# Patient Record
Sex: Male | Born: 1960 | Race: White | Hispanic: No | Marital: Married | Smoking: Never smoker
Health system: Southern US, Community
[De-identification: ages and names within clinical notes are randomized; demographics above are authoritative.]

## PROBLEM LIST (undated history)

## (undated) DIAGNOSIS — M1990 Unspecified osteoarthritis, unspecified site: Secondary | ICD-10-CM

## (undated) DIAGNOSIS — M549 Dorsalgia, unspecified: Secondary | ICD-10-CM

## (undated) DIAGNOSIS — I10 Essential (primary) hypertension: Secondary | ICD-10-CM

## (undated) DIAGNOSIS — K219 Gastro-esophageal reflux disease without esophagitis: Secondary | ICD-10-CM

## (undated) DIAGNOSIS — K7030 Alcoholic cirrhosis of liver without ascites: Secondary | ICD-10-CM

## (undated) DIAGNOSIS — F419 Anxiety disorder, unspecified: Secondary | ICD-10-CM

## (undated) DIAGNOSIS — I81 Portal vein thrombosis: Secondary | ICD-10-CM

## (undated) DIAGNOSIS — Z9109 Other allergy status, other than to drugs and biological substances: Secondary | ICD-10-CM

## (undated) DIAGNOSIS — E039 Hypothyroidism, unspecified: Secondary | ICD-10-CM

## (undated) DIAGNOSIS — F1011 Alcohol abuse, in remission: Secondary | ICD-10-CM

## (undated) DIAGNOSIS — I2510 Atherosclerotic heart disease of native coronary artery without angina pectoris: Secondary | ICD-10-CM

## (undated) DIAGNOSIS — E079 Disorder of thyroid, unspecified: Secondary | ICD-10-CM

## (undated) HISTORY — PX: ANKLE FRACTURE SURGERY: SHX122

## (undated) HISTORY — PX: FRACTURE SURGERY: SHX138

## (undated) HISTORY — PX: EYE SURGERY: SHX253

---

## 2009-06-10 ENCOUNTER — Ambulatory Visit: Payer: Self-pay | Admitting: Family Medicine

## 2009-06-13 ENCOUNTER — Ambulatory Visit: Payer: Self-pay | Admitting: Family Medicine

## 2009-07-21 ENCOUNTER — Ambulatory Visit: Payer: Self-pay | Admitting: Family Medicine

## 2010-12-14 ENCOUNTER — Ambulatory Visit: Payer: Self-pay | Admitting: Nephrology

## 2012-02-02 DIAGNOSIS — I81 Portal vein thrombosis: Secondary | ICD-10-CM

## 2012-02-02 HISTORY — DX: Portal vein thrombosis: I81

## 2012-04-17 ENCOUNTER — Ambulatory Visit: Payer: Self-pay | Admitting: Family Medicine

## 2012-04-19 ENCOUNTER — Ambulatory Visit: Payer: Self-pay | Admitting: Family Medicine

## 2012-05-30 DIAGNOSIS — K219 Gastro-esophageal reflux disease without esophagitis: Secondary | ICD-10-CM | POA: Diagnosis present

## 2012-05-30 DIAGNOSIS — K7030 Alcoholic cirrhosis of liver without ascites: Secondary | ICD-10-CM | POA: Diagnosis present

## 2012-05-30 DIAGNOSIS — I10 Essential (primary) hypertension: Secondary | ICD-10-CM | POA: Diagnosis present

## 2012-05-30 DIAGNOSIS — E785 Hyperlipidemia, unspecified: Secondary | ICD-10-CM | POA: Insufficient documentation

## 2012-05-30 DIAGNOSIS — E039 Hypothyroidism, unspecified: Secondary | ICD-10-CM | POA: Diagnosis present

## 2012-10-27 ENCOUNTER — Ambulatory Visit: Payer: Self-pay | Admitting: Family Medicine

## 2012-12-18 DIAGNOSIS — I81 Portal vein thrombosis: Secondary | ICD-10-CM | POA: Diagnosis present

## 2013-04-25 ENCOUNTER — Ambulatory Visit: Payer: Self-pay | Admitting: Physician Assistant

## 2014-03-14 ENCOUNTER — Ambulatory Visit: Payer: Self-pay | Admitting: Family Medicine

## 2014-05-01 DIAGNOSIS — I8500 Esophageal varices without bleeding: Secondary | ICD-10-CM | POA: Diagnosis present

## 2015-01-14 ENCOUNTER — Other Ambulatory Visit: Payer: Self-pay | Admitting: Orthopedic Surgery

## 2015-01-14 DIAGNOSIS — M2391 Unspecified internal derangement of right knee: Secondary | ICD-10-CM

## 2015-01-14 DIAGNOSIS — M25562 Pain in left knee: Principal | ICD-10-CM

## 2015-01-14 DIAGNOSIS — G8929 Other chronic pain: Secondary | ICD-10-CM

## 2015-01-14 DIAGNOSIS — M2392 Unspecified internal derangement of left knee: Secondary | ICD-10-CM

## 2015-01-14 DIAGNOSIS — M25561 Pain in right knee: Secondary | ICD-10-CM

## 2015-02-06 ENCOUNTER — Ambulatory Visit: Payer: Self-pay

## 2015-02-21 ENCOUNTER — Encounter
Admission: RE | Admit: 2015-02-21 | Discharge: 2015-02-21 | Disposition: A | Discharge status: Home / Self Care | Payer: BLUE CROSS/BLUE SHIELD | Source: Ambulatory Visit | Attending: Unknown Physician Specialty | Admitting: Unknown Physician Specialty

## 2015-02-21 DIAGNOSIS — Z01812 Encounter for preprocedural laboratory examination: Secondary | ICD-10-CM | POA: Insufficient documentation

## 2015-02-21 DIAGNOSIS — Z01810 Encounter for preprocedural cardiovascular examination: Secondary | ICD-10-CM | POA: Insufficient documentation

## 2015-02-21 DIAGNOSIS — I10 Essential (primary) hypertension: Secondary | ICD-10-CM | POA: Diagnosis not present

## 2015-02-21 HISTORY — DX: Portal vein thrombosis: I81

## 2015-02-21 HISTORY — DX: Alcohol abuse, in remission: F10.11

## 2015-02-21 HISTORY — DX: Disorder of thyroid, unspecified: E07.9

## 2015-02-21 HISTORY — DX: Atherosclerotic heart disease of native coronary artery without angina pectoris: I25.10

## 2015-02-21 HISTORY — DX: Gastro-esophageal reflux disease without esophagitis: K21.9

## 2015-02-21 HISTORY — DX: Alcoholic cirrhosis of liver without ascites: K70.30

## 2015-02-21 HISTORY — DX: Essential (primary) hypertension: I10

## 2015-02-21 HISTORY — DX: Unspecified osteoarthritis, unspecified site: M19.90

## 2015-02-21 HISTORY — DX: Anxiety disorder, unspecified: F41.9

## 2015-02-21 LAB — CBC
HCT: 44.1 % (ref 40.0–52.0)
Hemoglobin: 14.6 g/dL (ref 13.0–18.0)
MCH: 31.1 pg (ref 26.0–34.0)
MCHC: 33.1 g/dL (ref 32.0–36.0)
MCV: 93.9 fL (ref 80.0–100.0)
PLATELETS: 96 10*3/uL — AB (ref 150–440)
RBC: 4.7 MIL/uL (ref 4.40–5.90)
RDW: 14.4 % (ref 11.5–14.5)
WBC: 5 10*3/uL (ref 3.8–10.6)

## 2015-02-21 LAB — BASIC METABOLIC PANEL
Anion gap: 5 (ref 5–15)
BUN: 12 mg/dL (ref 6–20)
CO2: 23 mmol/L (ref 22–32)
CREATININE: 0.89 mg/dL (ref 0.61–1.24)
Calcium: 8.9 mg/dL (ref 8.9–10.3)
Chloride: 112 mmol/L — ABNORMAL HIGH (ref 101–111)
GFR calc Af Amer: >60 mL/min (ref >60)
Glucose, Bld: 84 mg/dL (ref 65–99)
Potassium: 4.1 mmol/L (ref 3.5–5.1)
SODIUM: 140 mmol/L (ref 135–145)

## 2015-02-21 LAB — HEPATIC FUNCTION PANEL
ALT: 26 U/L (ref 17–63)
AST: 33 U/L (ref 15–41)
Albumin: 3.4 g/dL — ABNORMAL LOW (ref 3.5–5.0)
Alkaline Phosphatase: 77 U/L (ref 38–126)
BILIRUBIN INDIRECT: 1.5 mg/dL — AB (ref 0.3–0.9)
Bilirubin, Direct: 0.3 mg/dL (ref 0.1–0.5)
TOTAL PROTEIN: 6.4 g/dL — AB (ref 6.5–8.1)
Total Bilirubin: 1.8 mg/dL — ABNORMAL HIGH (ref 0.3–1.2)

## 2015-02-21 NOTE — Pre-Procedure Instructions (Signed)
Dr. Andree Elk notified of patient medical history and medical clearance requested.

## 2015-02-21 NOTE — Pre-Procedure Instructions (Signed)
Dr. Franchot Mimes, Jr. office notified of medical clearance request (spoke to [facility]).

## 2015-02-21 NOTE — Patient Instructions (Signed)
  Your procedure is scheduled [id number] 25, 2917 (Wednesday) Report to Day Surgery.[facility]) Second Floor To find out your arrival time please call [phone] between 1PM - 3PM on February 25, 2015 (Tuesday).  Remember: Instructions that are not followed completely may result in serious medical risk, up to and including death, or upon the discretion of your surgeon and anesthesiologist your surgery may need to be rescheduled.    __x__ 1. Do not eat food or drink liquids after midnight. No gum chewing or hard candies.     __x__ 2. No Alcohol for 24 hours before or after surgery.   ____ 3. Bring all medications with you on the day of surgery if instructed.    __x__ 4. Notify your doctor if there is any change in your medical condition     (cold, fever, infections).     Do not wear jewelry, make-up, hairpins, clips or nail polish.  Do not wear lotions, powders, or perfumes. You may wear deodorant.  Do not shave 48 hours prior to surgery. Men may shave face and neck.  Do not bring valuables to the hospital.    [facility] is not responsible for any belongings or valuables.               Contacts, dentures or bridgework may not be worn into surgery.  Leave your suitcase in the car. After surgery it may be brought to your room.  For patients admitted to the hospital, discharge time is determined by your                treatment team.   Patients discharged the day of surgery will not be allowed to drive home.   Please read over the following fact sheets that you were given:   Surgical Site Infection Prevention   ____ Take these medicines the morning of surgery with A SIP OF WATER:    1. Gabapentin  2. Magnesium  3.   4.  5.  6.  ____ Fleet Enema (as directed)   __x__ Use CHG Soap as directed  ____ Use inhalers on the day of surgery  ____ Stop metformin 2 days prior to surgery    ____ Take 1/2 of usual insulin dose the night before surgery and none on the morning of  surgery.   ____ Stop Coumadin/Plavix/aspirin on   ____ Stop Anti-inflammatories on    ____ Stop supplements until after surgery.    ____ Bring C-Pap to the hospital.

## 2015-02-24 NOTE — Pre-Procedure Instructions (Signed)
NOTE RECEIVED FROM DR [facility] THAT PATIENT NEEDS CLEARANCE FROM HEPATOLOGIST. SPOKE [place] WILL TRY TO GET NOTE.

## 2015-02-25 NOTE — Pre-Procedure Instructions (Signed)
Medical clearance received from Dr. Claybon Jabs, [place].

## 2015-02-26 ENCOUNTER — Ambulatory Visit
Admission: RE | Admit: 2015-02-26 | Discharge: 2015-02-26 | Disposition: A | Discharge status: Home / Self Care | Payer: BLUE CROSS/BLUE SHIELD | Source: Ambulatory Visit | Attending: Unknown Physician Specialty | Admitting: Unknown Physician Specialty

## 2015-02-26 ENCOUNTER — Encounter: Payer: Self-pay | Admitting: *Deleted

## 2015-02-26 ENCOUNTER — Ambulatory Visit: Payer: BLUE CROSS/BLUE SHIELD | Admitting: Anesthesiology

## 2015-02-26 ENCOUNTER — Encounter
Admission: RE | Disposition: A | Discharge status: Home / Self Care | Payer: Self-pay | Source: Ambulatory Visit | Attending: Unknown Physician Specialty

## 2015-02-26 DIAGNOSIS — E785 Hyperlipidemia, unspecified: Secondary | ICD-10-CM | POA: Diagnosis not present

## 2015-02-26 DIAGNOSIS — M23232 Derangement of other medial meniscus due to old tear or injury, left knee: Secondary | ICD-10-CM | POA: Insufficient documentation

## 2015-02-26 DIAGNOSIS — F1010 Alcohol abuse, uncomplicated: Secondary | ICD-10-CM | POA: Insufficient documentation

## 2015-02-26 DIAGNOSIS — K7030 Alcoholic cirrhosis of liver without ascites: Secondary | ICD-10-CM | POA: Insufficient documentation

## 2015-02-26 DIAGNOSIS — Z79899 Other long term (current) drug therapy: Secondary | ICD-10-CM | POA: Diagnosis not present

## 2015-02-26 DIAGNOSIS — I10 Essential (primary) hypertension: Secondary | ICD-10-CM | POA: Insufficient documentation

## 2015-02-26 DIAGNOSIS — Z9841 Cataract extraction status, right eye: Secondary | ICD-10-CM | POA: Insufficient documentation

## 2015-02-26 DIAGNOSIS — Z8249 Family history of ischemic heart disease and other diseases of the circulatory system: Secondary | ICD-10-CM | POA: Diagnosis not present

## 2015-02-26 DIAGNOSIS — M1990 Unspecified osteoarthritis, unspecified site: Secondary | ICD-10-CM | POA: Insufficient documentation

## 2015-02-26 DIAGNOSIS — F419 Anxiety disorder, unspecified: Secondary | ICD-10-CM | POA: Diagnosis not present

## 2015-02-26 DIAGNOSIS — I2510 Atherosclerotic heart disease of native coronary artery without angina pectoris: Secondary | ICD-10-CM | POA: Diagnosis not present

## 2015-02-26 DIAGNOSIS — S83242A Other tear of medial meniscus, current injury, left knee, initial encounter: Secondary | ICD-10-CM | POA: Diagnosis present

## 2015-02-26 DIAGNOSIS — K219 Gastro-esophageal reflux disease without esophagitis: Secondary | ICD-10-CM | POA: Diagnosis not present

## 2015-02-26 DIAGNOSIS — M23222 Derangement of posterior horn of medial meniscus due to old tear or injury, left knee: Secondary | ICD-10-CM | POA: Diagnosis not present

## 2015-02-26 DIAGNOSIS — Z86718 Personal history of other venous thrombosis and embolism: Secondary | ICD-10-CM | POA: Insufficient documentation

## 2015-02-26 DIAGNOSIS — E079 Disorder of thyroid, unspecified: Secondary | ICD-10-CM | POA: Diagnosis not present

## 2015-02-26 DIAGNOSIS — Z8261 Family history of arthritis: Secondary | ICD-10-CM | POA: Diagnosis not present

## 2015-02-26 DIAGNOSIS — E039 Hypothyroidism, unspecified: Secondary | ICD-10-CM | POA: Insufficient documentation

## 2015-02-26 HISTORY — PX: KNEE ARTHROSCOPY: SHX127

## 2015-02-26 SURGERY — ARTHROSCOPY, KNEE
Anesthesia: General | Site: Knee | Laterality: Left | Wound class: Clean

## 2015-02-26 MED ORDER — BUPIVACAINE HCL (PF) 0.5 % IJ SOLN
INTRAMUSCULAR | Status: AC
  Filled 2015-02-26: qty 30

## 2015-02-26 MED ORDER — FENTANYL CITRATE (PF) 100 MCG/2ML IJ SOLN: 25.0000 ug | INTRAMUSCULAR | Status: DC | PRN

## 2015-02-26 MED ORDER — BUPIVACAINE HCL (PF) 0.5 % IJ SOLN
INTRAMUSCULAR | Status: DC | PRN
Start: 2015-02-26 — End: 2015-02-26
  Administered 2015-02-26: 20 mL

## 2015-02-26 MED ORDER — HYDROCODONE-ACETAMINOPHEN 5-325 MG PO TABS
ORAL_TABLET | ORAL | Status: AC
  Filled 2015-02-26: qty 1

## 2015-02-26 MED ORDER — FENTANYL CITRATE (PF) 100 MCG/2ML IJ SOLN
INTRAMUSCULAR | Status: DC | PRN
  Administered 2015-02-26: 50 ug via INTRAVENOUS

## 2015-02-26 MED ORDER — HYDROCODONE-ACETAMINOPHEN 5-325 MG PO TABS
1.0000 | ORAL_TABLET | Freq: Four times a day (QID) | ORAL | Status: DC | PRN
  Administered 2015-02-26: 1 via ORAL

## 2015-02-26 MED ORDER — ONDANSETRON HCL 4 MG/2ML IJ SOLN
INTRAMUSCULAR | Status: DC | PRN
  Administered 2015-02-26: 4 mg via INTRAVENOUS

## 2015-02-26 MED ORDER — ACETAMINOPHEN 10 MG/ML IV SOLN
INTRAVENOUS | Status: DC | PRN
  Administered 2015-02-26: 1000 mg via INTRAVENOUS

## 2015-02-26 MED ORDER — EPHEDRINE SULFATE 50 MG/ML IJ SOLN
INTRAMUSCULAR | Status: DC | PRN
  Administered 2015-02-26: 10 mg via INTRAVENOUS

## 2015-02-26 MED ORDER — FAMOTIDINE 20 MG PO TABS
ORAL_TABLET | ORAL | Status: AC
  Administered 2015-02-26: 20 mg via ORAL
  Filled 2015-02-26: qty 1

## 2015-02-26 MED ORDER — LIDOCAINE HCL (CARDIAC) 20 MG/ML IV SOLN
INTRAVENOUS | Status: DC | PRN
  Administered 2015-02-26: 60 mg via INTRAVENOUS

## 2015-02-26 MED ORDER — MIDAZOLAM HCL 5 MG/5ML IJ SOLN
INTRAMUSCULAR | Status: DC | PRN
  Administered 2015-02-26: 2 mg via INTRAVENOUS

## 2015-02-26 MED ORDER — NORCO 5-325 MG PO TABS: 1.0000 | ORAL_TABLET | Freq: Four times a day (QID) | ORAL | Status: DC | PRN

## 2015-02-26 MED ORDER — LACTATED RINGERS IV SOLN
INTRAVENOUS | Status: DC
  Administered 2015-02-26: 07:00:00 via INTRAVENOUS

## 2015-02-26 MED ORDER — FAMOTIDINE 20 MG PO TABS
20.0000 mg | ORAL_TABLET | Freq: Once | ORAL | Status: AC
  Administered 2015-02-26: 20 mg via ORAL

## 2015-02-26 MED ORDER — PROMETHAZINE HCL 25 MG/ML IJ SOLN: 6.2500 mg | INTRAMUSCULAR | Status: DC | PRN

## 2015-02-26 MED ORDER — ACETAMINOPHEN 10 MG/ML IV SOLN
INTRAVENOUS | Status: AC
  Filled 2015-02-26: qty 100

## 2015-02-26 SURGICAL SUPPLY — 38 items
ARTHROWAND PARAGON T2 (SURGICAL WAND)
BLADE ABRADER 4.5 (BLADE) IMPLANT
BLADE FULL RADIUS 3.5 (BLADE) ×2 IMPLANT
BLADE SHAVER 4.5X7 STR FR (MISCELLANEOUS) ×2 IMPLANT
BNDG ESMARK 6X12 TAN STRL LF (GAUZE/BANDAGES/DRESSINGS) ×2 IMPLANT
BUR ABRADER 4.0 W/FLUTE AQUA (MISCELLANEOUS) IMPLANT
BURR ABRADER 4.0 W/FLUTE AQUA (MISCELLANEOUS)
CHLORAPREP W/TINT 26ML (MISCELLANEOUS) ×2 IMPLANT
DRAPE LEGGINS SURG 28X43 STRL (DRAPES) ×2 IMPLANT
GAUZE SPONGE 4X4 12PLY STRL (GAUZE/BANDAGES/DRESSINGS) ×2 IMPLANT
GLOVE BIO SURGEON STRL SZ7.5 (GLOVE) ×2 IMPLANT
GLOVE BIO SURGEON STRL SZ8 (GLOVE) ×2 IMPLANT
GLOVE INDICATOR 8.0 STRL GRN (GLOVE) ×2 IMPLANT
GOWN STRL REUS W/ TWL LRG LVL3 (GOWN DISPOSABLE) ×1 IMPLANT
GOWN STRL REUS W/TWL LRG LVL3 (GOWN DISPOSABLE) ×1
GOWN STRL REUS W/TWL LRG LVL4 (GOWN DISPOSABLE) ×2 IMPLANT
IV LACTATED RINGER IRRG 3000ML (IV SOLUTION) ×2
IV LR IRRIG 3000ML ARTHROMATIC (IV SOLUTION) ×2 IMPLANT
KIT RM TURNOVER STRD PROC AR (KITS) ×2 IMPLANT
MANIFOLD 4PT FOR NEPTUNE1 (MISCELLANEOUS) ×2 IMPLANT
PACK ARTHROSCOPY KNEE (MISCELLANEOUS) ×2 IMPLANT
PADDING CAST 4IN STRL (MISCELLANEOUS)
PADDING CAST BLEND 4X4 STRL (MISCELLANEOUS) IMPLANT
SET TUBE SUCT SHAVER OUTFL 24K (TUBING) ×2 IMPLANT
SET TUBE TIP INTRA-ARTICULAR (MISCELLANEOUS) ×2 IMPLANT
SOL PREP PVP 2OZ (MISCELLANEOUS) ×2
SOLUTION PREP PVP 2OZ (MISCELLANEOUS) ×1 IMPLANT
SUT ETH BLK MONO 3 0 FS 1 12/B (SUTURE) ×2 IMPLANT
SUT ETHILON 3-0 FS-10 30 BLK (SUTURE) ×2
SUTURE EHLN 3-0 FS-10 30 BLK (SUTURE) ×1 IMPLANT
TAPE MICROFOAM 4IN (TAPE) ×2 IMPLANT
TUBING ARTHRO INFLOW-ONLY STRL (TUBING) ×2 IMPLANT
WAND 30 DEG SABER W/CORD (SURGICAL WAND) IMPLANT
WAND ARTHRO PARAGON T2 (SURGICAL WAND) IMPLANT
WAND COVAC 50 IFS (MISCELLANEOUS) IMPLANT
WAND HAND CNTRL MULTIVAC 50 (MISCELLANEOUS) IMPLANT
WAND HAND CNTRL MULTIVAC 90 (MISCELLANEOUS) IMPLANT
WRAP KNEE W/COLD PACKS 25.5X14 (SOFTGOODS) ×2 IMPLANT

## 2015-02-26 NOTE — Op Note (Signed)
Patient: Shaun Weaver, Shaun Weaver  Preoperative diagnosis: Torn medial meniscus left knee  Postop diagnosis: Same  Operation: Arthroscopic partial medial meniscectomy left knee  Surgeon: Vilinda Flake, MD  Anesthesia: Gen.   History: Patient's had a long history of left knee pain.  The plain films revealed minimal joint space narrowing medially on the left .  The patient had an MRI which revealed a torn medial meniscus.The patient was scheduled for surgery due to persistent discomfort despite conservative treatment.  The patient was taken the operating room where satisfactory general anesthesia was achieved. A tourniquet and leg holder were was applied to the left thigh. A well leg support was applied to the nonoperative extremity. The left knee was prepped and draped in usual fashion for an arthroscopic procedure. An inflow cannula was introduced superomedially. The joint was distended with lactated Ringer's. Scope was introduced through an inferolateral puncture wound and a probe through an inferomedial puncture wound. Inspection of the medial compartment revealed  a complex tear of the posterior horn and middle third of the medial meniscus. I went ahead and resected the tear using combination of basket biters and a motorized resector. The patient did not have any medial compartment chondral lesions. Inspection of the intercondylar notch revealed some attenuation of the anterior cruciate ligament. Inspection of the the lateral compartment revealed no meniscal are chondral pathology.   Trochlear groove was inspected and appeared to be fairly smooth.  Retropatellar surface was smooth. The patella seemed to track fairly well.  The instruments were removed from the joint at this time. The puncture wounds were closed with 3-0 nylon in vertical mattress fashion. I injected each puncture wound with several cc of half percent Marcaine without epinephrine. Betadine was applied the wounds followed by sterile  dressing. An ice pack was applied to the right knee. The patient was awakened and transferred to the stretcher bed. The patient was taken to the recovery room in satisfactory condition.  The tourniquet was not inflated during the course of the procedure. Blood loss was negligible.

## 2015-02-26 NOTE — Anesthesia Procedure Notes (Signed)
Procedure Name: LMA Insertion Date/Time: 02/26/2015 7:45 AM Performed by: Delaney Meigs Pre-anesthesia Checklist: Patient identified, Emergency Drugs available, Suction available, Patient being monitored and Timeout performed Patient Re-evaluated:Patient Re-evaluated prior to inductionOxygen Delivery Method: Circle system utilized and Simple face mask Preoxygenation: Pre-oxygenation with 100% oxygen Intubation Type: IV induction Ventilation: Mask ventilation without difficulty LMA Size: 4.5 Number of attempts: 1 Placement Confirmation: breath sounds checked- equal and bilateral and positive ETCO2 Tube secured with: Tape

## 2015-02-26 NOTE — H&P (Signed)
  H and P reviewed. No changes. Uploaded at later date. 

## 2015-02-26 NOTE — Anesthesia Preprocedure Evaluation (Signed)
Anesthesia Evaluation  Patient identified by MRN, date of birth, ID band Patient awake    Reviewed: Allergy & Precautions, H&P , NPO status , Patient's Chart, lab work & pertinent test results, reviewed documented beta blocker date and time   History of Anesthesia Complications Negative for: history of anesthetic complications  Airway Mallampati: I  TM Distance: >3 FB Neck ROM: full    Dental no notable dental hx. (+) Missing, Teeth Intact   Pulmonary neg pulmonary ROS,    Pulmonary exam normal breath sounds clear to auscultation       Cardiovascular Exercise Tolerance: Good hypertension, (-) angina+ CAD  (-) Past MI, (-) Cardiac Stents and (-) CABG Normal cardiovascular exam(-) dysrhythmias (-) Valvular Problems/Murmurs Rhythm:regular Rate:Normal     Neuro/Psych negative neurological ROS  negative psych ROS   GI/Hepatic GERD  ,(+) Cirrhosis       ,   Endo/Other  neg diabetesHypothyroidism   Renal/GU negative Renal ROS  negative genitourinary   Musculoskeletal   Abdominal   Peds  Hematology negative hematology ROS (+)   Anesthesia Other Findings Past Medical History:   Anxiety                                                      H/O alcohol abuse                                            Arthritis                                                    Cirrhosis, alcoholic (HCC)                                   Coronary artery disease                                      GERD (gastroesophageal reflux disease)                       Hypertension                                                 Thyroid disease                                              Deep vein thrombosis of portal vein             2014           Comment:[facility]   Reproductive/Obstetrics negative OB ROS  Anesthesia Physical Anesthesia Plan  ASA: III  Anesthesia Plan: General    Post-op Pain Management:    Induction:   Airway Management Planned:   Additional Equipment:   Intra-op Plan:   Post-operative Plan:   Informed Consent: I have reviewed the patients History and Physical, chart, labs and discussed the procedure including the risks, benefits and alternatives for the proposed anesthesia with the patient or authorized representative who has indicated his/her understanding and acceptance.   Dental Advisory Given  Plan Discussed with: Anesthesiologist, CRNA and Surgeon  Anesthesia Plan Comments:         Anesthesia Quick Evaluation

## 2015-02-26 NOTE — Discharge Instructions (Signed)
° °  [facility]  °Kylea Berrong B. Braden Cimo, Jr., M.D. [phone]  ° °KNEE ARTHROSCOPY POST OPERATION INSTRUCTIONS: ° °PLEASE READ THESE INSTRUCTIONS ABOUT POST OPERATION CARE. THEY WILL ANSWER MOST OF YOUR QUESTIONS.  °You have been given a prescription for pain. Please take as directed for pain.  °You can walk, keeping the knee slightly stiff-avoid doing too much bending the first day. (if ACL reconstruction is performed, keep brace locked in extension when walking.)  °You will use crutches or cane if needed. Can weight bear as tolerated  °Plan to take three to four days off from work. You can resume work when you are comfortable. (This can be a week or more, depending on the type of work you do.)  °To reduce pain and swelling, place one to two pillows under the knee the first two or three days when sitting or lying. An ice pack may be placed on top of the area over the dressing. Instructions for making homemade icepack are as follow:  °Flexible homemade alcohol water ice pack  °2 cups water  °1 cup rubbing alcohol  °food coloring for the blue tint (optional)  °2 zip-top bags - gallon-size  °Mix the water and alcohol together in one of your zip-top bags and add food coloring. Release as much air as possible and seal the bag. Place in freezer for at least 12 hours.  °The small incisions in your knee are closed with nylon stitches. They will be removed in the office.  °The bulky dressing may be removed in the third day after surgery. (If ACL surgery-DO NOT REMOVE BANDAGES). Put a waterproof band-aid over each stitch. Do not put any creams or ointments on wounds. You may shower at this time, but change waterproof band-aids after showering. KEEP INCISIONS CLEAN AND DRY UNTIL YOU RETURN TO THE OFFICE.  °Sometimes the operative area remains somewhat painful and swollen for several weeks. This is usually nothing to worry about, but call if you have any excessive symptoms, especially  fever. It is not unusual to have a low grade fever of 99 degrees for the first few days. If persist after 3-4 days call the office. It is not uncommon for the pain to be a little worse on the third day after surgery.  °Begin doing gentle exercises right away. They will be limited by the amount of pain and swelling you have.  Exercising will reduce the swelling, increase motion, and prevent muscle weakness. Exercises: Straight leg raising and gentle knee bending.  °Take 81 milligram aspirin twice a day for 2 weeks after meals or milk. This along with elevation will help reduce the possibility of phlebitis in your operated leg.  °Avoid strenuous athletics for a minimum of 4 to 6 weeks after arthroscopic surgery (approximately five months if ACL surgery).  °If the surgery included ACL reconstruction the brace that is supplied to the extremity post surgery is to be locked in extension when you are asleep and is to be locked in extension when you are ambulating. It can be unlocked for exercises or sitting.  °Keep your post surgery appointment that has been made for you. If you do not remember the date call [phone]. Your follow up appointment should be between 7-10 days.  ° °

## 2015-02-26 NOTE — Transfer of Care (Signed)
Immediate Anesthesia Transfer of Care Note  Patient: Pryor Curia Caniglia  Procedure(s) Performed: Procedure(s): ARTHROSCOPY Left  KNEE partial medial menisectomy (Left)  Patient Location: PACU  Anesthesia Type:General  Level of Consciousness: sedated  Airway & Oxygen Therapy: Patient Spontanous Breathing and Patient connected to face mask oxygen  Post-op Assessment: Report given to RN and Post -op Vital signs reviewed and stable  Post vital signs: Reviewed and stable  Last Vitals:  Filed Vitals:   02/26/15 0606 02/26/15 0850  BP: 127/76 113/78  Pulse: 58 54  Temp: 36.5 C 36.3 C  Resp: 18 10    Complications: No apparent anesthesia complications

## 2015-02-27 NOTE — Anesthesia Postprocedure Evaluation (Signed)
Anesthesia Post Note  Patient: Shaun Weaver  Procedure(s) Performed: Procedure(s) (LRB): ARTHROSCOPY Left  KNEE partial medial menisectomy (Left)  Patient location during evaluation: PACU Anesthesia Type: General Level of consciousness: awake and alert Pain management: pain level controlled Vital Signs Assessment: post-procedure vital signs reviewed and stable Respiratory status: spontaneous breathing, nonlabored ventilation, respiratory function stable and patient connected to nasal cannula oxygen Cardiovascular status: blood pressure returned to baseline and stable Postop Assessment: no signs of nausea or vomiting Anesthetic complications: no    Last Vitals:  Filed Vitals:   02/26/15 0948 02/26/15 1000  BP: 124/87 120/75  Pulse: 55 61  Temp: 35.9 C   Resp: 16     Last Pain:  Filed Vitals:   02/26/15 1019  PainSc: 6                  Martha Clan

## 2015-03-28 ENCOUNTER — Encounter
Admission: RE | Admit: 2015-03-28 | Discharge: 2015-03-28 | Disposition: A | Discharge status: Home / Self Care | Payer: BLUE CROSS/BLUE SHIELD | Source: Ambulatory Visit | Attending: Unknown Physician Specialty | Admitting: Unknown Physician Specialty

## 2015-03-28 DIAGNOSIS — E079 Disorder of thyroid, unspecified: Secondary | ICD-10-CM | POA: Diagnosis not present

## 2015-03-28 DIAGNOSIS — X58XXXA Exposure to other specified factors, initial encounter: Secondary | ICD-10-CM | POA: Diagnosis not present

## 2015-03-28 DIAGNOSIS — Z8261 Family history of arthritis: Secondary | ICD-10-CM | POA: Diagnosis not present

## 2015-03-28 DIAGNOSIS — I2510 Atherosclerotic heart disease of native coronary artery without angina pectoris: Secondary | ICD-10-CM | POA: Diagnosis not present

## 2015-03-28 DIAGNOSIS — S83241A Other tear of medial meniscus, current injury, right knee, initial encounter: Secondary | ICD-10-CM | POA: Diagnosis not present

## 2015-03-28 DIAGNOSIS — M2391 Unspecified internal derangement of right knee: Secondary | ICD-10-CM | POA: Diagnosis not present

## 2015-03-28 DIAGNOSIS — E785 Hyperlipidemia, unspecified: Secondary | ICD-10-CM | POA: Diagnosis not present

## 2015-03-28 DIAGNOSIS — Z86718 Personal history of other venous thrombosis and embolism: Secondary | ICD-10-CM | POA: Diagnosis not present

## 2015-03-28 DIAGNOSIS — F419 Anxiety disorder, unspecified: Secondary | ICD-10-CM | POA: Diagnosis not present

## 2015-03-28 DIAGNOSIS — Z8249 Family history of ischemic heart disease and other diseases of the circulatory system: Secondary | ICD-10-CM | POA: Diagnosis not present

## 2015-03-28 DIAGNOSIS — K219 Gastro-esophageal reflux disease without esophagitis: Secondary | ICD-10-CM | POA: Diagnosis not present

## 2015-03-28 DIAGNOSIS — Y939 Activity, unspecified: Secondary | ICD-10-CM | POA: Diagnosis not present

## 2015-03-28 DIAGNOSIS — Z9841 Cataract extraction status, right eye: Secondary | ICD-10-CM | POA: Diagnosis not present

## 2015-03-28 DIAGNOSIS — K7460 Unspecified cirrhosis of liver: Secondary | ICD-10-CM | POA: Diagnosis not present

## 2015-03-28 DIAGNOSIS — M1990 Unspecified osteoarthritis, unspecified site: Secondary | ICD-10-CM | POA: Diagnosis not present

## 2015-03-28 DIAGNOSIS — I10 Essential (primary) hypertension: Secondary | ICD-10-CM | POA: Diagnosis not present

## 2015-03-28 DIAGNOSIS — Z79899 Other long term (current) drug therapy: Secondary | ICD-10-CM | POA: Diagnosis not present

## 2015-03-28 HISTORY — DX: Other allergy status, other than to drugs and biological substances: Z91.09

## 2015-03-28 HISTORY — DX: Dorsalgia, unspecified: M54.9

## 2015-03-28 HISTORY — DX: Hypothyroidism, unspecified: E03.9

## 2015-03-28 LAB — POTASSIUM: POTASSIUM: 4.1 mmol/L (ref 3.5–5.1)

## 2015-03-28 NOTE — Patient Instructions (Addendum)
  Your procedure is scheduled on: 03/31/15 Mon Report to Day Surgery.2nd floor medical mall at 9:15 am on Mon. .  Remember: Instructions that are not followed completely may result in serious medical risk, up to and including death, or upon the discretion of your surgeon and anesthesiologist your surgery may need to be rescheduled.    __x__ 1. Do not eat food or drink liquids after midnight. No gum chewing or hard candies.     ____ 2. No Alcohol for 24 hours before or after surgery.   ____ 3. Bring all medications with you on the day of surgery if instructed.    __x__ 4. Notify your doctor if there is any change in your medical condition     (cold, fever, infections).     Do not wear jewelry, make-up, hairpins, clips or nail polish.  Do not wear lotions, powders, or perfumes. You may wear deodorant.  Do not shave 48 hours prior to surgery. Men may shave face and neck.  Do not bring valuables to the hospital.    [facility] is not responsible for any belongings or valuables.               Contacts, dentures or bridgework may not be worn into surgery.  Leave your suitcase in the car. After surgery it may be brought to your room.  For patients admitted to the hospital, discharge time is determined by your                treatment team.   Patients discharged the day of surgery will not be allowed to drive home.   Please read over the following fact sheets that you were given:      _x___ Take these medicines the morning of surgery with A SIP OF WATER:    1. gabapentin (NEURONTIN) 300 MG capsule   2. levothyroxine (SYNTHROID, LEVOTHROID) 100 MCG tablet  3. nadolol (CORGARD) 40 MG tablet  4.omeprazole (PRILOSEC) 20 MG capsule  5.  6.  ____ Fleet Enema (as directed)   _x___ Use CHG Soap as directed  ____ Use inhalers on the day of surgery  ____ Stop metformin 2 days prior to surgery    ____ Take 1/2 of usual insulin dose the night before surgery and none on the morning of  surgery.   ____ Stop Coumadin/Plavix/aspirin on   __x__ Stop Anti-inflammatories on No aspirin or ibuprofen until after surgery may take Tylenol as needed   ____ Stop supplements until after surgery.    ____ Bring C-Pap to the hospital.

## 2015-03-31 ENCOUNTER — Ambulatory Visit
Admission: RE | Admit: 2015-03-31 | Discharge: 2015-03-31 | Disposition: A | Discharge status: Home / Self Care | Payer: BLUE CROSS/BLUE SHIELD | Source: Ambulatory Visit | Attending: Unknown Physician Specialty | Admitting: Unknown Physician Specialty

## 2015-03-31 ENCOUNTER — Encounter: Payer: Self-pay | Admitting: *Deleted

## 2015-03-31 ENCOUNTER — Ambulatory Visit: Payer: BLUE CROSS/BLUE SHIELD | Admitting: Anesthesiology

## 2015-03-31 ENCOUNTER — Encounter
Admission: RE | Disposition: A | Discharge status: Home / Self Care | Payer: Self-pay | Source: Ambulatory Visit | Attending: Unknown Physician Specialty

## 2015-03-31 DIAGNOSIS — M2391 Unspecified internal derangement of right knee: Secondary | ICD-10-CM | POA: Insufficient documentation

## 2015-03-31 DIAGNOSIS — Z9841 Cataract extraction status, right eye: Secondary | ICD-10-CM | POA: Insufficient documentation

## 2015-03-31 DIAGNOSIS — E079 Disorder of thyroid, unspecified: Secondary | ICD-10-CM | POA: Insufficient documentation

## 2015-03-31 DIAGNOSIS — S83241A Other tear of medial meniscus, current injury, right knee, initial encounter: Secondary | ICD-10-CM | POA: Diagnosis not present

## 2015-03-31 DIAGNOSIS — Z8249 Family history of ischemic heart disease and other diseases of the circulatory system: Secondary | ICD-10-CM | POA: Insufficient documentation

## 2015-03-31 DIAGNOSIS — I10 Essential (primary) hypertension: Secondary | ICD-10-CM | POA: Insufficient documentation

## 2015-03-31 DIAGNOSIS — I2510 Atherosclerotic heart disease of native coronary artery without angina pectoris: Secondary | ICD-10-CM | POA: Insufficient documentation

## 2015-03-31 DIAGNOSIS — Z8261 Family history of arthritis: Secondary | ICD-10-CM | POA: Insufficient documentation

## 2015-03-31 DIAGNOSIS — K219 Gastro-esophageal reflux disease without esophagitis: Secondary | ICD-10-CM | POA: Insufficient documentation

## 2015-03-31 DIAGNOSIS — X58XXXA Exposure to other specified factors, initial encounter: Secondary | ICD-10-CM | POA: Insufficient documentation

## 2015-03-31 DIAGNOSIS — F419 Anxiety disorder, unspecified: Secondary | ICD-10-CM | POA: Insufficient documentation

## 2015-03-31 DIAGNOSIS — Z79899 Other long term (current) drug therapy: Secondary | ICD-10-CM | POA: Insufficient documentation

## 2015-03-31 DIAGNOSIS — Z86718 Personal history of other venous thrombosis and embolism: Secondary | ICD-10-CM | POA: Insufficient documentation

## 2015-03-31 DIAGNOSIS — M1990 Unspecified osteoarthritis, unspecified site: Secondary | ICD-10-CM | POA: Insufficient documentation

## 2015-03-31 DIAGNOSIS — Y939 Activity, unspecified: Secondary | ICD-10-CM | POA: Insufficient documentation

## 2015-03-31 DIAGNOSIS — K7460 Unspecified cirrhosis of liver: Secondary | ICD-10-CM | POA: Insufficient documentation

## 2015-03-31 DIAGNOSIS — E785 Hyperlipidemia, unspecified: Secondary | ICD-10-CM | POA: Insufficient documentation

## 2015-03-31 HISTORY — PX: KNEE ARTHROSCOPY: SHX127

## 2015-03-31 SURGERY — ARTHROSCOPY, KNEE
Anesthesia: General | Laterality: Right

## 2015-03-31 MED ORDER — FENTANYL CITRATE (PF) 100 MCG/2ML IJ SOLN
INTRAMUSCULAR | Status: DC | PRN
  Administered 2015-03-31: 100 ug via INTRAVENOUS

## 2015-03-31 MED ORDER — DEXAMETHASONE SODIUM PHOSPHATE 10 MG/ML IJ SOLN
INTRAMUSCULAR | Status: DC | PRN
  Administered 2015-03-31: 5 mg via INTRAVENOUS

## 2015-03-31 MED ORDER — NORCO 5-325 MG PO TABS: 1.0000 | ORAL_TABLET | Freq: Four times a day (QID) | ORAL | Status: DC | PRN

## 2015-03-31 MED ORDER — EPHEDRINE SULFATE 50 MG/ML IJ SOLN
INTRAMUSCULAR | Status: DC | PRN
  Administered 2015-03-31: 10 mg via INTRAVENOUS

## 2015-03-31 MED ORDER — MIDAZOLAM HCL 5 MG/5ML IJ SOLN
INTRAMUSCULAR | Status: DC | PRN
  Administered 2015-03-31: 2 mg via INTRAVENOUS

## 2015-03-31 MED ORDER — LACTATED RINGERS IV SOLN
INTRAVENOUS | Status: DC
  Administered 2015-03-31: 09:00:00 via INTRAVENOUS

## 2015-03-31 MED ORDER — BUPIVACAINE HCL (PF) 0.5 % IJ SOLN
INTRAMUSCULAR | Status: AC
  Filled 2015-03-31: qty 30

## 2015-03-31 MED ORDER — ONDANSETRON HCL 4 MG/2ML IJ SOLN
INTRAMUSCULAR | Status: DC | PRN
  Administered 2015-03-31: 4 mg via INTRAVENOUS

## 2015-03-31 MED ORDER — FENTANYL CITRATE (PF) 100 MCG/2ML IJ SOLN
INTRAMUSCULAR | Status: DC
Start: 2015-03-31 — End: 2015-03-31
  Filled 2015-03-31: qty 2

## 2015-03-31 MED ORDER — PROPOFOL 10 MG/ML IV BOLUS
INTRAVENOUS | Status: DC | PRN
  Administered 2015-03-31: 180 mg via INTRAVENOUS

## 2015-03-31 MED ORDER — BUPIVACAINE HCL (PF) 0.5 % IJ SOLN
INTRAMUSCULAR | Status: DC | PRN
  Administered 2015-03-31: 17 mL

## 2015-03-31 MED ORDER — FENTANYL CITRATE (PF) 100 MCG/2ML IJ SOLN
25.0000 ug | INTRAMUSCULAR | Status: DC | PRN
  Administered 2015-03-31 (×4): 25 ug via INTRAVENOUS

## 2015-03-31 MED ORDER — HYDROCODONE-ACETAMINOPHEN 5-325 MG PO TABS
ORAL_TABLET | ORAL | Status: AC
  Administered 2015-03-31: 1 via ORAL
  Filled 2015-03-31: qty 1

## 2015-03-31 MED ORDER — ONDANSETRON HCL 4 MG/2ML IJ SOLN: 4.0000 mg | Freq: Once | INTRAMUSCULAR | Status: DC | PRN

## 2015-03-31 MED ORDER — HYDROCODONE-ACETAMINOPHEN 5-325 MG PO TABS
1.0000 | ORAL_TABLET | Freq: Four times a day (QID) | ORAL | Status: DC | PRN
  Administered 2015-03-31: 1 via ORAL

## 2015-03-31 SURGICAL SUPPLY — 38 items
ARTHROWAND PARAGON T2 (SURGICAL WAND) ×3
BLADE ABRADER 4.5 (BLADE) IMPLANT
BLADE FULL RADIUS 3.5 (BLADE) ×3 IMPLANT
BLADE SHAVER 4.5X7 STR FR (MISCELLANEOUS) ×3 IMPLANT
BNDG ESMARK 6X12 TAN STRL LF (GAUZE/BANDAGES/DRESSINGS) ×3 IMPLANT
BUR ABRADER 4.0 W/FLUTE AQUA (MISCELLANEOUS) ×1 IMPLANT
BURR ABRADER 4.0 W/FLUTE AQUA (MISCELLANEOUS) ×3
CHLORAPREP W/TINT 26ML (MISCELLANEOUS) ×3 IMPLANT
DRAPE LEGGINS SURG 28X43 STRL (DRAPES) ×3 IMPLANT
GAUZE SPONGE 4X4 12PLY STRL (GAUZE/BANDAGES/DRESSINGS) ×3 IMPLANT
GLOVE BIO SURGEON STRL SZ7.5 (GLOVE) ×3 IMPLANT
GLOVE BIO SURGEON STRL SZ8 (GLOVE) ×3 IMPLANT
GLOVE INDICATOR 8.0 STRL GRN (GLOVE) ×3 IMPLANT
GOWN STRL REUS W/ TWL LRG LVL3 (GOWN DISPOSABLE) ×1 IMPLANT
GOWN STRL REUS W/TWL LRG LVL3 (GOWN DISPOSABLE) ×2
GOWN STRL REUS W/TWL LRG LVL4 (GOWN DISPOSABLE) ×3 IMPLANT
IV LACTATED RINGER IRRG 3000ML (IV SOLUTION) ×8
IV LR IRRIG 3000ML ARTHROMATIC (IV SOLUTION) ×4 IMPLANT
KIT RM TURNOVER STRD PROC AR (KITS) ×3 IMPLANT
MANIFOLD 4PT FOR NEPTUNE1 (MISCELLANEOUS) ×3 IMPLANT
PACK ARTHROSCOPY KNEE (MISCELLANEOUS) ×3 IMPLANT
PADDING CAST 4IN STRL (MISCELLANEOUS)
PADDING CAST BLEND 4X4 STRL (MISCELLANEOUS) IMPLANT
SET TUBE SUCT SHAVER OUTFL 24K (TUBING) ×3 IMPLANT
SET TUBE TIP INTRA-ARTICULAR (MISCELLANEOUS) ×3 IMPLANT
SOL PREP PVP 2OZ (MISCELLANEOUS) ×3
SOLUTION PREP PVP 2OZ (MISCELLANEOUS) ×1 IMPLANT
SUT ETH BLK MONO 3 0 FS 1 12/B (SUTURE) ×3 IMPLANT
SUT ETHILON 3-0 FS-10 30 BLK (SUTURE) ×3
SUTURE EHLN 3-0 FS-10 30 BLK (SUTURE) ×1 IMPLANT
TAPE MICROFOAM 4IN (TAPE) ×3 IMPLANT
TUBING ARTHRO INFLOW-ONLY STRL (TUBING) ×3 IMPLANT
WAND 30 DEG SABER W/CORD (SURGICAL WAND) ×3 IMPLANT
WAND ARTHRO PARAGON T2 (SURGICAL WAND) ×1 IMPLANT
WAND COVAC 50 IFS (MISCELLANEOUS) IMPLANT
WAND HAND CNTRL MULTIVAC 50 (MISCELLANEOUS) IMPLANT
WAND HAND CNTRL MULTIVAC 90 (MISCELLANEOUS) ×3 IMPLANT
WRAP KNEE W/COLD PACKS 25.5X14 (SOFTGOODS) ×3 IMPLANT

## 2015-03-31 NOTE — Transfer of Care (Signed)
Immediate Anesthesia Transfer of Care Note  Patient: Shaun Weaver  Procedure(s) Performed: Procedure(s): ARTHROSCOPY KNEE (Right)  Patient Location: PACU  Anesthesia Type:General  Level of Consciousness: awake, alert  and oriented  Airway & Oxygen Therapy: Patient Spontanous Breathing and Patient connected to face mask oxygen  Post-op Assessment: Report given to RN and Post -op Vital signs reviewed and stable  Post vital signs: Reviewed and stable  Last Vitals:  Filed Vitals:   03/31/15 0848 03/31/15 1153  BP: 124/81 131/87  Pulse: 58   Temp: 36.7 C 36.6 C  Resp: 18 14    Complications: No apparent anesthesia complications

## 2015-03-31 NOTE — H&P (Signed)
  H and P reviewed. No changes. Uploaded at later date. 

## 2015-03-31 NOTE — Discharge Instructions (Signed)
° °[facility]  °Harold B. Shaun, Jr., M.D. [phone]  ° °KNEE ARTHROSCOPY POST OPERATION INSTRUCTIONS: ° °PLEASE READ THESE INSTRUCTIONS ABOUT POST OPERATION CARE. THEY WILL ANSWER MOST OF YOUR QUESTIONS.  °You have been given a prescription for pain. Please take as directed for pain.  °You can walk, keeping the knee slightly stiff-avoid doing too much bending the first day. (if ACL reconstruction is performed, keep brace locked in extension when walking.)  °You will use crutches or cane if needed. Can weight bear as tolerated  °Plan to take three to four days off from work. You can resume work when you are comfortable. (This can be a week or more, depending on the type of work you do.)  °To reduce pain and swelling, place one to two pillows under the knee the first two or three days when sitting or lying. An ice pack may be placed on top of the area over the dressing. Instructions for making homemade icepack are as follow:  °Flexible homemade alcohol water ice pack  °2 cups water  °1 cup rubbing alcohol  °food coloring for the blue tint (optional)  °2 zip-top bags - gallon-size  °Mix the water and alcohol together in one of your zip-top bags and add food coloring. Release as much air as possible and seal the bag. Place in freezer for at least 12 hours.  °The small incisions in your knee are closed with nylon stitches. They will be removed in the office.  °The bulky dressing may be removed in the third day after surgery. (If ACL surgery-DO NOT REMOVE BANDAGES). Put a waterproof band-aid over each stitch. Do not put any creams or ointments on wounds. You may shower at this time, but change waterproof band-aids after showering. KEEP INCISIONS CLEAN AND DRY UNTIL YOU RETURN TO THE OFFICE.  °Sometimes the operative area remains somewhat painful and swollen for several weeks. This is usually nothing to worry about, but call if you have any excessive symptoms, especially  fever. It is not unusual to have a low grade fever of 99 degrees for the first few days. If persist after 3-4 days call the office. It is not uncommon for the pain to be a little worse on the third day after surgery.  °Begin doing gentle exercises right away. They will be limited by the amount of pain and swelling you have.  Exercising will reduce the swelling, increase motion, and prevent muscle weakness. Exercises: Straight leg raising and gentle knee bending.  °Take 81 milligram aspirin twice a day for 2 weeks after meals or milk. This along with elevation will help reduce the possibility of phlebitis in your operated leg.  °Avoid strenuous athletics for a minimum of 4 to 6 weeks after arthroscopic surgery (approximately five months if ACL surgery).  °If the surgery included ACL reconstruction the brace that is supplied to the extremity post surgery is to be locked in extension when you are asleep and is to be locked in extension when you are ambulating. It can be unlocked for exercises or sitting.  °Keep your post surgery appointment that has been made for you. If you do not remember the date call [phone]. Your follow up appointment should be between 7-10 days.  ° ° ° °AMBULATORY SURGERY  °DISCHARGE INSTRUCTIONS ° ° °1) The drugs that you were given will stay in your system until tomorrow so for the next 24 hours you should not: ° °A) Drive an automobile °B) Make any   legal decisions °C) Drink any alcoholic beverage ° ° °2) You may resume regular meals tomorrow.  Today it is better to start with liquids and gradually work up to solid foods. ° °You may eat anything you prefer, but it is better to start with liquids, then soup and crackers, and gradually work up to solid foods. ° ° °3) Please notify your doctor immediately if you have any unusual bleeding, trouble breathing, redness and pain at the surgery site, drainage, fever, or pain not relieved by medication. ° ° ° °4) Additional  Instructions: ° ° ° ° ° ° ° °Please contact your physician with any problems or Same Day Surgery at [phone], Monday through Friday 6 am to 4 pm, or [place] [place] Main number at [phone]. °

## 2015-03-31 NOTE — Op Note (Signed)
Patient: Shaun Weaver, Shaun Weaver.  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   History: Patient's had a long history of right knee pain.  The plain films revealed no significant joint space narrowing .  The patient had an MRI which revealed a torn medial meniscus with early medial compartment chondral thinning.The patient was scheduled for surgery due to persistent discomfort despite conservative treatment.  The patient was taken the operating room where satisfactory general anesthesia was achieved. A tourniquet and leg holder were was applied to the right thigh. A well leg support was applied to the nonoperative extremity. The right knee was prepped and draped in usual fashion for an arthroscopic procedure. An inflow cannula was introduced superomedially. The joint was distended with lactated Ringer's. Scope was introduced through an inferolateral puncture wound and a probe through an inferomedial puncture wound. Inspection of the medial compartment revealed  what appeared to be remote horizontal tear of the posterior horn of the medial meniscus with an inferior leaf that had ruptured and attached itself to the anterior horn of the medial meniscus. I went ahead and resected the torn fragment using a combination of a motorized resector and an angled ArthroCare wand. I did notice that the patient had some grade 2 chondral changes in the medial tibial plateau. These were more medially located. Inspection of the intercondylar notch revealed intact cruciates. Inspection of the the lateral compartment revealed no meniscal or chondral pathology.   Trochlear groove was  inspected and revealed grade 2 chondral changes. I went ahead and coblated the trochlear groove lesion with an ArthroCare [place] wand.  Retropatellar surface was smooth. The patella seemed to track fairly well.  The instruments were removed from the joint at this time. The puncture wounds were closed with 3-0 nylon in vertical mattress fashion. I injected each puncture wound with several cc of half percent Marcaine without epinephrine. Betadine was applied the wounds followed by sterile dressing. An ice pack was applied to the right knee. The patient was awakened and transferred to the stretcher bed. The patient was taken to the recovery room in satisfactory condition.  The tourniquet was not inflated during the course of the procedure. Blood loss was negligible.

## 2015-03-31 NOTE — Anesthesia Preprocedure Evaluation (Addendum)
Anesthesia Evaluation  Patient identified by MRN, date of birth, ID band Patient awake    Reviewed: Allergy & Precautions, H&P , NPO status , Patient's Chart, lab work & pertinent test results, reviewed documented beta blocker date and time   History of Anesthesia Complications Negative for: history of anesthetic complications  Airway Mallampati: I  TM Distance: >3 FB Neck ROM: full    Dental no notable dental hx. (+) Missing, Teeth Intact   Pulmonary neg pulmonary ROS,    Pulmonary exam normal breath sounds clear to auscultation       Cardiovascular Exercise Tolerance: Good hypertension, Pt. on medications and Pt. on home beta blockers (-) angina+ CAD  (-) Past MI, (-) Cardiac Stents and (-) CABG Normal cardiovascular exam(-) dysrhythmias (-) Valvular Problems/Murmurs Rhythm:regular Rate:Normal     Neuro/Psych Anxiety negative neurological ROS  negative psych ROS   GI/Hepatic GERD  Medicated and Controlled,(+) Cirrhosis       , Hx of DVT of portal vein   Endo/Other  neg diabetesHypothyroidism   Renal/GU negative Renal ROS  negative genitourinary   Musculoskeletal  (+) Arthritis , Osteoarthritis,    Abdominal Normal abdominal exam  (+)   Peds  Hematology negative hematology ROS (+)   Anesthesia Other Findings Past Medical History:   Anxiety                                                      H/O alcohol abuse                                            Arthritis                                                    Cirrhosis, alcoholic (HCC)                                   Coronary artery disease                                      GERD (gastroesophageal reflux disease)                       Hypertension                                                 Thyroid disease                                              Deep vein thrombosis of portal vein             2014  Comment:[facility]   Reproductive/Obstetrics                            Anesthesia Physical  Anesthesia Plan  ASA: III  Anesthesia Plan: General   Post-op Pain Management:    Induction: Intravenous  Airway Management Planned: LMA  Additional Equipment:   Intra-op Plan:   Post-operative Plan: Extubation in OR  Informed Consent: I have reviewed the patients History and Physical, chart, labs and discussed the procedure including the risks, benefits and alternatives for the proposed anesthesia with the patient or authorized representative who has indicated his/her understanding and acceptance.   Dental Advisory Given and Dental advisory given  Plan Discussed with: Anesthesiologist, CRNA and Surgeon  Anesthesia Plan Comments:        Anesthesia Quick Evaluation

## 2015-04-02 NOTE — Anesthesia Postprocedure Evaluation (Signed)
Anesthesia Post Note  Patient: Shaun Weaver  Procedure(s) Performed: Procedure(s) (LRB): ARTHROSCOPY KNEE (Right)  Patient location during evaluation: PACU Anesthesia Type: General Level of consciousness: awake and alert and oriented Pain management: pain level controlled Vital Signs Assessment: post-procedure vital signs reviewed and stable Respiratory status: spontaneous breathing Cardiovascular status: blood pressure returned to baseline Anesthetic complications: no    Last Vitals:  Filed Vitals:   03/31/15 1239 03/31/15 1330  BP: 146/92 136/68  Pulse: 58 67  Temp: 36.5 C   Resp: 18 16    Last Pain:  Filed Vitals:   04/01/15 0835  PainSc: 0-No pain                 Jaxie Racanelli

## 2016-07-24 DIAGNOSIS — H33021 Retinal detachment with multiple breaks, right eye: Secondary | ICD-10-CM | POA: Insufficient documentation

## 2016-09-28 ENCOUNTER — Ambulatory Visit (INDEPENDENT_AMBULATORY_CARE_PROVIDER_SITE_OTHER): Payer: Self-pay

## 2016-09-28 ENCOUNTER — Ambulatory Visit
Admission: EM | Admit: 2016-09-28 | Discharge: 2016-09-28 | Disposition: A | Discharge status: Home / Self Care | Payer: Self-pay | Attending: Family Medicine | Admitting: Family Medicine

## 2016-09-28 DIAGNOSIS — S60031A Contusion of right middle finger without damage to nail, initial encounter: Secondary | ICD-10-CM

## 2016-09-28 DIAGNOSIS — W268XXA Contact with other sharp object(s), not elsewhere classified, initial encounter: Secondary | ICD-10-CM

## 2016-09-28 DIAGNOSIS — S61212A Laceration without foreign body of right middle finger without damage to nail, initial encounter: Secondary | ICD-10-CM

## 2016-09-28 DIAGNOSIS — Z23 Encounter for immunization: Secondary | ICD-10-CM

## 2016-09-28 MED ORDER — CEPHALEXIN 500 MG PO CAPS: 500.0000 mg | ORAL_CAPSULE | Freq: Four times a day (QID) | ORAL | 0 refills | Status: AC

## 2016-09-28 MED ORDER — MUPIROCIN 2 % EX OINT: TOPICAL_OINTMENT | CUTANEOUS | 0 refills | Status: DC

## 2016-09-28 MED ORDER — TETANUS-DIPHTH-ACELL PERTUSSIS 5-2.5-18.5 LF-MCG/0.5 IM SUSP
0.5000 mL | Freq: Once | INTRAMUSCULAR | Status: AC
  Administered 2016-09-28: 0.5 mL via INTRAMUSCULAR

## 2016-09-28 MED ORDER — CEPHALEXIN 500 MG PO CAPS: 500.0000 mg | ORAL_CAPSULE | Freq: Four times a day (QID) | ORAL | 0 refills | Status: DC

## 2016-09-28 NOTE — ED Provider Notes (Signed)
[facility] ____________________________________________  Time seen: Approximately 9:54 PM  I have reviewed the triage vital signs and the nursing notes.   HISTORY  Chief Complaint Laceration  HPI Shaun Weaver is a 56 y.o. male  presenting for evaluation of right middle finger pain at laceration that occurred at approximately 3 PM today. Patient reports that he was working outside on a hay baler, and states when he went to drop the new cover his finger got caught and was cut by the metal flap. Patient states that he had to complete his job so he applied a Band-Aid and continued to work. States mild pain directly at laceration site. Denies other pain or injury. Denies decreased range of motion, paresthesias or other complaints. Reports right hand dominant. No over-the-counter medications taken for the same complaint. No other alleviating measures attempted. Reports otherwise feels well.    Denies recent sickness. Denies recent antibiotic use. Reports last tetanus immunization 6 years ago.   Sofie Hartigan, MD: PCP    Past Medical History:  Diagnosis Date  . Anxiety   . Arthritis   . Back pain   . Cirrhosis, alcoholic ([place])   . Coronary artery disease   . Deep vein thrombosis of portal vein 2014   [facility]  . Environmental allergies   . GERD (gastroesophageal reflux disease)   . H/O alcohol abuse   . Hypertension   . Hypothyroidism   . Thyroid disease     There are no active problems to display for this patient.   Past Surgical History:  Procedure Laterality Date  . ANKLE FRACTURE SURGERY Right   . EYE SURGERY Right    Cataract Extraction with lens implant  . FRACTURE SURGERY Bilateral    pinning right ankle  . KNEE ARTHROSCOPY Left 02/26/2015   Procedure: ARTHROSCOPY Left  KNEE partial medial menisectomy;  Surgeon: Leanor Kail, MD;  Location: [facility] ORS;  Service: Orthopedics;  Laterality: Left;  . KNEE ARTHROSCOPY Right 03/31/2015   Procedure: ARTHROSCOPY KNEE;  Surgeon: Leanor Kail, MD;  Location: [facility] ORS;  Service: Orthopedics;  Laterality: Right;     No current facility-administered medications for this encounter.   Current Outpatient Prescriptions:  .  ergocalciferol (VITAMIN D2) 50000 units capsule, Take 50,000 Units by mouth once a week. Friday, Disp: , Rfl:  .  fluticasone (FLONASE) 50 MCG/ACT nasal spray, Place 2 sprays into both nostrils daily., Disp: , Rfl:  .  gabapentin (NEURONTIN) 300 MG capsule, Take 600 mg by mouth 3 (three) times daily., Disp: , Rfl:  .  levothyroxine (SYNTHROID, LEVOTHROID) 100 MCG tablet, Take 100 mcg by mouth daily before breakfast., Disp: , Rfl:  .  magnesium 30 MG tablet, Take 30 mg by mouth 2 (two) times daily., Disp: , Rfl:  .  nadolol (CORGARD) 40 MG tablet, Take 40 mg by mouth at bedtime., Disp: , Rfl:  .  omeprazole (PRILOSEC) 20 MG capsule, Take 20 mg by mouth at bedtime., Disp: , Rfl:  .  cephALEXin (KEFLEX) 500 MG capsule, Take 1 capsule (500 mg total) by mouth 4 (four) times daily., Disp: 28 capsule, Rfl: 0 .  furosemide (LASIX) 40 MG tablet, Take 40 mg by mouth daily., Disp: , Rfl:  .  mupirocin ointment (BACTROBAN) 2 %, Apply two times a day for 7 days., Disp: 22 g, Rfl: 0 .  NORCO 5-325 MG tablet, Take 1-2 tablets by mouth every 6 (six) hours as needed for moderate pain. MAXIMUM TOTAL ACETAMINOPHEN DOSE IS  4000 MG PER DAY, Disp: 25 tablet, Rfl: 0 .  spironolactone (ALDACTONE) 25 MG tablet, Take 25 mg by mouth daily., Disp: , Rfl:  .  traZODone (DESYREL) 50 MG tablet, Take 50 mg by mouth as needed for sleep., Disp: , Rfl:   Allergies Patient has no known allergies.  History reviewed. No pertinent family history.  Social History Social History  Substance Use Topics  . Smoking status: Never Smoker  . Smokeless tobacco: Never Used  . Alcohol use No    Review of Systems Constitutional: No fever/chills Cardiovascular: Denies chest pain. Respiratory: Denies  shortness of breath. Gastrointestinal: No abdominal pain.   Musculoskeletal: Negative for back pain. Skin: As above.    ____________________________________________   PHYSICAL EXAM:  VITAL SIGNS: ED Triage Vitals  Enc Vitals Group     BP 09/28/16 2105 136/79     Pulse Rate 09/28/16 2105 73     Resp 09/28/16 2105 18     Temp 09/28/16 2105 97.9 F (36.6 C)     Temp Source 09/28/16 2105 Oral     SpO2 09/28/16 2105 99 %     Weight 09/28/16 2102 230 lb (104.3 kg)     Height 09/28/16 2102 6' (1.829 m)     Head Circumference --      Peak Flow --      Pain Score 09/28/16 2102 5     Pain Loc --      Pain Edu? --      Excl. in [place]? --     Constitutional: Alert and oriented. Well appearing and in no acute distress. Cardiovascular: Normal rate, regular rhythm. Grossly normal heart sounds.  Good peripheral circulation. Respiratory: Normal respiratory effort without tachypnea nor retractions. Breath sounds are clear and equal bilaterally. No wheezes, rales, rhonchi. Musculoskeletal: Steady gait. See skin below. Bilateral distal radial pulses equal and easily palpated.  Neurologic:  Normal speech and language. Speech is normal. No gait instability.  Skin:  Skin is warm, dry. Except: right third digit palmer aspect proximal phalanx Approximate 4 cm laceration flap, mild active bleeding, mild dirt debris along wound edges, no dirt of foreign body noted in wound, tendon visualized, tendon appears intact, good resisted flexion and extension with full range of motion present to third digit of right hand, normal distal sensation capillary refill, nail intact, mild tenderness to palpation directly at laceration site, no point bony tenderness, right hand otherwise nontender. Psychiatric: Mood and affect are normal. Speech and behavior are normal. Patient exhibits appropriate insight and judgment   ___________________________________________   LABS (all labs ordered are listed, but only abnormal  results are displayed)  Labs Reviewed - No data to display  RADIOLOGY  Dg Finger Middle Right  Result Date: 09/28/2016 CLINICAL DATA:  Middle finger laceration today. EXAM: RIGHT MIDDLE FINGER 2+V COMPARISON:  None. FINDINGS: There is no evidence of fracture or dislocation. There is no evidence of arthropathy or other focal bone abnormality. Soft tissues are unremarkable. IMPRESSION: Negative. Electronically Signed   By: Abelardo Diesel M.D.   On: 09/28/2016 21:51   ____________________________________________   PROCEDURES Procedures   Procedure(s) performed:  Procedure explained and verbal consent obtained. Consent: Verbal consent obtained. Written consent not obtained. Risks and benefits: risks, benefits and alternatives were discussed Patient identity confirmed: verbally with patient and hospital-assigned identification number  Consent given by: patient   Laceration Repair Location: right middle finger  Length:4cm  Foreign bodies: mild dirt debris along wound edges, no dirt of foreign body  noted in wound Tendon involvement: none Nerve involvement: none Preparation: Patient was prepped and draped in the usual sterile fashion. Anesthesia with 1% Lidocaine 6 mls Irrigation solution: sterile water and betadine Irrigation method: jet lavage Amount of cleaning: copious Repaired with 4-0 nylon  Number of sutures: 8 Technique: simple interrupted  Approximation: loose Patient tolerate well. Wound well approximated post repair.  Antibiotic ointment and dressing applied.  Wound care instructions provided.  Observe for any signs of infection or other problems.   Finger splint applied to third digit and third and fourth fingers buddy taped.   INITIAL IMPRESSION / ASSESSMENT AND PLAN / ED COURSE  Pertinent labs & imaging results that were available during my care of the patient were reviewed by me and considered in my medical decision making (see chart for details).  Well-appearing  patient. No acute distress. Right third digit injury and laceration second to mechanical injury earlier today. This did occur at work. Patient states that his employer requested to receive bill later from patient. States no document of restrictions needed. Wound was copiously cleaned and irrigated. Reviewed x-ray, no acute bony abnormalities noted, no radiopaque foreign bodies. Wound repaired as above. Patient tolerated well. Will prophylactically place patient on oral Keflex and topical Bactroban. Discussed wound care, strict follow-up and return parameters. Return in 7-10 days for suture removal. Discussed indication, risks and benefits of medications with patient.  Discussed follow up with Primary care physician this week. Discussed follow up and return parameters including no resolution or any worsening concerns. Patient verbalized understanding and agreed to plan.   ____________________________________________   FINAL CLINICAL IMPRESSION(S) / ED DIAGNOSES  Final diagnoses:  Laceration of right middle finger without foreign body without damage to nail, initial encounter  Contusion of right middle finger without damage to nail, initial encounter     Discharge Medication List as of 09/28/2016 10:39 PM    START taking these medications   Details  cephALEXin (KEFLEX) 500 MG capsule Take 1 capsule (500 mg total) by mouth 4 (four) times daily., Starting Tue 09/28/2016, Until Tue 10/05/2016, Normal    mupirocin ointment (BACTROBAN) 2 % Apply two times a day for 7 days., Normal        Note: This dictation was prepared with Dragon dictation along with smaller phrase technology. Any transcriptional errors that result from this process are unintentional.         Marylene Land, NP 09/28/16 2258

## 2016-09-28 NOTE — ED Triage Notes (Signed)
Patient complains of right hand middle finger laceration that occurred approx around 300pm today on a hay baylor. Patient states that area was bleeding but has since stopped. Patient states that pain is pulsating in finger.

## 2016-09-28 NOTE — Discharge Instructions (Signed)
Take medication as prescribed. Keep clean and dry. Use finger splint as discussed.   Return in 7-10 days for suture removal.   Follow up with your primary care physician this week as needed. Return sooner to Urgent care for redness, swelling, drainage, pain, new or worsening concerns.

## 2016-10-06 ENCOUNTER — Ambulatory Visit
Admission: EM | Admit: 2016-10-06 | Discharge: 2016-10-06 | Disposition: A | Discharge status: Home / Self Care | Payer: Self-pay | Attending: Family Medicine | Admitting: Family Medicine

## 2016-10-06 ENCOUNTER — Encounter: Payer: Self-pay | Admitting: *Deleted

## 2016-10-06 DIAGNOSIS — Z4802 Encounter for removal of sutures: Secondary | ICD-10-CM

## 2016-10-06 DIAGNOSIS — Z5189 Encounter for other specified aftercare: Secondary | ICD-10-CM

## 2016-10-06 NOTE — ED Triage Notes (Signed)
Patient arrived for suture removal

## 2016-10-06 NOTE — ED Provider Notes (Signed)
[facility] ____________________________________________  Time seen: Approximately 5:15 PM  I have reviewed the triage vital signs and the nursing notes.   HISTORY  Chief Complaint  Suture / Staple Removal  HPI  Shaun Weaver is a 56 y.o. male presenting for suture removal. Patient seen in urgent care 7 days ago for right middle finger injury with laceration that was repaired. Reports has been taking an antibiotic orally as well as topical as prescribed and tolerating well. 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 States did wear finger splint for a day or 2, but otherwise has not been wearing finger splint and frequent hand movements. Denies any drainage, redness, decreased sensation, decreased movement or fevers. Reports otherwise feels well. Denies pain at this time. Tetanus immunization updated at last visit.   Sofie Hartigan, MD: PCP    Past Medical History:  Diagnosis Date  . Anxiety   . Arthritis   . Back pain   . Cirrhosis, alcoholic ([place])   . Coronary artery disease   . Deep vein thrombosis of portal vein 2014   [facility]  . Environmental allergies   . GERD (gastroesophageal reflux disease)   . H/O alcohol abuse   . Hypertension   . Hypothyroidism   . Thyroid disease     There are no active problems to display for this patient.   Past Surgical History:  Procedure Laterality Date  . ANKLE FRACTURE SURGERY Right   . EYE SURGERY Right    Cataract Extraction with lens implant  . FRACTURE SURGERY Bilateral    pinning right ankle  . KNEE ARTHROSCOPY Left 02/26/2015   Procedure: ARTHROSCOPY Left  KNEE partial medial menisectomy;  Surgeon: Leanor Kail, MD;  Location: [facility] ORS;  Service: Orthopedics;  Laterality: Left;  . KNEE ARTHROSCOPY Right 03/31/2015   Procedure: ARTHROSCOPY KNEE;  Surgeon: Leanor Kail, MD;  Location: [facility] ORS;  Service: Orthopedics;  Laterality: Right;     No current  facility-administered medications for this encounter.   Current Outpatient Prescriptions:  .  ergocalciferol (VITAMIN D2) 50000 units capsule, Take 50,000 Units by mouth once a week. Friday, Disp: , Rfl:  .  fluticasone (FLONASE) 50 MCG/ACT nasal spray, Place 2 sprays into both nostrils daily., Disp: , Rfl:  .  furosemide (LASIX) 40 MG tablet, Take 40 mg by mouth daily., Disp: , Rfl:  .  gabapentin (NEURONTIN) 300 MG capsule, Take 600 mg by mouth 3 (three) times daily., Disp: , Rfl:  .  levothyroxine (SYNTHROID, LEVOTHROID) 100 MCG tablet, Take 100 mcg by mouth daily before breakfast., Disp: , Rfl:  .  magnesium 30 MG tablet, Take 30 mg by mouth 2 (two) times daily., Disp: , Rfl:  .  mupirocin ointment (BACTROBAN) 2 %, Apply two times a day for 7 days., Disp: 22 g, Rfl: 0 .  nadolol (CORGARD) 40 MG tablet, Take 40 mg by mouth at bedtime., Disp: , Rfl:  .  NORCO 5-325 MG tablet, Take 1-2 tablets by mouth every 6 (six) hours as needed for moderate pain. MAXIMUM TOTAL ACETAMINOPHEN DOSE IS 4000 MG PER DAY, Disp: 25 tablet, Rfl: 0 .  omeprazole (PRILOSEC) 20 MG capsule, Take 20 mg by mouth at bedtime., Disp: , Rfl:  .  spironolactone (ALDACTONE) 25 MG tablet, Take 25 mg by mouth daily., Disp: , Rfl:  .  traZODone (DESYREL) 50 MG tablet, Take 50 mg by mouth as needed for sleep., Disp: , Rfl:  Allergies Patient has no known allergies.  History reviewed. No pertinent family history.  Social History Social History  Substance Use Topics  . Smoking status: Never Smoker  . Smokeless tobacco: Never Used  . Alcohol use No    Review of Systems Constitutional: No fever/chills Skin: Negative for rash. As above.  ____________________________________________   PHYSICAL EXAM:  VITAL SIGNS: ED Triage Vitals [10/06/16 1713]  Enc Vitals Group     BP 129/77     Pulse Rate 66     Resp 16     Temp 97.7 F (36.5 C)     Temp Source Oral     SpO2 100 %     Weight      Height      Head  Circumference      Peak Flow      Pain Score 0     Pain Loc      Pain Edu?      Excl. in [place]?     Constitutional: Alert and oriented. Well appearing and in no acute distress. Neurologic:  Normal speech and language. Speech is normal. No gait instability.  Skin:  Skin is warm, dry. Except: Right third digit palmar aspect proximal phalanx well approximated wound with 8 sutures present, no erythema, nontender, no drainage, digit with full range of motion present with good resisted flexion and extension, and normal distal sensation. Psychiatric: Mood and affect are normal. Speech and behavior are normal. Patient exhibits appropriate insight and judgment   ___________________________________________   LABS (all labs ordered are listed, but only abnormal results are displayed)  Labs Reviewed - No data to display  RADIOLOGY  No results found. ____________________________________________   PROCEDURES Procedures    INITIAL IMPRESSION / ASSESSMENT AND PLAN / ED COURSE  Pertinent labs & imaging results that were available during my care of the patient were reviewed by me and considered in my medical decision making (see chart for details).  Right third digit wound still healing and well approximated, sutures examined, concern for dehiscence with current suture removal recommend sutures to remain present for a few more days. Patient states he will return on Sunday for suture removal as that is a day off for him. Continue antibiotics as prescribed, continue keeping clean. Another finger splint given.   Discussed follow up and return parameters including no resolution or any worsening concerns. Patient verbalized understanding and agreed to plan.   ____________________________________________   FINAL CLINICAL IMPRESSION(S) / ED DIAGNOSES  Final diagnoses:  Visit for suture removal  Visit for wound check     Discharge Medication List as of 10/06/2016  5:27 PM      Note: This  dictation was prepared with Dragon dictation along with smaller phrase technology. Any transcriptional errors that result from this process are unintentional.         Marylene Land, NP 10/06/16 1954

## 2016-10-10 ENCOUNTER — Ambulatory Visit
Admission: EM | Admit: 2016-10-10 | Discharge: 2016-10-10 | Disposition: A | Discharge status: Home / Self Care | Payer: Self-pay

## 2016-10-10 NOTE — ED Triage Notes (Signed)
Suture removal

## 2018-02-23 ENCOUNTER — Ambulatory Visit
Admission: EM | Admit: 2018-02-23 | Discharge: 2018-02-23 | Disposition: A | Discharge status: Home / Self Care | Payer: BLUE CROSS/BLUE SHIELD | Attending: Family Medicine | Admitting: Family Medicine

## 2018-02-23 ENCOUNTER — Other Ambulatory Visit: Payer: Self-pay

## 2018-02-23 ENCOUNTER — Encounter: Payer: Self-pay | Admitting: Emergency Medicine

## 2018-02-23 DIAGNOSIS — L03113 Cellulitis of right upper limb: Secondary | ICD-10-CM | POA: Diagnosis not present

## 2018-02-23 MED ORDER — TRIAMCINOLONE ACETONIDE 0.1 % EX OINT: 1.0000 "application " | TOPICAL_OINTMENT | Freq: Two times a day (BID) | CUTANEOUS | 0 refills | Status: DC

## 2018-02-23 MED ORDER — DOXYCYCLINE HYCLATE 100 MG PO CAPS: 100.0000 mg | ORAL_CAPSULE | Freq: Two times a day (BID) | ORAL | 0 refills | Status: DC

## 2018-02-23 NOTE — ED Triage Notes (Signed)
Patient c/o rash to his right hand, left hand and arm x 2 weeks. He states they were water blisters and he popped them and he now has red streaking on his right arm. Patient thinks its related to insulation that he is working on.

## 2018-02-23 NOTE — ED Provider Notes (Signed)
[facility]    CSN: [id number] Arrival date & time: 02/23/18  1412  History   Chief Complaint Chief Complaint  Patient presents with  . Rash   HPI  58 year old male presents with rash.  Patient reports that approximately 2 weeks ago he came into contact with some ablation.  He states that it was old.  He states that he subsequently developed blisters on his forearm and wrist (right).  He states that he subsequently popped these blisters and they later developed purulent drainage.  Today he noticed red streaking going up his right arm.  Denies fever.  Denies current drainage from his wounds recent wounds.  Patient concerned about infection.  No other associated symptoms.  No other complaints.  PMH, Surgical Hx, Family Hx, Social History reviewed and updated as below.  Past Medical History:  Diagnosis Date  . Anxiety   . Arthritis   . Back pain   . Cirrhosis, alcoholic ([place])   . Coronary artery disease   . Deep vein thrombosis of portal vein 2014   [facility]  . Environmental allergies   . GERD (gastroesophageal reflux disease)   . H/O alcohol abuse   . Hypertension   . Hypothyroidism   . Thyroid disease    Past Surgical History:  Procedure Laterality Date  . ANKLE FRACTURE SURGERY Right   . EYE SURGERY Right    Cataract Extraction with lens implant  . FRACTURE SURGERY Bilateral    pinning right ankle  . KNEE ARTHROSCOPY Left 02/26/2015   Procedure: ARTHROSCOPY Left  KNEE partial medial menisectomy;  Surgeon: Leanor Kail, MD;  Location: [facility] ORS;  Service: Orthopedics;  Laterality: Left;  . KNEE ARTHROSCOPY Right 03/31/2015   Procedure: ARTHROSCOPY KNEE;  Surgeon: Leanor Kail, MD;  Location: [facility] ORS;  Service: Orthopedics;  Laterality: Right;   Home Medications    Prior to Admission medications   Medication Sig Start Date End Date Taking? Authorizing Provider  ergocalciferol (VITAMIN D2) 50000 units capsule Take 50,000 Units by mouth once a  week. Friday   Yes [provider]  fluticasone (FLONASE) 50 MCG/ACT nasal spray Place 2 sprays into both nostrils daily.   Yes [provider]  furosemide (LASIX) 40 MG tablet Take 40 mg by mouth daily.   Yes [provider]  gabapentin (NEURONTIN) 300 MG capsule Take 600 mg by mouth 3 (three) times daily.   Yes [provider]  levothyroxine (SYNTHROID, LEVOTHROID) 100 MCG tablet Take 100 mcg by mouth daily before breakfast.   Yes [provider]  magnesium 30 MG tablet Take 30 mg by mouth 2 (two) times daily.   Yes [provider]  mupirocin ointment (BACTROBAN) 2 % Apply two times a day for 7 days. 09/28/16  Yes Marylene Land, NP  nadolol (CORGARD) 40 MG tablet Take 40 mg by mouth at bedtime.   Yes [provider]  NORCO 5-325 MG tablet Take 1-2 tablets by mouth every 6 (six) hours as needed for moderate pain. MAXIMUM TOTAL ACETAMINOPHEN DOSE IS 4000 MG PER DAY 03/31/15  Yes Leanor Kail, MD  omeprazole (PRILOSEC) 20 MG capsule Take 20 mg by mouth at bedtime.   Yes [provider]  spironolactone (ALDACTONE) 25 MG tablet Take 25 mg by mouth daily.   Yes [provider]  traZODone (DESYREL) 50 MG tablet Take 50 mg by mouth as needed for sleep.   Yes [provider]  doxycycline (VIBRAMYCIN) 100 MG capsule Take 1 capsule (100  mg total) by mouth 2 (two) times daily. 02/23/18   Coral Spikes, DO  triamcinolone ointment (KENALOG) 0.1 % Apply 1 application topically 2 (two) times daily. 02/23/18   Coral Spikes, DO   Social History Social History   Tobacco Use  . Smoking status: Never Smoker  . Smokeless tobacco: Never Used  Substance Use Topics  . Alcohol use: No  . Drug use: No    Allergies   Patient has no known allergies.  Review of Systems Review of Systems  Constitutional: Negative for fever.  Skin: Positive for rash.   Physical Exam Triage Vital Signs ED Triage Vitals  Enc Vitals  Group     BP 02/23/18 1509 137/87     Pulse Rate 02/23/18 1509 78     Resp 02/23/18 1509 18     Temp 02/23/18 1509 97.9 F (36.6 C)     Temp Source 02/23/18 1509 Oral     SpO2 02/23/18 1509 98 %     Weight 02/23/18 1508 235 lb (106.6 kg)     Height 02/23/18 1508 6' (1.829 m)     Head Circumference --      Peak Flow --      Pain Score 02/23/18 1507 1     Pain Loc --      Pain Edu? --      Excl. in [place]? --    Updated Vital Signs BP 137/87 (BP Location: Right Arm)   Pulse 78   Temp 97.9 F (36.6 C) (Oral)   Resp 18   Ht 6' (1.829 m)   Wt 106.6 kg   SpO2 98%   BMI 31.87 kg/m   Visual Acuity Right Eye Distance:   Left Eye Distance:   Bilateral Distance:    Right Eye Near:   Left Eye Near:    Bilateral Near:     Physical Exam Vitals signs and nursing note reviewed.  Constitutional:      General: He is not in acute distress. HENT:     Head: Normocephalic and atraumatic.     Nose: Nose normal.  Eyes:     General: No scleral icterus.    Conjunctiva/sclera: Conjunctivae normal.  Pulmonary:     Effort: Pulmonary effort is normal. No respiratory distress.  Skin:    Comments: Right wrist and forearm - 3 small raised areas. Streaking noted on the volar aspect on the forearm. Extends to the elbow.  Neurological:     Mental Status: He is alert.  Psychiatric:        Mood and Affect: Mood normal.        Behavior: Behavior normal.    UC Treatments / Results  Labs (all labs ordered are listed, but only abnormal results are displayed) Labs Reviewed - No data to display  EKG None  Radiology No results found.  Procedures Procedures (including critical care time)  Medications Ordered in UC Medications - No data to display  Initial Impression / Assessment and Plan / UC Course  I have reviewed the triage vital signs and the nursing notes.  Pertinent labs & imaging results that were available during my care of the patient were reviewed by me and considered in my  medical decision making (see chart for details).    58 year old male presents with rash that he developed which was likely secondary to contact/irritant dermatitis.  Subsequently developed cellulitis.  Treating with doxycycline.  Triamcinolone as well.   Final Clinical Impressions(s) / UC Diagnoses  Final diagnoses:  Cellulitis of right upper extremity   Discharge Instructions   None    ED Prescriptions    Medication Sig Dispense Auth. Provider   doxycycline (VIBRAMYCIN) 100 MG capsule Take 1 capsule (100 mg total) by mouth 2 (two) times daily. 14 capsule Marcina Kinnison G, DO   triamcinolone ointment (KENALOG) 0.1 % Apply 1 application topically 2 (two) times daily. 30 g Coral Spikes, DO     Controlled Substance Prescriptions [place] Controlled Substance Registry consulted? Not Applicable   Coral Spikes, DO 02/23/18 1733

## 2018-04-11 ENCOUNTER — Encounter: Payer: Self-pay | Admitting: Anesthesiology

## 2018-04-11 ENCOUNTER — Encounter: Payer: Self-pay | Admitting: *Deleted

## 2018-04-11 ENCOUNTER — Other Ambulatory Visit: Payer: Self-pay

## 2018-04-13 NOTE — Discharge Instructions (Signed)
General Anesthesia, Adult, Care After  This sheet gives you information about how to care for yourself after your procedure. Your health care provider may also give you more specific instructions. If you have problems or questions, contact your health care provider.  What can I expect after the procedure?  After the procedure, the following side effects are common:  Pain or discomfort at the IV site.  Nausea.  Vomiting.  Sore throat.  Trouble concentrating.  Feeling cold or chills.  Weak or tired.  Sleepiness and fatigue.  Soreness and body aches. These side effects can affect parts of the body that were not involved in surgery.  Follow these instructions at home:    For at least 24 hours after the procedure:  Have a responsible adult stay with you. It is important to have someone help care for you until you are awake and alert.  Rest as needed.  Do not:  Participate in activities in which you could fall or become injured.  Drive.  Use heavy machinery.  Drink alcohol.  Take sleeping pills or medicines that cause drowsiness.  Make important decisions or sign legal documents.  Take care of children on your own.  Eating and drinking  Follow any instructions from your health care provider about eating or drinking restrictions.  When you feel hungry, start by eating small amounts of foods that are soft and easy to digest (bland), such as toast. Gradually return to your regular diet.  Drink enough fluid to keep your urine pale yellow.  If you vomit, rehydrate by drinking water, juice, or clear broth.  General instructions  If you have sleep apnea, surgery and certain medicines can increase your risk for breathing problems. Follow instructions from your health care provider about wearing your sleep device:  Anytime you are sleeping, including during daytime naps.  While taking prescription pain medicines, sleeping medicines, or medicines that make you drowsy.  Return to your normal activities as told by your health care  provider. Ask your health care provider what activities are safe for you.  Take over-the-counter and prescription medicines only as told by your health care provider.  If you smoke, do not smoke without supervision.  Keep all follow-up visits as told by your health care provider. This is important.  Contact a health care provider if:  You have nausea or vomiting that does not get better with medicine.  You cannot eat or drink without vomiting.  You have pain that does not get better with medicine.  You are unable to pass urine.  You develop a skin rash.  You have a fever.  You have redness around your IV site that gets worse.  Get help right away if:  You have difficulty breathing.  You have chest pain.  You have blood in your urine or stool, or you vomit blood.  Summary  After the procedure, it is common to have a sore throat or nausea. It is also common to feel tired.  Have a responsible adult stay with you for the first 24 hours after general anesthesia. It is important to have someone help care for you until you are awake and alert.  When you feel hungry, start by eating small amounts of foods that are soft and easy to digest (bland), such as toast. Gradually return to your regular diet.  Drink enough fluid to keep your urine pale yellow.  Return to your normal activities as told by your health care provider. Ask your health care   provider what activities are safe for you.  This information is not intended to replace advice given to you by your health care provider. Make sure you discuss any questions you have with your health care provider.  Document Released: 04/26/2000 Document Revised: 09/03/2016 Document Reviewed: 09/03/2016  Elsevier Interactive Patient Education  2019 [employer]

## 2018-04-18 NOTE — Anesthesia Preprocedure Evaluation (Deleted)
Anesthesia Evaluation    Airway        Dental   Pulmonary           Cardiovascular hypertension, + CAD    Hx portal vein DVT 2014   Neuro/Psych PSYCHIATRIC DISORDERS Anxiety    GI/Hepatic GERD  ,(+) Cirrhosis  (on Xarelto)    substance abuse  alcohol use,   Endo/Other  Hypothyroidism   Renal/GU      Musculoskeletal  (+) Arthritis ,   Abdominal   Peds  Hematology   Anesthesia Other Findings   Reproductive/Obstetrics                             Anesthesia Physical Anesthesia Plan  ASA: III  Anesthesia Plan: General   Post-op Pain Management:    Induction: Intravenous  PONV Risk Score and Plan: 2 and Dexamethasone and Ondansetron  Airway Management Planned: Oral ETT  Additional Equipment:   Intra-op Plan:   Post-operative Plan: Extubation in OR  Informed Consent:   Plan Discussed with:   Anesthesia Plan Comments:         Anesthesia Quick Evaluation

## 2018-04-20 ENCOUNTER — Ambulatory Visit: Admission: RE | Admit: 2018-04-20 | Payer: BLUE CROSS/BLUE SHIELD | Source: Home / Self Care | Admitting: Otolaryngology

## 2018-04-20 SURGERY — EXCISION, MASS, NOSE
Anesthesia: General | Laterality: Left

## 2018-12-02 ENCOUNTER — Ambulatory Visit: Admit: 2018-12-02 | Payer: BLUE CROSS/BLUE SHIELD | Admitting: Otolaryngology

## 2018-12-02 SURGERY — EXCISION, MASS, NOSE
Anesthesia: General | Laterality: Left

## 2018-12-14 DIAGNOSIS — M545 Low back pain, unspecified: Secondary | ICD-10-CM | POA: Diagnosis present

## 2018-12-14 DIAGNOSIS — G8929 Other chronic pain: Secondary | ICD-10-CM | POA: Diagnosis present

## 2020-02-25 ENCOUNTER — Telehealth: Payer: Self-pay | Admitting: *Deleted

## 2020-02-25 NOTE — Telephone Encounter (Signed)
openmed in error

## 2021-06-08 ENCOUNTER — Other Ambulatory Visit: Payer: Self-pay

## 2021-06-08 ENCOUNTER — Emergency Department
Admission: EM | Admit: 2021-06-08 | Discharge: 2021-06-08 | Disposition: A | Discharge status: Home / Self Care | Payer: BC Managed Care – PPO | Attending: Emergency Medicine | Admitting: Emergency Medicine

## 2021-06-08 DIAGNOSIS — I10 Essential (primary) hypertension: Secondary | ICD-10-CM | POA: Insufficient documentation

## 2021-06-08 DIAGNOSIS — E039 Hypothyroidism, unspecified: Secondary | ICD-10-CM | POA: Insufficient documentation

## 2021-06-08 DIAGNOSIS — M5431 Sciatica, right side: Secondary | ICD-10-CM | POA: Diagnosis not present

## 2021-06-08 DIAGNOSIS — M79606 Pain in leg, unspecified: Secondary | ICD-10-CM | POA: Diagnosis present

## 2021-06-08 DIAGNOSIS — I2510 Atherosclerotic heart disease of native coronary artery without angina pectoris: Secondary | ICD-10-CM | POA: Diagnosis not present

## 2021-06-08 MED ORDER — KETOROLAC TROMETHAMINE 30 MG/ML IJ SOLN
30.0000 mg | Freq: Once | INTRAMUSCULAR | Status: AC
  Administered 2021-06-08: 30 mg via INTRAMUSCULAR
  Filled 2021-06-08: qty 1

## 2021-06-08 MED ORDER — CYCLOBENZAPRINE HCL 10 MG PO TABS: 10.0000 mg | ORAL_TABLET | Freq: Three times a day (TID) | ORAL | 0 refills | Status: DC | PRN

## 2021-06-08 MED ORDER — DEXAMETHASONE SODIUM PHOSPHATE 10 MG/ML IJ SOLN
10.0000 mg | Freq: Once | INTRAMUSCULAR | Status: AC
  Administered 2021-06-08: 10 mg via INTRAMUSCULAR
  Filled 2021-06-08: qty 1

## 2021-06-08 MED ORDER — LIDOCAINE 5 % EX PTCH: 1.0000 | MEDICATED_PATCH | Freq: Two times a day (BID) | CUTANEOUS | 11 refills | Status: AC

## 2021-06-08 NOTE — ED Triage Notes (Signed)
Pt to ED [facility] from home for right leg and hip pain for 3-4 days. Denies recent injuries or falls.  ?

## 2021-06-08 NOTE — Discharge Instructions (Addendum)
-  Take Tylenol and lidocaine patches as needed for pain.  You may additionally take cyclobenzaprine as well for muscle relaxation, though take caution as this may make you drowsy. ?-Follow-up with your orthopedist as discussed. ?-Return to the emergency department anytime if you begin to experience any new or worsening symptoms. ?

## 2021-06-08 NOTE — ED Provider Notes (Signed)
? ?[facility] ?Provider Note ? ? ? Event Date/Time  ? First MD Initiated Contact with Patient 06/08/21 1114   ?  (approximate) ? ? ?History  ? ?Chief Complaint ?Back Pain ? ? ?HPI ?Shaun Weaver is a 61 y.o. male, history of anxiety, CAD, hypothyroidism, hypertension, alcohol cirrhosis, low back pain/sciatica, presents to the emergency department for evaluation of hip/leg pain.  Patient states that he has a long history of low back problems with numb/tingling into his right lower extremity, however over the past 3 to 4 days, it has worsened.  Denies any recent injuries or falls.  He states that he called EMS because he felt like he could barely walk.  He has been taking gabapentin, however this is only helped minimally.  Denies fever/chills, bowel/bladder dysfunction, abdominal pain, chest pain, shortness of breath, cold sensation leg, flank pain, urinary symptoms, or dizziness/lightheadedness. ? ?History Limitations: No limitations. ? ?    ? ? ?Physical Exam  ?Triage Vital Signs: ?ED Triage Vitals  ?Enc Vitals Group  ?   BP 06/08/21 1101 (!) 151/97  ?   Pulse Rate 06/08/21 1101 70  ?   Resp 06/08/21 1100 18  ?   Temp 06/08/21 1101 98 ?F (36.7 ?C)  ?   Temp src --   ?   SpO2 06/08/21 1101 93 %  ?   Weight 06/08/21 1100 270 lb (122.5 kg)  ?   Height 06/08/21 1100 6' (1.829 m)  ?   Head Circumference --   ?   Peak Flow --   ?   Pain Score 06/08/21 1100 4  ?   Pain Loc --   ?   Pain Edu? --   ?   Excl. in [place]? --   ? ? ?Most recent vital signs: ?Vitals:  ? 06/08/21 1100 06/08/21 1101  ?BP:  (!) 151/97  ?Pulse:  70  ?Resp: 18   ?Temp:  98 ?F (36.7 ?C)  ?SpO2:  93%  ? ? ?General: Awake, appears uncomfortable. ?Skin: Warm, dry. No rashes or lesions.  ?Eyes: PERRL. Conjunctivae normal.  ?CV: Good peripheral perfusion.  ?Resp: Normal effort.  ?Abd: Soft, non-tender. No distention.  ?Neuro: At baseline. No gross neurological deficits.  ? ?Focused Exam: Positive straight leg test on the right side.  No  midline spinal tenderness.  No bony tenderness along the right lower extremity.  Pulse, motor, sensation intact distally. ? ?Physical Exam ? ? ? ?ED Results / Procedures / Treatments  ?Labs ?(all labs ordered are listed, but only abnormal results are displayed) ?Labs Reviewed - No data to display ? ? ?EKG ?N/A. ? ? ?RADIOLOGY ? ?ED Provider Interpretation: N/A. ? ?No results found. ? ?PROCEDURES: ? ?Critical Care performed: N/A. ? ?Procedures ? ? ? ?MEDICATIONS ORDERED IN ED: ?Medications  ?ketorolac (TORADOL) 30 MG/ML injection 30 mg (has no administration in time range)  ?dexamethasone (DECADRON) injection 10 mg (has no administration in time range)  ? ? ? ?IMPRESSION / MDM / ASSESSMENT AND PLAN / ED COURSE  ?I reviewed the triage vital signs and the nursing notes. ?             ?               ? ?Differential diagnosis includes, but is not limited to, low back pain, sciatica, lumbar radiculopathy, lumbosacral strain. ? ?ED Course ?Patient appears well, vitals within normal limits.  NAD. ? ? ?Assessment/Plan ?Presentation consistent with sciatica.  Given his  history and physical exam, I do not suspect any serious or life-threatening pathology.  Patient has a longstanding history of sciatic nerve type pain that he is currently on gabapentin for.  I suspect he is likely experiencing a flareup.  No recent illnesses or injuries or falls.  Treated here with IM dexamethasone and IM Toradol.  We will plan to discharge him with a short course of cyclobenzaprine.  Recommend that he follow-up with his orthopedist.  ? ?Provided the patient with anticipatory guidance, return precautions, and educational material. Encouraged the patient to return to the emergency department at any time if they begin to experience any new or worsening symptoms. Patient expressed understanding and agreed with the plan.  ? ?  ? ? ?FINAL CLINICAL IMPRESSION(S) / ED DIAGNOSES  ? ?Final diagnoses:  ?Sciatica of right side  ? ? ? ?Rx / DC Orders   ? ?ED Discharge Orders   ? ?      Ordered  ?  cyclobenzaprine (FLEXERIL) 10 MG tablet  3 times daily PRN       ? 06/08/21 1202  ? ?  ?  ? ?  ? ? ? ?Note:  This document was prepared using Dragon voice recognition software and may include unintentional dictation errors. ?  ?Teodoro Spray, [place] ?06/08/21 1835 ? ?  ?Vladimir Crofts, MD ?06/10/21 0405 ? ?

## 2021-06-12 ENCOUNTER — Inpatient Hospital Stay
Admission: EM | Admit: 2021-06-12 | Discharge: 2021-06-28 | DRG: 552 | Disposition: A | Discharge status: Skilled Nursing Facility | Payer: BC Managed Care – PPO | Attending: Obstetrics and Gynecology | Admitting: Obstetrics and Gynecology

## 2021-06-12 ENCOUNTER — Emergency Department: Payer: BC Managed Care – PPO

## 2021-06-12 ENCOUNTER — Other Ambulatory Visit: Payer: Self-pay

## 2021-06-12 ENCOUNTER — Encounter: Payer: Self-pay | Admitting: Emergency Medicine

## 2021-06-12 DIAGNOSIS — R161 Splenomegaly, not elsewhere classified: Secondary | ICD-10-CM | POA: Diagnosis present

## 2021-06-12 DIAGNOSIS — R0902 Hypoxemia: Secondary | ICD-10-CM | POA: Diagnosis present

## 2021-06-12 DIAGNOSIS — I8510 Secondary esophageal varices without bleeding: Secondary | ICD-10-CM | POA: Diagnosis present

## 2021-06-12 DIAGNOSIS — I8500 Esophageal varices without bleeding: Secondary | ICD-10-CM

## 2021-06-12 DIAGNOSIS — G8929 Other chronic pain: Secondary | ICD-10-CM | POA: Diagnosis not present

## 2021-06-12 DIAGNOSIS — Z7901 Long term (current) use of anticoagulants: Secondary | ICD-10-CM

## 2021-06-12 DIAGNOSIS — E039 Hypothyroidism, unspecified: Secondary | ICD-10-CM | POA: Diagnosis present

## 2021-06-12 DIAGNOSIS — B9561 Methicillin susceptible Staphylococcus aureus infection as the cause of diseases classified elsewhere: Secondary | ICD-10-CM | POA: Diagnosis present

## 2021-06-12 DIAGNOSIS — K219 Gastro-esophageal reflux disease without esophagitis: Secondary | ICD-10-CM | POA: Diagnosis present

## 2021-06-12 DIAGNOSIS — K7030 Alcoholic cirrhosis of liver without ascites: Secondary | ICD-10-CM | POA: Diagnosis present

## 2021-06-12 DIAGNOSIS — I10 Essential (primary) hypertension: Secondary | ICD-10-CM | POA: Diagnosis present

## 2021-06-12 DIAGNOSIS — R7881 Bacteremia: Secondary | ICD-10-CM | POA: Diagnosis not present

## 2021-06-12 DIAGNOSIS — M1711 Unilateral primary osteoarthritis, right knee: Secondary | ICD-10-CM | POA: Diagnosis present

## 2021-06-12 DIAGNOSIS — L0390 Cellulitis, unspecified: Secondary | ICD-10-CM | POA: Diagnosis not present

## 2021-06-12 DIAGNOSIS — M5387 Other specified dorsopathies, lumbosacral region: Secondary | ICD-10-CM | POA: Diagnosis present

## 2021-06-12 DIAGNOSIS — M4807 Spinal stenosis, lumbosacral region: Secondary | ICD-10-CM | POA: Diagnosis present

## 2021-06-12 DIAGNOSIS — E785 Hyperlipidemia, unspecified: Secondary | ICD-10-CM | POA: Diagnosis present

## 2021-06-12 DIAGNOSIS — M48061 Spinal stenosis, lumbar region without neurogenic claudication: Secondary | ICD-10-CM | POA: Diagnosis present

## 2021-06-12 DIAGNOSIS — Z79890 Hormone replacement therapy (postmenopausal): Secondary | ICD-10-CM

## 2021-06-12 DIAGNOSIS — Z79891 Long term (current) use of opiate analgesic: Secondary | ICD-10-CM

## 2021-06-12 DIAGNOSIS — Z79899 Other long term (current) drug therapy: Secondary | ICD-10-CM

## 2021-06-12 DIAGNOSIS — D696 Thrombocytopenia, unspecified: Secondary | ICD-10-CM | POA: Diagnosis present

## 2021-06-12 DIAGNOSIS — M5441 Lumbago with sciatica, right side: Secondary | ICD-10-CM | POA: Diagnosis not present

## 2021-06-12 DIAGNOSIS — I2510 Atherosclerotic heart disease of native coronary artery without angina pectoris: Secondary | ICD-10-CM | POA: Diagnosis present

## 2021-06-12 DIAGNOSIS — M545 Low back pain, unspecified: Secondary | ICD-10-CM | POA: Diagnosis present

## 2021-06-12 DIAGNOSIS — M5116 Intervertebral disc disorders with radiculopathy, lumbar region: Principal | ICD-10-CM | POA: Diagnosis present

## 2021-06-12 DIAGNOSIS — N179 Acute kidney failure, unspecified: Secondary | ICD-10-CM | POA: Diagnosis present

## 2021-06-12 DIAGNOSIS — K760 Fatty (change of) liver, not elsewhere classified: Secondary | ICD-10-CM | POA: Diagnosis present

## 2021-06-12 DIAGNOSIS — I81 Portal vein thrombosis: Secondary | ICD-10-CM | POA: Diagnosis present

## 2021-06-12 DIAGNOSIS — G589 Mononeuropathy, unspecified: Secondary | ICD-10-CM | POA: Diagnosis present

## 2021-06-12 DIAGNOSIS — F419 Anxiety disorder, unspecified: Secondary | ICD-10-CM | POA: Diagnosis present

## 2021-06-12 DIAGNOSIS — M79604 Pain in right leg: Secondary | ICD-10-CM | POA: Diagnosis present

## 2021-06-12 DIAGNOSIS — M549 Dorsalgia, unspecified: Secondary | ICD-10-CM | POA: Diagnosis present

## 2021-06-12 DIAGNOSIS — M25561 Pain in right knee: Secondary | ICD-10-CM | POA: Diagnosis present

## 2021-06-12 LAB — CBC WITH DIFFERENTIAL/PLATELET
Abs Immature Granulocytes: 0.03 10*3/uL (ref 0.00–0.07)
Basophils Absolute: 0 10*3/uL (ref 0.0–0.1)
Basophils Relative: 0 %
Eosinophils Absolute: 0.1 10*3/uL (ref 0.0–0.5)
Eosinophils Relative: 3 %
HCT: 45.4 % (ref 39.0–52.0)
Hemoglobin: 15.3 g/dL (ref 13.0–17.0)
Immature Granulocytes: 1 %
Lymphocytes Relative: 22 %
Lymphs Abs: 1 10*3/uL (ref 0.7–4.0)
MCH: 30.7 pg (ref 26.0–34.0)
MCHC: 33.7 g/dL (ref 30.0–36.0)
MCV: 91.2 fL (ref 80.0–100.0)
Monocytes Absolute: 0.4 10*3/uL (ref 0.1–1.0)
Monocytes Relative: 9 %
Neutro Abs: 3 10*3/uL (ref 1.7–7.7)
Neutrophils Relative %: 65 %
Platelets: 75 10*3/uL — ABNORMAL LOW (ref 150–400)
RBC: 4.98 MIL/uL (ref 4.22–5.81)
RDW: 14.7 % (ref 11.5–15.5)
WBC: 4.6 10*3/uL (ref 4.0–10.5)
nRBC: 0 % (ref 0.0–0.2)

## 2021-06-12 LAB — COMPREHENSIVE METABOLIC PANEL
ALT: 24 U/L (ref 0–44)
AST: 33 U/L (ref 15–41)
Albumin: 3.5 g/dL (ref 3.5–5.0)
Alkaline Phosphatase: 77 U/L (ref 38–126)
Anion gap: 7 (ref 5–15)
BUN: 24 mg/dL — ABNORMAL HIGH (ref 8–23)
CO2: 24 mmol/L (ref 22–32)
Calcium: 9.1 mg/dL (ref 8.9–10.3)
Chloride: 107 mmol/L (ref 98–111)
Creatinine, Ser: 1.52 mg/dL — ABNORMAL HIGH (ref 0.61–1.24)
GFR, Estimated: 51 mL/min — ABNORMAL LOW (ref >60)
Glucose, Bld: 90 mg/dL (ref 70–99)
Potassium: 3.9 mmol/L (ref 3.5–5.1)
Sodium: 138 mmol/L (ref 135–145)
Total Bilirubin: 2.5 mg/dL — ABNORMAL HIGH (ref 0.3–1.2)
Total Protein: 6.8 g/dL (ref 6.5–8.1)

## 2021-06-12 LAB — MAGNESIUM: Magnesium: 2.1 mg/dL (ref 1.7–2.4)

## 2021-06-12 LAB — BASIC METABOLIC PANEL
Anion gap: 5 (ref 5–15)
BUN: 28 mg/dL — ABNORMAL HIGH (ref 8–23)
CO2: 25 mmol/L (ref 22–32)
Calcium: 9.1 mg/dL (ref 8.9–10.3)
Chloride: 107 mmol/L (ref 98–111)
Creatinine, Ser: 1.52 mg/dL — ABNORMAL HIGH (ref 0.61–1.24)
GFR, Estimated: 51 mL/min — ABNORMAL LOW (ref >60)
Glucose, Bld: 149 mg/dL — ABNORMAL HIGH (ref 70–99)
Potassium: 4.7 mmol/L (ref 3.5–5.1)
Sodium: 137 mmol/L (ref 135–145)

## 2021-06-12 LAB — LIPASE, BLOOD: Lipase: 44 U/L (ref 11–51)

## 2021-06-12 LAB — ETHANOL: Alcohol, Ethyl (B): <10 mg/dL (ref <10)

## 2021-06-12 LAB — D-DIMER, QUANTITATIVE: D-Dimer, Quant: 1.11 ug/mL-FEU — ABNORMAL HIGH (ref 0.00–0.50)

## 2021-06-12 MED ORDER — MORPHINE SULFATE (PF) 4 MG/ML IV SOLN
4.0000 mg | Freq: Once | INTRAVENOUS | Status: AC
  Administered 2021-06-12: 4 mg via INTRAVENOUS
  Filled 2021-06-12: qty 1

## 2021-06-12 MED ORDER — IOHEXOL 9 MG/ML PO SOLN
500.0000 mL | ORAL | Status: AC
  Administered 2021-06-12 (×2): 500 mL via ORAL
  Filled 2021-06-12 (×2): qty 500

## 2021-06-12 MED ORDER — ONDANSETRON HCL 4 MG/2ML IJ SOLN
4.0000 mg | Freq: Once | INTRAMUSCULAR | Status: AC
  Administered 2021-06-12: 4 mg via INTRAVENOUS
  Filled 2021-06-12: qty 2

## 2021-06-12 MED ORDER — THIAMINE HCL 100 MG/ML IJ SOLN
100.0000 mg | Freq: Every day | INTRAMUSCULAR | Status: DC
  Administered 2021-06-12: 100 mg via INTRAVENOUS
  Filled 2021-06-12: qty 2

## 2021-06-12 MED ORDER — KETOROLAC TROMETHAMINE 30 MG/ML IJ SOLN
30.0000 mg | Freq: Once | INTRAMUSCULAR | Status: AC
  Administered 2021-06-12: 30 mg via INTRAVENOUS
  Filled 2021-06-12: qty 1

## 2021-06-12 MED ORDER — PANTOPRAZOLE SODIUM 40 MG IV SOLR
40.0000 mg | Freq: Two times a day (BID) | INTRAVENOUS | Status: DC
Start: 2021-06-12 — End: 2021-06-13
  Administered 2021-06-13: 40 mg via INTRAVENOUS
  Filled 2021-06-12: qty 10

## 2021-06-12 MED ORDER — DEXAMETHASONE SODIUM PHOSPHATE 10 MG/ML IJ SOLN
10.0000 mg | Freq: Once | INTRAMUSCULAR | Status: AC
  Administered 2021-06-12: 10 mg via INTRAVENOUS
  Filled 2021-06-12: qty 1

## 2021-06-12 MED ORDER — SODIUM CHLORIDE 0.9 % IV BOLUS
1000.0000 mL | Freq: Once | INTRAVENOUS | Status: AC
  Administered 2021-06-12: 1000 mL via INTRAVENOUS

## 2021-06-12 MED ORDER — HYDROMORPHONE HCL 1 MG/ML IJ SOLN
1.0000 mg | Freq: Once | INTRAMUSCULAR | Status: AC
  Administered 2021-06-12: 1 mg via INTRAVENOUS
  Filled 2021-06-12: qty 1

## 2021-06-12 MED ORDER — IOHEXOL 300 MG/ML  SOLN
100.0000 mL | Freq: Once | INTRAMUSCULAR | Status: AC | PRN
  Administered 2021-06-12: 100 mL via INTRAVENOUS
  Filled 2021-06-12: qty 100

## 2021-06-12 MED ORDER — ORPHENADRINE CITRATE 30 MG/ML IJ SOLN
60.0000 mg | Freq: Once | INTRAMUSCULAR | Status: AC
Start: 2021-06-12 — End: 2021-06-12
  Administered 2021-06-12: 60 mg via INTRAVENOUS
  Filled 2021-06-12: qty 2

## 2021-06-12 NOTE — ED Triage Notes (Signed)
Pt presents to ED with worsening lower back pain that radiates into his right leg and into his abd. Pt states he was seen in this ED on 5/8 for the same and was told he had Sciatica and was given pain medication with no relief. Pt seen by pcp and given injection for pain without relief. Pt unable to sit still in wheelchair and states he cant walk due to his pain and is only able to roll on the ground.  ?

## 2021-06-12 NOTE — H&P (Signed)
?History and Physical  ? ? ?Patient: Shaun Weaver GOT:[id number] DOB: [birth year] ?DOA: 06/12/2021 ?DOS: the patient was seen and examined on 06/13/2021 ?PCP: Sofie Hartigan, MD  ?Patient coming from: Home ? ?Chief Complaint:  ?Chief Complaint  ?Patient presents with  ? Back Pain  ? ?HPI: Shaun Weaver is a 61 y.o. male with medical history significant of alcoholic liver cirrhosis being followed by [facility], history of alcohol abuse and patient has not had any alcohol since 2005.  Patient coming to [place] with presentations of ambulatory dysfunction and right leg weakness with ascending pain from his foot to his thigh since the last 2 months.  Intermittently patient reports sciatica symptoms going from his back to his buttocks on the right side.  Patient currently denies any pain right now.  Patient also does not report any headaches blurred vision symptoms of syncope. ?Chest pain shortness of breath palpitations. ?Upon chart review patient has elevated LFTs, thrombocytopenia, history of kidney stone, acute kidney injury today. ? ?Review of Systems  ?Cardiovascular:  Positive for leg swelling.  ?Musculoskeletal:   ?     Rt leg pain and weakness. ?  ?All other systems reviewed and are negative. ? ?Past Medical History:  ?Diagnosis Date  ? Anxiety   ? Arthritis   ? Back pain   ? Cirrhosis, alcoholic ([place])   ? Coronary artery disease   ? Deep vein thrombosis of portal vein 2014  ? [place]  ? Environmental allergies   ? GERD (gastroesophageal reflux disease)   ? H/O alcohol abuse   ? Hypertension   ? Hypothyroidism   ? Thyroid disease   ? ?Past Surgical History:  ?Procedure Laterality Date  ? ANKLE FRACTURE SURGERY Right   ? EYE SURGERY Right   ? Cataract Extraction with lens implant  ? FRACTURE SURGERY Bilateral   ? pinning right ankle  ? KNEE ARTHROSCOPY Left 02/26/2015  ? Procedure: ARTHROSCOPY Left  KNEE partial medial menisectomy;  Surgeon: Leanor Kail, MD;  Location: [facility] ORS;  Service: Orthopedics;   Laterality: Left;  ? KNEE ARTHROSCOPY Right 03/31/2015  ? Procedure: ARTHROSCOPY KNEE;  Surgeon: Leanor Kail, MD;  Location: [facility] ORS;  Service: Orthopedics;  Laterality: Right;  ? ?Social History:  reports that he has never smoked. He has never used smokeless tobacco. He reports that he does not drink alcohol and does not use drugs. ? ?No Known Allergies ? ?History reviewed. No pertinent family history. ? ?Prior to Admission medications   ?Medication Sig Start Date End Date Taking? Authorizing Provider  ?amitriptyline (ELAVIL) 25 MG tablet Take 25 mg by mouth at bedtime as needed for sleep.    [provider]  ?cyclobenzaprine (FLEXERIL) 10 MG tablet Take 1 tablet (10 mg total) by mouth 3 (three) times daily as needed for up to 5 days. 06/08/21 06/13/21  Teodoro Spray, PA  ?ergocalciferol (VITAMIN D2) 50000 units capsule Take 50,000 Units by mouth once a week. Friday    [provider]  ?fluticasone (FLONASE) 50 MCG/ACT nasal spray Place 2 sprays into both nostrils daily.    [provider]  ?gabapentin (NEURONTIN) 300 MG capsule Take 600 mg by mouth 3 (three) times daily.    [provider]  ?levothyroxine (SYNTHROID, LEVOTHROID) 100 MCG tablet Take 100 mcg by mouth daily before breakfast.    [provider]  ?lidocaine (LIDODERM) 5 % Place 1 patch onto the skin every 12 (twelve) hours. Remove & Discard patch within 12  hours or as directed by MD 06/08/21 06/08/22  Teodoro Spray, PA  ?magnesium 30 MG tablet Take 750 mg by mouth daily.     [provider]  ?mupirocin ointment (BACTROBAN) 2 % Apply two times a day for 7 days. ?Patient not taking: Reported on 04/11/2018 09/28/16   Marylene Land, NP  ?nadolol (CORGARD) 40 MG tablet Take 40 mg by mouth at bedtime.    [provider]  ?omeprazole (PRILOSEC) 20 MG capsule Take 40 mg by mouth at bedtime.     [provider]  ?rivaroxaban (XARELTO) 10 MG TABS tablet Take 10 mg by mouth daily.     [provider]  ?spironolactone (ALDACTONE) 25 MG tablet Take 25 mg by mouth daily.    [provider]  ?traZODone (DESYREL) 50 MG tablet Take 50 mg by mouth as needed for sleep.    [provider]  ? ? ?Physical Exam: ?Vitals:  ? 06/12/21 1428 06/12/21 2009 06/12/21 2223 06/13/21 0131  ?BP: 134/82 125/80 (!) 117/91 133/74  ?Pulse: 85 82 81 82  ?Resp: '18 20 18 16  '$ ?Temp: 98.4 ?F (36.9 ?C)   97.6 ?F (36.4 ?C)  ?TempSrc: Oral     ?SpO2: 96% 94% 96% 95%  ?Weight: 122.5 kg     ?Height: 6' (1.829 m)     ?Physical Exam ?Vitals and nursing note reviewed.  ?Constitutional:   ?   General: He is not in acute distress. ?   Appearance: Normal appearance. He is not ill-appearing, toxic-appearing or diaphoretic.  ?HENT:  ?   Head: Normocephalic and atraumatic.  ?   Right Ear: Hearing and external ear normal.  ?   Left Ear: Hearing and external ear normal.  ?   Nose: Nose normal. No nasal deformity.  ?   Mouth/Throat:  ?   Lips: Pink.  ?   Mouth: Mucous membranes are moist.  ?   Tongue: No lesions.  ?   Pharynx: Oropharynx is clear.  ?Eyes:  ?   Extraocular Movements: Extraocular movements intact.  ?   Pupils: Pupils are equal, round, and reactive to light.  ?Cardiovascular:  ?   Rate and Rhythm: Normal rate and regular rhythm.  ?   Pulses: Normal pulses.  ?   Heart sounds: Normal heart sounds.  ?Pulmonary:  ?   Effort: Pulmonary effort is normal.  ?   Breath sounds: Normal breath sounds.  ?Abdominal:  ?   General: Bowel sounds are normal. There is no distension.  ?   Palpations: Abdomen is soft. There is no mass.  ?   Hernia: No hernia is present.  ?Musculoskeletal:  ?   Right lower leg: No edema.  ?   Left lower leg: No edema.  ?     Legs: ? ?Skin: ?   General: Skin is warm.  ?Neurological:  ?   General: No focal deficit present.  ?   Mental Status: He is alert and oriented to person, place, and time.  ?   Cranial Nerves: Cranial nerves 2-12 are intact.  ?   Motor: Motor function is intact.   ?Psychiatric:     ?   Attention and Perception: Attention normal.     ?   Mood and Affect: Mood normal.     ?   Speech: Speech normal.     ?   Behavior: Behavior normal. Behavior is cooperative.     ?   Cognition and Memory: Cognition normal.  ? ? ?Data  Reviewed: ?Results for orders placed or performed during the hospital encounter of 06/12/21 (from the past 24 hour(s))  ?CBC with Differential     Status: Abnormal  ? Collection Time: 06/12/21  2:46 PM  ?Result Value Ref Range  ? WBC 4.6 4.0 - 10.5 K/uL  ? RBC 4.98 4.22 - 5.81 MIL/uL  ? Hemoglobin 15.3 13.0 - 17.0 g/dL  ? HCT 45.4 39.0 - 52.0 %  ? MCV 91.2 80.0 - 100.0 fL  ? MCH 30.7 26.0 - 34.0 pg  ? MCHC 33.7 30.0 - 36.0 g/dL  ? RDW 14.7 11.5 - 15.5 %  ? Platelets 75 (L) 150 - 400 K/uL  ? nRBC 0.0 0.0 - 0.2 %  ? Neutrophils Relative % 65 %  ? Neutro Abs 3.0 1.7 - 7.7 K/uL  ? Lymphocytes Relative 22 %  ? Lymphs Abs 1.0 0.7 - 4.0 K/uL  ? Monocytes Relative 9 %  ? Monocytes Absolute 0.4 0.1 - 1.0 K/uL  ? Eosinophils Relative 3 %  ? Eosinophils Absolute 0.1 0.0 - 0.5 K/uL  ? Basophils Relative 0 %  ? Basophils Absolute 0.0 0.0 - 0.1 K/uL  ? Immature Granulocytes 1 %  ? Abs Immature Granulocytes 0.03 0.00 - 0.07 K/uL  ?Comprehensive metabolic panel     Status: Abnormal  ? Collection Time: 06/12/21  2:46 PM  ?Result Value Ref Range  ? Sodium 138 135 - 145 mmol/L  ? Potassium 3.9 3.5 - 5.1 mmol/L  ? Chloride 107 98 - 111 mmol/L  ? CO2 24 22 - 32 mmol/L  ? Glucose, Bld 90 70 - 99 mg/dL  ? BUN 24 (H) 8 - 23 mg/dL  ? Creatinine, Ser 1.52 (H) 0.61 - 1.24 mg/dL  ? Calcium 9.1 8.9 - 10.3 mg/dL  ? Total Protein 6.8 6.5 - 8.1 g/dL  ? Albumin 3.5 3.5 - 5.0 g/dL  ? AST 33 15 - 41 U/L  ? ALT 24 0 - 44 U/L  ? Alkaline Phosphatase 77 38 - 126 U/L  ? Total Bilirubin 2.5 (H) 0.3 - 1.2 mg/dL  ? GFR, Estimated 51 (L) >60 mL/min  ? Anion gap 7 5 - 15  ?Lipase, blood     Status: None  ? Collection Time: 06/12/21  2:46 PM  ?Result Value Ref Range  ? Lipase 44 11 - 51 U/L  ?Magnesium      Status: None  ? Collection Time: 06/12/21  2:46 PM  ?Result Value Ref Range  ? Magnesium 2.1 1.7 - 2.4 mg/dL  ?Basic metabolic panel     Status: Abnormal  ? Collection Time: 06/12/21 10:15 PM  ?Result Value Ref

## 2021-06-12 NOTE — ED Notes (Signed)
U/S in room at this time. 

## 2021-06-12 NOTE — ED Notes (Signed)
Pt to CT

## 2021-06-12 NOTE — ED Provider Notes (Signed)
? ?[facility] ?Provider Note ? ?Patient Contact: 3:23 PM (approximate) ? ? ?History  ? ?Back Pain ? ? ?HPI ? ?Shaun Weaver is a 61 y.o. male who presents the emergency department complaining of worsening lower back pain.  Patient has been having symptoms for the past 2 months.  Patient states that roughly a week ago he mowed the grass, knew that he would be increasingly sore but the next day woke up and could barely ambulate due to the pain.  Patient was seen here in the emergency department, given a shot of Toradol and Decadron and discharged with Flexeril.  Patient states that his pain has been worsening.  He went to primary care today, was referred back to the emergency department for evaluation.  He states that he has pain so severe that he cannot ambulate at home.  He is trying to pull himself along the floors to get around his house.  Patient states he has had less intake as he is unable to ambulate to the kitchen for food and drink.  Patient states that he has no bowel or bladder dysfunction or saddle anesthesia.  Symptoms are starting in his low back rating down the right leg.  No direct trauma.  Patient denies any urinary or GI complaints. ?  ? ? ?Physical Exam  ? ?Triage Vital Signs: ?ED Triage Vitals  ?Enc Vitals Group  ?   BP 06/12/21 1428 134/82  ?   Pulse Rate 06/12/21 1428 85  ?   Resp 06/12/21 1428 18  ?   Temp 06/12/21 1428 98.4 ?F (36.9 ?C)  ?   Temp Source 06/12/21 1428 Oral  ?   SpO2 06/12/21 1428 96 %  ?   Weight 06/12/21 1428 270 lb (122.5 kg)  ?   Height 06/12/21 1428 6' (1.829 m)  ?   Head Circumference --   ?   Peak Flow --   ?   Pain Score 06/12/21 1437 10  ?   Pain Loc --   ?   Pain Edu? --   ?   Excl. in [place]? --   ? ? ?Most recent vital signs: ?Vitals:  ? 06/12/21 2009 06/12/21 2223  ?BP: 125/80 (!) 117/91  ?Pulse: 82 81  ?Resp: 20 18  ?Temp:    ?SpO2: 94% 96%  ? ? ? ?General: Alert and in no acute distress.   ?Cardiovascular:  Good peripheral perfusion ?Respiratory:  Normal respiratory effort without tachypnea or retractions. Lungs CTAB.  ?Gastrointestinal: Bowel sounds ?4 quadrants. Soft and nontender to palpation. No guarding or rigidity. No palpable masses. No distention. No CVA tenderness. ?Musculoskeletal: Full range of motion to all extremities.  Positive straight leg raise right side.  Patient has extreme tenderness around the midline and right paraspinal muscle group extending into the SI joint and sciatic notch.  Decreased range of motion to the lumbar spine at this time.  No palpable abnormalities.  Pulses and sensation intact and equal bilateral lower extremities. ?Neurologic:  No gross focal neurologic deficits are appreciated.  ?Skin:   No rash noted ?Other: ? ? ?ED Results / Procedures / Treatments  ? ?Labs ?(all labs ordered are listed, but only abnormal results are displayed) ?Labs Reviewed  ?CBC WITH DIFFERENTIAL/PLATELET - Abnormal; Notable for the following components:  ?    Result Value  ? Platelets 75 (*)   ? All other components within normal limits  ?COMPREHENSIVE METABOLIC PANEL - Abnormal; Notable for the following components:  ?  BUN 24 (*)   ? Creatinine, Ser 1.52 (*)   ? Total Bilirubin 2.5 (*)   ? GFR, Estimated 51 (*)   ? All other components within normal limits  ?BASIC METABOLIC PANEL - Abnormal; Notable for the following components:  ? Glucose, Bld 149 (*)   ? BUN 28 (*)   ? Creatinine, Ser 1.52 (*)   ? GFR, Estimated 51 (*)   ? All other components within normal limits  ?LIPASE, BLOOD  ?MAGNESIUM  ?ETHANOL  ?URINALYSIS, ROUTINE W REFLEX MICROSCOPIC  ?URINALYSIS, COMPLETE (UACMP) WITH MICROSCOPIC  ?GAMMA GT  ?D-DIMER, QUANTITATIVE  ? ? ? ?EKG ? ? ?RADIOLOGY ? ? ? ?MR LUMBAR SPINE WO CONTRAST ? ?Result Date: 06/12/2021 ?CLINICAL DATA:  Low back pain, cauda equina syndrome suspected EXAM: MRI LUMBAR SPINE WITHOUT CONTRAST TECHNIQUE: Multiplanar, multisequence MR imaging of the lumbar spine was performed. No intravenous contrast was administered.  COMPARISON:  Lumbar spine radiograph 04/25/2013, CT 03/14/2014 FINDINGS: Segmentation:  Standard. Alignment:  Physiologic. Vertebrae: No fracture, evidence of discitis, or aggressive bone lesion. Conus medullaris and cauda equina: Conus extends to the T12 level. Conus and cauda equina appear normal. Paraspinal and other soft tissues: Mild paraspinal muscle atrophy. There is bilateral renal atrophy. There are multiple small renal cysts noted. No other significant abnormality. Disc levels: T12-L1: No significant spinal canal or neural foraminal narrowing. L1-L2: Mild bilateral facet arthropathy. No significant spinal canal or neural foraminal narrowing. L2-L3: Mild bilateral facet arthropathy. No significant spinal canal or neural foraminal narrowing L3-L4: Right greater than left facet arthropathy and ligament flavum hypertrophy results in mild, left greater than right subarticular stenosis. No impingement. L4-L5: Moderate bilateral facet arthropathy with ligamentum flavum hypertrophy. There is moderate bilateral subarticular stenosis with abutment of a descending right and left L5 nerve roots (series 8, image 31). Endplate and facet spurring results in mild encroachment of the right neural foramen. L5-S1: Moderate disc height loss with asymmetric right disc bulging, ligamentum flavum hypertrophy, and bilateral facet arthropathy. There is severe right sided subarticular stenosis with impingement of the descending right S1 nerve root (series 8, image 36). Moderate right and mild left neural foraminal stenosis. IMPRESSION: Multilevel degenerative changes of the lumbar spine, most significant at L5-S1: Severe right-sided subarticular stenosis at L5-S1 with impingement of the descending right S1 nerve root. Moderate right and mild left neural foraminal stenosis at this level. Moderate bilateral subarticular stenosis at L4-L5 with abutment of the descending L5 nerve roots. Mild encroachment of the right neural foramen  without exiting nerve root impingement. Mild, left greater than right subarticular stenosis at L3-L4, but no impingement. Electronically Signed   By: Maurine Simmering M.D.   On: 06/12/2021 16:25  ? ?US Abdomen Limited RUQ (LIVER/GB) ? ?Result Date: 06/12/2021 ?CLINICAL DATA:  Upper abdominal pain EXAM: ULTRASOUND ABDOMEN LIMITED RIGHT UPPER QUADRANT COMPARISON:  None Available. FINDINGS: Gallbladder: Gallbladder is mildly decompressed with mild wall thickening to 4 mm. This may be in part due to lack of distension. Negative sonographic Murphy's sign is elicited. Common bile duct: Diameter: 8.5 mm without evidence of intrahepatic ductal dilatation. Liver: Nodularity is noted without focal mass. Portal vein is patent on color Doppler imaging with normal direction of blood flow towards the liver. Other: None. IMPRESSION: Cirrhotic changes of the liver without focal mass. Mildly prominent gallbladder wall thickening at 4 mm within negative sonographic Murphy's sign. These changes are likely related to incomplete distension. Mildly prominent common bile duct at 8.5 mm. No intrahepatic ductal dilatation is  noted. Correlate with laboratory values. Electronically Signed   By: Inez Catalina M.D.   On: 06/12/2021 22:33   ? ?PROCEDURES: ? ?Critical Care performed: No ? ?Procedures ? ? ?MEDICATIONS ORDERED IN ED: ?Medications  ?thiamine (B-1) injection 100 mg (100 mg Intravenous Given 06/12/21 2217)  ?pantoprazole (PROTONIX) injection 40 mg (has no administration in time range)  ?ondansetron (ZOFRAN) injection 4 mg (4 mg Intravenous Given 06/12/21 1537)  ?morphine (PF) 4 MG/ML injection 4 mg (4 mg Intravenous Given 06/12/21 1538)  ?orphenadrine (NORFLEX) injection 60 mg (60 mg Intravenous Given 06/12/21 1537)  ?ketorolac (TORADOL) 30 MG/ML injection 30 mg (30 mg Intravenous Given 06/12/21 1833)  ?dexamethasone (DECADRON) injection 10 mg (10 mg Intravenous Given 06/12/21 1835)  ?morphine (PF) 4 MG/ML injection 4 mg (4 mg Intravenous Given  06/12/21 1838)  ?HYDROmorphone (DILAUDID) injection 1 mg (1 mg Intravenous Given 06/12/21 2047)  ?sodium chloride 0.9 % bolus 1,000 mL (0 mLs Intravenous Stopped 06/12/21 2243)  ?iohexol (OMNIPAQUE) 9 MG/ML oral

## 2021-06-12 NOTE — ED Notes (Signed)
Pt states his pain level has not changed, states he hasn't eaten anything all day, provided with crackers and juice. ?

## 2021-06-13 DIAGNOSIS — K7030 Alcoholic cirrhosis of liver without ascites: Secondary | ICD-10-CM | POA: Diagnosis present

## 2021-06-13 DIAGNOSIS — M5387 Other specified dorsopathies, lumbosacral region: Secondary | ICD-10-CM

## 2021-06-13 DIAGNOSIS — G8929 Other chronic pain: Secondary | ICD-10-CM | POA: Diagnosis not present

## 2021-06-13 DIAGNOSIS — R7881 Bacteremia: Secondary | ICD-10-CM | POA: Diagnosis not present

## 2021-06-13 DIAGNOSIS — M25561 Pain in right knee: Secondary | ICD-10-CM | POA: Diagnosis not present

## 2021-06-13 DIAGNOSIS — N179 Acute kidney failure, unspecified: Secondary | ICD-10-CM | POA: Diagnosis present

## 2021-06-13 DIAGNOSIS — B9561 Methicillin susceptible Staphylococcus aureus infection as the cause of diseases classified elsewhere: Secondary | ICD-10-CM | POA: Diagnosis present

## 2021-06-13 DIAGNOSIS — Z79890 Hormone replacement therapy (postmenopausal): Secondary | ICD-10-CM | POA: Diagnosis not present

## 2021-06-13 DIAGNOSIS — D696 Thrombocytopenia, unspecified: Secondary | ICD-10-CM | POA: Diagnosis present

## 2021-06-13 DIAGNOSIS — I10 Essential (primary) hypertension: Secondary | ICD-10-CM | POA: Diagnosis present

## 2021-06-13 DIAGNOSIS — I8500 Esophageal varices without bleeding: Secondary | ICD-10-CM | POA: Diagnosis not present

## 2021-06-13 DIAGNOSIS — K760 Fatty (change of) liver, not elsewhere classified: Secondary | ICD-10-CM | POA: Diagnosis present

## 2021-06-13 DIAGNOSIS — E785 Hyperlipidemia, unspecified: Secondary | ICD-10-CM | POA: Diagnosis present

## 2021-06-13 DIAGNOSIS — Z79899 Other long term (current) drug therapy: Secondary | ICD-10-CM | POA: Diagnosis not present

## 2021-06-13 DIAGNOSIS — L0390 Cellulitis, unspecified: Secondary | ICD-10-CM | POA: Diagnosis not present

## 2021-06-13 DIAGNOSIS — I2510 Atherosclerotic heart disease of native coronary artery without angina pectoris: Secondary | ICD-10-CM | POA: Diagnosis present

## 2021-06-13 DIAGNOSIS — K219 Gastro-esophageal reflux disease without esophagitis: Secondary | ICD-10-CM | POA: Diagnosis present

## 2021-06-13 DIAGNOSIS — M4807 Spinal stenosis, lumbosacral region: Secondary | ICD-10-CM | POA: Diagnosis present

## 2021-06-13 DIAGNOSIS — F419 Anxiety disorder, unspecified: Secondary | ICD-10-CM | POA: Diagnosis present

## 2021-06-13 DIAGNOSIS — R0902 Hypoxemia: Secondary | ICD-10-CM | POA: Diagnosis present

## 2021-06-13 DIAGNOSIS — I81 Portal vein thrombosis: Secondary | ICD-10-CM | POA: Diagnosis present

## 2021-06-13 DIAGNOSIS — R161 Splenomegaly, not elsewhere classified: Secondary | ICD-10-CM | POA: Diagnosis present

## 2021-06-13 DIAGNOSIS — E039 Hypothyroidism, unspecified: Secondary | ICD-10-CM

## 2021-06-13 DIAGNOSIS — M5116 Intervertebral disc disorders with radiculopathy, lumbar region: Secondary | ICD-10-CM | POA: Diagnosis present

## 2021-06-13 DIAGNOSIS — M5441 Lumbago with sciatica, right side: Secondary | ICD-10-CM | POA: Diagnosis not present

## 2021-06-13 DIAGNOSIS — G589 Mononeuropathy, unspecified: Secondary | ICD-10-CM | POA: Diagnosis present

## 2021-06-13 DIAGNOSIS — M48061 Spinal stenosis, lumbar region without neurogenic claudication: Secondary | ICD-10-CM | POA: Diagnosis present

## 2021-06-13 DIAGNOSIS — M1711 Unilateral primary osteoarthritis, right knee: Secondary | ICD-10-CM | POA: Diagnosis present

## 2021-06-13 DIAGNOSIS — I8510 Secondary esophageal varices without bleeding: Secondary | ICD-10-CM | POA: Diagnosis present

## 2021-06-13 DIAGNOSIS — M5440 Lumbago with sciatica, unspecified side: Secondary | ICD-10-CM | POA: Diagnosis not present

## 2021-06-13 LAB — URINALYSIS, COMPLETE (UACMP) WITH MICROSCOPIC
Bacteria, UA: NONE SEEN
Bilirubin Urine: NEGATIVE
Glucose, UA: NEGATIVE mg/dL
Ketones, ur: NEGATIVE mg/dL
Leukocytes,Ua: NEGATIVE
Nitrite: NEGATIVE
Protein, ur: NEGATIVE mg/dL
Specific Gravity, Urine: 1.035 — ABNORMAL HIGH (ref 1.005–1.030)
Squamous Epithelial / HPF: NONE SEEN (ref 0–5)
pH: 5 (ref 5.0–8.0)

## 2021-06-13 LAB — COMPREHENSIVE METABOLIC PANEL
ALT: 23 U/L (ref 0–44)
AST: 26 U/L (ref 15–41)
Albumin: 3.2 g/dL — ABNORMAL LOW (ref 3.5–5.0)
Alkaline Phosphatase: 69 U/L (ref 38–126)
Anion gap: 5 (ref 5–15)
BUN: 27 mg/dL — ABNORMAL HIGH (ref 8–23)
CO2: 23 mmol/L (ref 22–32)
Calcium: 8.9 mg/dL (ref 8.9–10.3)
Chloride: 106 mmol/L (ref 98–111)
Creatinine, Ser: 1.33 mg/dL — ABNORMAL HIGH (ref 0.61–1.24)
GFR, Estimated: >60 mL/min (ref >60)
Glucose, Bld: 140 mg/dL — ABNORMAL HIGH (ref 70–99)
Potassium: 5.6 mmol/L — ABNORMAL HIGH (ref 3.5–5.1)
Sodium: 134 mmol/L — ABNORMAL LOW (ref 135–145)
Total Bilirubin: 1.7 mg/dL — ABNORMAL HIGH (ref 0.3–1.2)
Total Protein: 6.5 g/dL (ref 6.5–8.1)

## 2021-06-13 LAB — BASIC METABOLIC PANEL
Anion gap: 8 (ref 5–15)
BUN: 29 mg/dL — ABNORMAL HIGH (ref 8–23)
CO2: 23 mmol/L (ref 22–32)
Calcium: 9 mg/dL (ref 8.9–10.3)
Chloride: 102 mmol/L (ref 98–111)
Creatinine, Ser: 1.47 mg/dL — ABNORMAL HIGH (ref 0.61–1.24)
GFR, Estimated: 54 mL/min — ABNORMAL LOW (ref >60)
Glucose, Bld: 89 mg/dL (ref 70–99)
Potassium: 4.3 mmol/L (ref 3.5–5.1)
Sodium: 133 mmol/L — ABNORMAL LOW (ref 135–145)

## 2021-06-13 LAB — CBC
HCT: 43.4 % (ref 39.0–52.0)
Hemoglobin: 14.2 g/dL (ref 13.0–17.0)
MCH: 30.5 pg (ref 26.0–34.0)
MCHC: 32.7 g/dL (ref 30.0–36.0)
MCV: 93.1 fL (ref 80.0–100.0)
Platelets: 61 10*3/uL — ABNORMAL LOW (ref 150–400)
RBC: 4.66 MIL/uL (ref 4.22–5.81)
RDW: 14.5 % (ref 11.5–15.5)
WBC: 3.6 10*3/uL — ABNORMAL LOW (ref 4.0–10.5)
nRBC: 0 % (ref 0.0–0.2)

## 2021-06-13 LAB — HIV ANTIBODY (ROUTINE TESTING W REFLEX): HIV Screen 4th Generation wRfx: NONREACTIVE

## 2021-06-13 LAB — GAMMA GT: GGT: 34 U/L (ref 7–50)

## 2021-06-13 MED ORDER — METHYLPREDNISOLONE 4 MG PO TBPK
4.0000 mg | ORAL_TABLET | ORAL | Status: AC
  Administered 2021-06-13: 4 mg via ORAL

## 2021-06-13 MED ORDER — THIAMINE HCL 100 MG PO TABS
100.0000 mg | ORAL_TABLET | Freq: Every day | ORAL | Status: DC
  Administered 2021-06-13 – 2021-06-28 (×16): 100 mg via ORAL
  Filled 2021-06-13 (×16): qty 1

## 2021-06-13 MED ORDER — HYDROCODONE-ACETAMINOPHEN 5-325 MG PO TABS
1.0000 | ORAL_TABLET | ORAL | Status: DC | PRN
  Administered 2021-06-13 (×2): 1 via ORAL
  Filled 2021-06-13 (×2): qty 1

## 2021-06-13 MED ORDER — HYDROCODONE-ACETAMINOPHEN 5-325 MG PO TABS
1.0000 | ORAL_TABLET | ORAL | Status: DC | PRN
  Administered 2021-06-13 – 2021-06-15 (×5): 1 via ORAL
  Filled 2021-06-13 (×5): qty 1

## 2021-06-13 MED ORDER — GABAPENTIN 300 MG PO CAPS
600.0000 mg | ORAL_CAPSULE | Freq: Three times a day (TID) | ORAL | Status: DC
  Administered 2021-06-13 – 2021-06-17 (×13): 600 mg via ORAL
  Filled 2021-06-13 (×13): qty 2

## 2021-06-13 MED ORDER — METHYLPREDNISOLONE 4 MG PO TBPK
4.0000 mg | ORAL_TABLET | Freq: Four times a day (QID) | ORAL | Status: AC
Start: 2021-06-15 — End: 2021-06-18
  Administered 2021-06-15 – 2021-06-18 (×10): 4 mg via ORAL

## 2021-06-13 MED ORDER — RIVAROXABAN 10 MG PO TABS: 10.0000 mg | ORAL_TABLET | Freq: Every day | ORAL | Status: DC

## 2021-06-13 MED ORDER — KETOROLAC TROMETHAMINE 30 MG/ML IJ SOLN
30.0000 mg | Freq: Four times a day (QID) | INTRAMUSCULAR | Status: AC
  Administered 2021-06-13 – 2021-06-15 (×8): 30 mg via INTRAVENOUS
  Filled 2021-06-13 (×8): qty 1

## 2021-06-13 MED ORDER — TRAZODONE HCL 50 MG PO TABS
50.0000 mg | ORAL_TABLET | Freq: Every evening | ORAL | Status: DC | PRN
  Administered 2021-06-13 – 2021-06-21 (×5): 50 mg via ORAL
  Filled 2021-06-13 (×5): qty 1

## 2021-06-13 MED ORDER — AMITRIPTYLINE HCL 25 MG PO TABS
25.0000 mg | ORAL_TABLET | Freq: Every evening | ORAL | Status: DC | PRN
  Administered 2021-06-13 – 2021-06-14 (×2): 25 mg via ORAL
  Filled 2021-06-13 (×5): qty 1

## 2021-06-13 MED ORDER — FUROSEMIDE 10 MG/ML IJ SOLN
40.0000 mg | INTRAMUSCULAR | Status: AC
  Administered 2021-06-13: 40 mg via INTRAVENOUS
  Filled 2021-06-13: qty 4

## 2021-06-13 MED ORDER — FUROSEMIDE 10 MG/ML IJ SOLN: 40.0000 mg | Freq: Once | INTRAMUSCULAR | Status: DC

## 2021-06-13 MED ORDER — NADOLOL 40 MG PO TABS
40.0000 mg | ORAL_TABLET | Freq: Every day | ORAL | Status: DC
Start: 2021-06-13 — End: 2021-06-13

## 2021-06-13 MED ORDER — METHYLPREDNISOLONE 4 MG PO TBPK
8.0000 mg | ORAL_TABLET | Freq: Every evening | ORAL | Status: AC
Start: 2021-06-13 — End: 2021-06-13
  Administered 2021-06-13: 8 mg via ORAL

## 2021-06-13 MED ORDER — LEVOTHYROXINE SODIUM 100 MCG PO TABS
100.0000 ug | ORAL_TABLET | Freq: Every day | ORAL | Status: DC
  Administered 2021-06-13 – 2021-06-28 (×16): 100 ug via ORAL
  Filled 2021-06-13 (×16): qty 1

## 2021-06-13 MED ORDER — ACETAMINOPHEN 325 MG PO TABS: 650.0000 mg | ORAL_TABLET | Freq: Four times a day (QID) | ORAL | Status: DC | PRN

## 2021-06-13 MED ORDER — SODIUM CHLORIDE 0.9% FLUSH
3.0000 mL | Freq: Two times a day (BID) | INTRAVENOUS | Status: DC
  Administered 2021-06-13 – 2021-06-27 (×31): 3 mL via INTRAVENOUS

## 2021-06-13 MED ORDER — NADOLOL 40 MG PO TABS
40.0000 mg | ORAL_TABLET | Freq: Every day | ORAL | Status: DC
  Administered 2021-06-13: 40 mg via ORAL
  Filled 2021-06-13: qty 1

## 2021-06-13 MED ORDER — FLUTICASONE PROPIONATE 50 MCG/ACT NA SUSP
2.0000 | Freq: Every day | NASAL | Status: DC
  Administered 2021-06-14 – 2021-06-28 (×12): 2 via NASAL
  Filled 2021-06-13: qty 16

## 2021-06-13 MED ORDER — METHYLPREDNISOLONE 4 MG PO TBPK
8.0000 mg | ORAL_TABLET | Freq: Every evening | ORAL | Status: AC
  Administered 2021-06-14: 8 mg via ORAL

## 2021-06-13 MED ORDER — SPIRONOLACTONE 25 MG PO TABS: 25.0000 mg | ORAL_TABLET | Freq: Every day | ORAL | Status: DC

## 2021-06-13 MED ORDER — METHYLPREDNISOLONE 4 MG PO TBPK
8.0000 mg | ORAL_TABLET | Freq: Every morning | ORAL | Status: AC
  Administered 2021-06-13: 8 mg via ORAL
  Filled 2021-06-13: qty 21

## 2021-06-13 MED ORDER — METHYLPREDNISOLONE 4 MG PO TBPK
4.0000 mg | ORAL_TABLET | Freq: Three times a day (TID) | ORAL | Status: AC
Start: 2021-06-14 — End: 2021-06-14
  Administered 2021-06-14 (×3): 4 mg via ORAL

## 2021-06-13 MED ORDER — ACETAMINOPHEN 650 MG RE SUPP: 650.0000 mg | Freq: Four times a day (QID) | RECTAL | Status: DC | PRN

## 2021-06-13 MED ORDER — PANTOPRAZOLE SODIUM 40 MG PO TBEC
40.0000 mg | DELAYED_RELEASE_TABLET | Freq: Two times a day (BID) | ORAL | Status: DC
  Administered 2021-06-13 – 2021-06-17 (×9): 40 mg via ORAL
  Filled 2021-06-13 (×9): qty 1

## 2021-06-13 NOTE — Progress Notes (Addendum)
PHARMACIST - PHYSICIAN COMMUNICATION ? ?CONCERNING: IV to Oral Route Change Policy ? ?RECOMMENDATION: ?This patient is receiving pantoprazole, thiamine by the intravenous route.  Based on criteria approved by the Pharmacy and Therapeutics Committee, the intravenous medication(s) is/are being converted to the equivalent oral dose form(s). ? ? ?DESCRIPTION: ?These criteria include: ?The patient is eating (either orally or via tube) and/or has been taking other orally administered medications for a least 24 hours ?The patient has no evidence of active gastrointestinal bleeding or impaired GI absorption (gastrectomy, short bowel, patient on TNA or NPO). ? ?If you have questions about this conversion, please contact the Pharmacy Department  ? ?Benita Gutter, RPH ?06/13/2021 10:03 AM  ?

## 2021-06-13 NOTE — Assessment & Plan Note (Addendum)
Monitor platelet counts.  This is likely due to alcoholism ? ?75-> 61 ? ?Avoid Lovenox ? ?

## 2021-06-13 NOTE — Assessment & Plan Note (Addendum)
PPI. ?Type and screen.  ?

## 2021-06-13 NOTE — Hospital Course (Addendum)
60 year old male with alcoholic liver cirrhosis admitted for uncontrolled back pain and right sided sciatica ? ?5/13: Started Medrol Dosepak, Toradol 4 times daily.  PT, OT eval ?5/14: PT, OT recommends SNF.  TOC aware ?5/15: Right knee x-ray.  SNF search in progress ?5/16: ESI planned under IR ?

## 2021-06-13 NOTE — Assessment & Plan Note (Addendum)
Wean oxygen as able to room air ?negative dVT. ?

## 2021-06-13 NOTE — Progress Notes (Signed)
?  Progress Note ? ? ?Patient: Shaun Weaver OIN:[id number] DOB: 1960-02-03 DOA: 06/12/2021     0 ?DOS: the patient was seen and examined on 06/13/2021 ?  ?Brief hospital course: ?61 year old male with alcoholic liver cirrhosis admitted for uncontrolled back pain and right sided sciatica ? ?5/13: Started Medrol Dosepak, Toradol 4 times daily.  PT, OT eval ? ? ? ? ?Assessment and Plan: ?* Back pain ?Pt has severe DDD of lumbar spine. ?Discussed with Dr. Lacinda Axon from neurosurgery.  Recommends outpatient follow-up with orthopedic/pain management/neurosurgery ? ?Sciatica of right side associated with disorder of lumbosacral spine ?Will initiate Medrol Dosepak, ordered Toradol 4 times daily for a dose to help with the symptoms and request PT and OT to evaluate him ?Negative DVT. ? ?Hypoxia ?Wean oxygen as able to room air ?negative dVT. ? ?Alcoholic cirrhosis of liver ([place]) ?Pt no longer drinks. ?F./u with [facility]  ?Imaging in past showed cholecystitis ?His LFTs are within normal limit and total bilirubin was high but is improving.  It is possible he could have passed gallstone ? ?Esophageal varices without bleeding ([place]) ? ?  Latest Ref Rng & Units 06/12/2021  ?  2:46 PM 02/21/2015  ? 12:17 PM  ?CBC  ?WBC 4.0 - 10.5 K/uL 4.6   5.0    ?Hemoglobin 13.0 - 17.0 g/dL 15.3   14.6    ?Hematocrit 39.0 - 52.0 % 45.4   44.1    ?Platelets 150 - 400 K/uL 75   96    ?dvt prophylaxis with heparin/Lovenox held due to low platelets.  ?Scd's. ?Will follow h/h.  ?Iv ppi.  ?Continue nadolol. ? ? ?Gastro-esophageal reflux disease without esophagitis ?PPI. ?Type and screen.  ? ?Hypothyroidism, unspecified ?Continue levothyroxine.  ? ?Hypertension, essential, benign ?Continue nadolol / aldactone.  ? ? ?Thrombocytopenia ([place]) ?Monitor platelet counts.  This is likely due to alcoholism ? ?75-> 61 ? ?Avoid Lovenox ? ? ? ? ? ?  ? ?Subjective: Reports significant back pain and inability to move/walk most of his pain in right hip area ? ?Physical Exam: ?Vitals:   ? 06/12/21 2223 06/13/21 0131 06/13/21 0458 06/13/21 7096  ?BP: (!) 117/91 133/74 113/74 125/71  ?Pulse: 81 82 73 63  ?Resp: '18 16 18 18  '$ ?Temp:  98 ?F (36.7 ?C) (!) 97.5 ?F (36.4 ?C)   ?TempSrc:  Oral    ?SpO2: 96% 95% 93% 95%  ?Weight:  119.7 kg    ?Height:  6' (1.829 m)    ? ?61 year old male in no acute distress ?Lungs clear to auscultation ?Heart regular rate and rhythm ?Abdomen soft, benign ?Musculoskeletal: Positive straight leg raise on the right side, decreased range of motion of the lumbar spine due to pain ?Neuro alert and oriented, nonfocal ?Skin no rash or lesion ? ? ?Data Reviewed: ? ?Creatinine 1.33, potassium 5.6, platelets 61 ? ?Family Communication: None ? ?Disposition: ?Status is: Inpatient ?Patient will need 2 midnight stay as he is unable to move will need PT, OT eval along with Medrol Dosepak nonsteroidals and pain control he also has abnormal labs ? ? Planned Discharge Destination: Home with Home Health ? ? ? DVT prophylaxis-SCDs ?Time spent: 35 minutes ? ?Author: ?Max Sane, MD ?06/13/2021 1:56 PM ? ?For on call review [email or web address].  ?

## 2021-06-13 NOTE — Assessment & Plan Note (Addendum)
Pt no longer drinks. ?F./u with [facility]  ?Imaging in past showed cholecystitis ?His LFTs are within normal limit and total bilirubin was high but is improving.  It is possible he could have passed gallstone ?

## 2021-06-13 NOTE — Assessment & Plan Note (Addendum)
?    Latest Ref Rng & Units 06/12/2021  ?  2:46 PM 02/21/2015  ? 12:17 PM  ?CBC  ?WBC 4.0 - 10.5 K/uL 4.6   5.0    ?Hemoglobin 13.0 - 17.0 g/dL 15.3   14.6    ?Hematocrit 39.0 - 52.0 % 45.4   44.1    ?Platelets 150 - 400 K/uL 75   96    ?dvt prophylaxis with heparin/Lovenox held due to low platelets.  ?Scd's. ?Will follow h/h.  ?Iv ppi.  ?Continue nadolol. ? ?

## 2021-06-13 NOTE — Assessment & Plan Note (Signed)
Continue levothyroxine 

## 2021-06-13 NOTE — Assessment & Plan Note (Addendum)
Pt has severe DDD of lumbar spine. ?Discussed with Dr. Lacinda Axon from neurosurgery.  Recommends outpatient follow-up with orthopedic/pain management/neurosurgery ?

## 2021-06-13 NOTE — Evaluation (Signed)
Occupational Therapy Evaluation ?Patient Details ?Name: Shaun Weaver ?MRN: [id number] ?DOB: 08-Aug-1960 ?Today's Date: 06/13/2021 ? ? ?History of Present Illness 61 year old male with alcoholic liver cirrhosis admitted for uncontrolled back pain and right sided sciatica  ? ?Clinical Impression ?  ?Shaun Weaver was seen for OT evaluation this date. Prior to Weaver admission, pt was Independent for mobility and I/ADLs. Pt lives alone in mobile home c 4 STE, sister available for light housework as needed. Past week pt reports crawling on floor across house to bathroom 2/2 severe pain. Pt presents to acute OT demonstrating impaired ADL performance and functional mobility 2/2 decreased activity tolerance, pain, and functional strength/ROM/balance deficits.  ? ?Pt currently requires MAX A don B socks seated EOB. MIN A + RW sit<>stand at EOB for 2 trials, tolerates ~30 seconds and ~2 min with B forearm support, unable to fully WB on RLE 2/2 pain. SETUP seated grooming tasks - unable to complete standing as pt requires BUE support. CGA bed>chair lateral scoot with cues for hand placement and pt electing to keep RLE NWBing. Pt would benefit from skilled OT to address noted impairments and functional limitations (see below for any additional details). Upon Weaver discharge, recommend STR to maximize pt safety and return to PLOF.  ? ?Recommendations for follow up therapy are one component of a multi-disciplinary discharge planning process, led by the attending physician.  Recommendations may be updated based on patient status, additional functional criteria and insurance authorization.  ? ?Follow Up Recommendations ? Skilled nursing-short term rehab (<3 hours/day)  ?  ?Assistance Recommended at Discharge Intermittent Supervision/Assistance  ?Patient can return home with the following Two people to help with walking and/or transfers;A lot of help with bathing/dressing/bathroom;Help with stairs or ramp for entrance ? ?  ?Functional  Status Assessment ? Patient has had a recent decline in their functional status and demonstrates the ability to make significant improvements in function in a reasonable and predictable amount of time.  ?Equipment Recommendations ? BSC/3in1  ?  ?Recommendations for Other Services   ? ? ?  ?Precautions / Restrictions Precautions ?Precautions: Fall ?Restrictions ?Weight Bearing Restrictions: No  ? ?  ? ?Mobility Bed Mobility ?Overal bed mobility: Needs Assistance ?Bed Mobility: Supine to Sit ?  ?  ?Supine to sit: Supervision ?  ?  ?General bed mobility comments: increased time and use of BUEs to move RLE ?  ? ?Transfers ?Overall transfer level: Needs assistance ?Equipment used: Rolling walker (2 wheels) ?Transfers: Sit to/from Stand, Bed to chair/wheelchair/BSC ?Sit to Stand: Min assist ?  ?  ?  ?  ? Lateral/Scoot Transfers: Min guard ?  ?  ? ?  ?Balance Overall balance assessment: Needs assistance ?Sitting-balance support: No upper extremity supported, Feet supported ?Sitting balance-Leahy Scale: Fair ?  ?  ?Standing balance support: No upper extremity supported, During functional activity ?Standing balance-Leahy Scale: Poor ?  ?  ?  ?  ?  ?  ?  ?  ?  ?  ?  ?  ?   ? ?ADL either performed or assessed with clinical judgement  ? ?ADL Overall ADL's : Needs assistance/impaired ?  ?  ?  ?  ?  ?  ?  ?  ?  ?  ?  ?  ?  ?  ?  ?  ?  ?  ?  ?General ADL Comments: MAX A don B socks seated EOB. MIN A + RW for simulated BSC t/f. SETUP seated grooming tasks - unable to complete standing  as pt requires BUE support for ~2 min standing  ? ? ? ? ?Pertinent Vitals/Pain Pain Assessment ?Pain Assessment: 0-10 ?Pain Score: 9  ?Pain Location: hamstring/low back ?Pain Descriptors / Indicators: Discomfort, Dull, Grimacing, Cramping ?Pain Intervention(s): Limited activity within patient's tolerance, Repositioned, Patient requesting pain meds-RN notified  ? ? ? ?Hand Dominance   ?  ?Extremity/Trunk Assessment Upper Extremity Assessment ?Upper  Extremity Assessment: Overall WFL for tasks assessed ?  ?Lower Extremity Assessment ?Lower Extremity Assessment: RLE deficits/detail ?RLE Deficits / Details: difficulty relaxing muscles for full ROM ?RLE: Unable to fully assess due to pain ?  ?  ?  ?Communication Communication ?Communication: No difficulties ?  ?Cognition Arousal/Alertness: Awake/alert ?Behavior During Therapy: Shaun Weaver for tasks assessed/performed ?Overall Cognitive Status: Within Functional Limits for tasks assessed ?  ?  ?  ?  ?  ?  ?  ?  ?  ?  ?  ?  ?  ?  ?  ?  ?  ?  ?  ? ?Home Living Family/patient expects to be discharged to:: Private residence ?Living Arrangements: Alone ?Available Help at Discharge: Family;Available PRN/intermittently ?Type of Home: Mobile home ?Home Access: Stairs to enter ?Entrance Stairs-Number of Steps: 4 (reportedly very steep) ?  ?Home Layout: One level ?  ?  ?  ?  ?  ?  ?  ?Home Equipment: Kasandra Knudsen - single point ?  ?  ?  ? ?  ?Prior Functioning/Environment Prior Level of Function : Independent/Modified Independent ?  ?  ?  ?  ?  ?  ?  ?ADLs Comments: works on farm ?  ? ?  ?  ?OT Problem List: Decreased strength;Decreased range of motion;Decreased activity tolerance;Impaired balance (sitting and/or standing);Decreased safety awareness ?  ?   ?OT Treatment/Interventions: Self-care/ADL training;Therapeutic exercise;Energy conservation;DME and/or AE instruction;Therapeutic activities;Patient/family education;Balance training  ?  ?OT Goals(Current goals can be found in the care plan section) Acute Rehab OT Goals ?Patient Stated Goal: to improve pain ?OT Goal Formulation: With patient/family ?Time For Goal Achievement: 06/27/21 ?Potential to Achieve Goals: Good ?ADL Goals ?Pt Will Perform Grooming: with modified independence;standing ?Pt Will Perform Lower Body Dressing: with modified independence;with caregiver independent in assisting;sit to/from stand ?Pt Will Transfer to Toilet: with modified independence;ambulating;regular  height toilet  ?OT Frequency: Min 2X/week ?  ? ?Co-evaluation   ?  ?  ?  ?  ? ?  ?AM-PAC OT "6 Clicks" Daily Activity     ?Outcome Measure Help from another person eating meals?: None ?Help from another person taking care of personal grooming?: A Little ?Help from another person toileting, which includes using toliet, bedpan, or urinal?: A Little ?Help from another person bathing (including washing, rinsing, drying)?: A Lot ?Help from another person to put on and taking off regular upper body clothing?: A Little ?Help from another person to put on and taking off regular lower body clothing?: A Lot ?6 Click Score: 17 ?  ?End of Session Equipment Utilized During Treatment: Rolling walker (2 wheels) ?Nurse Communication: Mobility status;Patient requests pain meds ? ?Activity Tolerance: Patient tolerated treatment well ?Patient left: in chair;with call bell/phone within reach ? ?OT Visit Diagnosis: Other abnormalities of gait and mobility (R26.89);Muscle weakness (generalized) (M62.81)  ?              ?Time: 1610-9604 ?OT Time Calculation (min): 36 min ?Charges:  OT General Charges ?$OT Visit: 1 Visit ?OT Evaluation ?$OT Eval Moderate Complexity: 1 Mod ?OT Treatments ?$Self Care/Home Management : 23-37 mins ? ?Dessie Coma,  M.S. OTR/L  ?06/13/21, 2:22 PM  ?ascom [phone] ? ?

## 2021-06-13 NOTE — Assessment & Plan Note (Signed)
Continue nadolol / aldactone.  ? ?

## 2021-06-13 NOTE — Evaluation (Signed)
Physical Therapy Evaluation ?Patient Details ?Name: Shaun Weaver ?MRN: [id number] ?DOB: 03-Oct-1960 ?Today's Date: 06/13/2021 ? ?History of Present Illness ? 61 year old male with alcoholic liver cirrhosis admitted for uncontrolled back pain and right sided sciatica. He states his condition started about 2 months ago when he noticed his right foot hurting, but his condition got much worse in the last 2 weeks. He remembers when he got off of his ride mower last Friday his right leg really hurt.  He reports a history of neuropathy in both legs and he got a shot in his back about 15 years ago but he does not remember any episodes of pain down his right leg like he has now. He states he has not seen anyone for his condition except going to the emergency room. He states touching his leg and back aggrevates his condition and laying on the left helped ease it once. He used to love sleeping prone but hasn't been able to for the last 2 months. He tried lying prone and his leg got a lot worse. He states most recently he was crawling around his house to get around and he was using a BSC as a walker. ? ?  ?Clinical Impression ? Pateint received reclining in bed with R knee bent and is agreeable to PT evaluation. His brother and sister are present throughout the session. Patient states he got back to the bed from the chair because his pain started increasing in the chair. He states his condition started about 2 months ago when he noticed his right foot hurting, but his condition got much worse in the last 2 weeks. He remembers when he got off of his ride mower last Friday his right leg really hurt.  He reports a history of neuropathy in both legs and he got a shot in his back about 15 years ago but he does not remember any episodes of pain down his right leg like he has now. He states he has not seen anyone for his condition except going to the emergency room. He states touching his leg and back aggrevates his condition and laying on  the left helped ease it once. He used to love sleeping prone but hasn't been able to for the last 2 months. He tried lying prone and his leg got a lot worse. He states most recently he was crawling around his house to get around and he was using a BSC as a walker. Patient lives alone in a single story mobile home with 4 steps with no handrail to enter. His family is available to assist him intermittently. 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 Upon PT evaluation, patient needed min A for bed mobility and CGA-supervision for transfers from elevated bed to/from chair. Patient attempted to initiate progression for flexion deformity by trying to lie prone on two pillows but was unable to tolerate R hip extension to neutral or weight bearing on R knee to get pillows under him so this test/intervention was abandoned. Patient demonstrated good strength in R LE and appears most limited by pain. He avoids weight bearing on R LE and cannot tolerate right hip extension. Patient appears to have experienced a significant decline in functional independence and mobility and due to his very limited mobility would benefit from short term rehab prior to returning home. His deficits seem pain dominant and his function may improve rapidly if pain is controlled. Patient would benefit  from skilled physical therapy to address impairments and functional limitations (see PT Problem List below) to work towards stated goals and return to PLOF or maximal functional independence. . ?   ? ?Recommendations for follow up therapy are one component of a multi-disciplinary discharge planning process, led by the attending physician.  Recommendations may be updated based on patient status, additional functional criteria and insurance authorization. ? ?Follow Up Recommendations Skilled nursing-short term rehab (<3 hours/day) ? ?  ?Assistance Recommended at Discharge Intermittent  Supervision/Assistance  ?Patient can return home with the following ? A little help with walking and/or transfers;A little help with bathing/dressing/bathroom;Assistance with cooking/housework;Assist for transportation;Help with stairs or ramp for entrance ? ?  ?Equipment Recommendations Rolling walker (2 wheels);BSC/3in1  ?Recommendations for Other Services ?    ?  ?Functional Status Assessment Patient has had a recent decline in their functional status and demonstrates the ability to make significant improvements in function in a reasonable and predictable amount of time.  ? ?  ?Precautions / Restrictions Precautions ?Precautions: Fall ?Restrictions ?Weight Bearing Restrictions: No  ? ?  ? ?Mobility ? Bed Mobility ?Overal bed mobility: Needs Assistance ?Bed Mobility: Supine to Sit, Rolling ?Rolling: Min assist ?  ?Supine to sit: Min assist ?  ?  ?General bed mobility comments: Patient attempted to roll towards left to prone lying with two pillows under hips/abdomen but was unable to tolerate putting weight through right knee or fully extending R hip in prone. Instructed in log roll technique to come to sitting and required min A to complete sidelying to sit but also had difficulty understanding cuing while in pain. Completed sit to supine with min A for support at R LE. ?  ? ?Transfers ?Overall transfer level: Needs assistance ?Equipment used: Rolling walker (2 wheels) ?Transfers: Sit to/from Stand ?Sit to Stand: Supervision, From elevated surface ?  ?  ?  ?  ?  ?General transfer comment: Patient complete sit <> stand transfer from bed (elevated a few inches) to/from chair with RW. He needed cuing to shift weight forward. He was unable to stand from bed at lowest height second to pain. He avoided weight bearing on R LE while taking a few steps during transfer. ?  ? ?Ambulation/Gait ?  ?Gait Distance (Feet): 1 Feet ?  ?  ?Gait velocity: extremely slow ?  ?  ?General Gait Details: Patient took a few steps between  bed and chair while turning using RW and SBA. He kept his R hip slgihtly flexed and avoided weight bearing on R LE. ? ?Stairs ?  ?  ?  ?  ?  ? ?Wheelchair Mobility ?  ? ?Modified Rankin (Stroke Patients Only) ?  ? ?  ? ?Balance Overall balance assessment: Needs assistance ?Sitting-balance support: No upper extremity supported, Feet supported ?Sitting balance-Leahy Scale: Good ?  ?  ?Standing balance support: No upper extremity supported, During functional activity ?Standing balance-Leahy Scale: Poor ?Standing balance comment: requires B UE support on RW to maintain balance while standing (avoids weight bearing on R LE). ?  ?  ?  ?  ?  ?  ?  ?  ?  ?  ?  ?   ? ? ? ?Pertinent Vitals/Pain Pain Assessment ?Pain Assessment: 0-10 ?Pain Score: 9  ?Pain Location: right glute epicenter radiating to low back and down back of R leg to foot. ?Pain Descriptors / Indicators: Discomfort, Grimacing, Cramping, Constant, Moaning, Crying ?Pain Intervention(s): Limited activity within patient's tolerance, Monitored during session, Repositioned  ? ? ?  Home Living Family/patient expects to be discharged to:: Private residence ?Living Arrangements: Alone ?Available Help at Discharge: Family;Available PRN/intermittently ?Type of Home: Mobile home ?Home Access: Stairs to enter ?Entrance Stairs-Rails: None ?Entrance Stairs-Number of Steps: 4 (reportedly very steep) ?  ?Home Layout: One level ?Home Equipment: Cane - single point;Crutches ?Additional Comments: bedside commode without lid  ?  ?Prior Function Prior Level of Function : Independent/Modified Independent ?  ?  ?  ?  ?  ?  ?Mobility Comments: Independent with all aspects of care and mobility. ?ADLs Comments: works on farm ?  ? ? ?Hand Dominance  ?   ? ?  ?Extremity/Trunk Assessment  ? Upper Extremity Assessment ?Upper Extremity Assessment: Overall WFL for tasks assessed ?  ? ?Lower Extremity Assessment ?Lower Extremity Assessment: RLE deficits/detail ?RLE Deficits / Details: MMT to  right hip flexion, R knee extension, R knee flexion, R ankle dorsiflexion, R ankle eversion, R great toe extension 4/5 or greater but painful with all hip and knee movements. Patient appears unable to extend R hip to n

## 2021-06-13 NOTE — Assessment & Plan Note (Addendum)
Will initiate Medrol Dosepak, ordered Toradol 4 times daily for a dose to help with the symptoms and request PT and OT to evaluate him ?Negative DVT. ?

## 2021-06-14 DIAGNOSIS — E039 Hypothyroidism, unspecified: Secondary | ICD-10-CM | POA: Diagnosis not present

## 2021-06-14 DIAGNOSIS — M5441 Lumbago with sciatica, right side: Secondary | ICD-10-CM | POA: Diagnosis not present

## 2021-06-14 DIAGNOSIS — K7030 Alcoholic cirrhosis of liver without ascites: Secondary | ICD-10-CM | POA: Diagnosis not present

## 2021-06-14 DIAGNOSIS — M5387 Other specified dorsopathies, lumbosacral region: Secondary | ICD-10-CM | POA: Diagnosis not present

## 2021-06-14 LAB — COMPREHENSIVE METABOLIC PANEL
ALT: 21 U/L (ref 0–44)
AST: 23 U/L (ref 15–41)
Albumin: 2.9 g/dL — ABNORMAL LOW (ref 3.5–5.0)
Alkaline Phosphatase: 61 U/L (ref 38–126)
Anion gap: 3 — ABNORMAL LOW (ref 5–15)
BUN: 33 mg/dL — ABNORMAL HIGH (ref 8–23)
CO2: 24 mmol/L (ref 22–32)
Calcium: 9 mg/dL (ref 8.9–10.3)
Chloride: 107 mmol/L (ref 98–111)
Creatinine, Ser: 1.46 mg/dL — ABNORMAL HIGH (ref 0.61–1.24)
GFR, Estimated: 54 mL/min — ABNORMAL LOW (ref >60)
Glucose, Bld: 140 mg/dL — ABNORMAL HIGH (ref 70–99)
Potassium: 5.1 mmol/L (ref 3.5–5.1)
Sodium: 134 mmol/L — ABNORMAL LOW (ref 135–145)
Total Bilirubin: 0.8 mg/dL (ref 0.3–1.2)
Total Protein: 5.6 g/dL — ABNORMAL LOW (ref 6.5–8.1)

## 2021-06-14 LAB — CBC
HCT: 39.8 % (ref 39.0–52.0)
Hemoglobin: 13.4 g/dL (ref 13.0–17.0)
MCH: 30.6 pg (ref 26.0–34.0)
MCHC: 33.7 g/dL (ref 30.0–36.0)
MCV: 90.9 fL (ref 80.0–100.0)
Platelets: 64 10*3/uL — ABNORMAL LOW (ref 150–400)
RBC: 4.38 MIL/uL (ref 4.22–5.81)
RDW: 14.5 % (ref 11.5–15.5)
WBC: 6.2 10*3/uL (ref 4.0–10.5)
nRBC: 0 % (ref 0.0–0.2)

## 2021-06-14 MED ORDER — CARVEDILOL 6.25 MG PO TABS
6.2500 mg | ORAL_TABLET | Freq: Two times a day (BID) | ORAL | Status: DC
Start: 2021-06-14 — End: 2021-06-28
  Administered 2021-06-14 – 2021-06-26 (×22): 6.25 mg via ORAL
  Filled 2021-06-14 (×29): qty 1

## 2021-06-14 NOTE — Assessment & Plan Note (Signed)
Continue Medrol Dosepak and Toradol 4 times daily for a dose to help with the symptoms. PT and OT recommends SNF.  TOC aware ?Negative DVT.  We will discontinue Toradol considering kidney issues ?

## 2021-06-14 NOTE — Assessment & Plan Note (Signed)
Continue levothyroxine 

## 2021-06-14 NOTE — Assessment & Plan Note (Signed)
Monitor platelet counts.  This is likely due to alcoholism ? ?96->75-> 61-> 64 ? ?Avoid Lovenox ? ?

## 2021-06-14 NOTE — Assessment & Plan Note (Addendum)
Patient is no longer taking nadolol.  We will continue Coreg at home dosage ? ?

## 2021-06-14 NOTE — Assessment & Plan Note (Signed)
Continue Protonix °

## 2021-06-14 NOTE — Assessment & Plan Note (Signed)
?    Latest Ref Rng & Units 06/14/2021  ?  5:58 AM 06/13/2021  ?  4:24 AM 06/12/2021  ?  2:46 PM  ?CBC  ?WBC 4.0 - 10.5 K/uL 6.2   3.6   4.6    ?Hemoglobin 13.0 - 17.0 g/dL 13.4   14.2   15.3    ?Hematocrit 39.0 - 52.0 % 39.8   43.4   45.4    ?Platelets 150 - 400 K/uL 64   61   75    ?dvt prophylaxis with heparin/Lovenox held due to low platelets.  ?Scd's. ?Will follow h/h.  ?Iv ppi.  ?Continue nadolol. ? ?

## 2021-06-14 NOTE — Assessment & Plan Note (Signed)
Pt has severe DDD of lumbar spine. ?Discussed with Dr. Lacinda Axon from neurosurgery.  Recommends outpatient follow-up with orthopedic/pain management/neurosurgery ?

## 2021-06-14 NOTE — Assessment & Plan Note (Signed)
Pt no longer drinks. ?F/u with [facility]  ?Imaging in past showed cholecystitis ?His LFTs are within normal limit and total bilirubin was high but is improving.  It is possible he could have passed gallstone ?

## 2021-06-14 NOTE — Progress Notes (Signed)
?  Progress Note ? ? ?Patient: Shaun Weaver JYN:[id number] DOB: [birth year] DOA: 06/12/2021     1 ?DOS: the patient was seen and examined on 06/14/2021 ?  ?Brief hospital course: ?61 year old male with alcoholic liver cirrhosis admitted for uncontrolled back pain and right sided sciatica ? ?5/13: Started Medrol Dosepak, Toradol 4 times daily.  PT, OT eval ?5/14: PT, OT recommends SNF.  TOC aware ? ? ? ? ?Assessment and Plan: ?* Back pain ?Pt has severe DDD of lumbar spine. ?Discussed with Dr. Lacinda Axon from neurosurgery.  Recommends outpatient follow-up with orthopedic/pain management/neurosurgery ? ?Sciatica of right side associated with disorder of lumbosacral spine ?Continue Medrol Dosepak and Toradol 4 times daily for a dose to help with the symptoms. PT and OT recommends SNF.  TOC aware ?Negative DVT.  We will discontinue Toradol considering kidney issues ? ?Hypoxia ?Wean oxygen as able to room air ?negative dVT. ? ?Alcoholic cirrhosis of liver ([place]) ?Pt no longer drinks. ?F/u with [facility]  ?Imaging in past showed cholecystitis ?His LFTs are within normal limit and total bilirubin was high but is improving.  It is possible he could have passed gallstone ? ?Esophageal varices without bleeding ([place]) ? ?  Latest Ref Rng & Units 06/14/2021  ?  5:58 AM 06/13/2021  ?  4:24 AM 06/12/2021  ?  2:46 PM  ?CBC  ?WBC 4.0 - 10.5 K/uL 6.2   3.6   4.6    ?Hemoglobin 13.0 - 17.0 g/dL 13.4   14.2   15.3    ?Hematocrit 39.0 - 52.0 % 39.8   43.4   45.4    ?Platelets 150 - 400 K/uL 64   61   75    ?dvt prophylaxis with heparin/Lovenox held due to low platelets.  ?Scd's. ?Will follow h/h.  ?Iv ppi.  ?Continue nadolol. ? ? ?Gastro-esophageal reflux disease without esophagitis ?Continue Protonix ? ? ?Hypothyroidism, unspecified ?Continue levothyroxine.  ? ?Hypertension, essential, benign ?Patient is no longer taking nadolol.  We will continue Coreg at home dosage ? ? ?Thrombocytopenia ([place]) ?Monitor platelet counts.  This is likely due to  alcoholism ? ?96->75-> 61-> 63 ? ?Avoid Lovenox ? ? ? ? ? ?  ? ?Subjective: his pain is little bit better but still not able to move much ? ?Physical Exam: ?Vitals:  ? 06/13/21 1527 06/13/21 1950 06/14/21 0446 06/14/21 0800  ?BP: 124/74 109/62 (!) 112/59 128/85  ?Pulse: 86 79 (!) 56 60  ?Resp: '20 20 16 18  '$ ?Temp: 97.7 ?F (36.5 ?C) 97.9 ?F (36.6 ?C) 97.7 ?F (36.5 ?C) (!) 97.5 ?F (36.4 ?C)  ?TempSrc:  Oral Oral   ?SpO2: 94% 97% 97% 97%  ?Weight:      ?Height:      ? ?61 year old male in no acute distress ?Lungs clear to auscultation ?Heart regular rate and rhythm ?Abdomen soft, benign ?Musculoskeletal: Positive straight leg raise on the right side, decreased range of motion of the lumbar spine due to pain ?Neuro alert and oriented, nonfocal ?Skin no rash or lesion ? ?Data Reviewed: ? ?Creatinine 1.46 ? ?Family Communication: None ? ?Disposition: ?Status is: Inpatient ?Remains inpatient appropriate because: Ongoing back pain, needs SNF ? ? Planned Discharge Destination: Skilled nursing facility ? ? ? DVT prophylaxis-SCDs ?Time spent: 35 minutes ? ?Author: ?Max Sane, MD ?06/14/2021 12:37 PM ? ?For on call review [email or web address].  ?

## 2021-06-15 ENCOUNTER — Inpatient Hospital Stay: Payer: BC Managed Care – PPO

## 2021-06-15 DIAGNOSIS — M25561 Pain in right knee: Secondary | ICD-10-CM | POA: Diagnosis not present

## 2021-06-15 DIAGNOSIS — M5441 Lumbago with sciatica, right side: Secondary | ICD-10-CM | POA: Diagnosis not present

## 2021-06-15 DIAGNOSIS — E039 Hypothyroidism, unspecified: Secondary | ICD-10-CM | POA: Diagnosis not present

## 2021-06-15 DIAGNOSIS — K7030 Alcoholic cirrhosis of liver without ascites: Secondary | ICD-10-CM | POA: Diagnosis not present

## 2021-06-15 LAB — BASIC METABOLIC PANEL
Anion gap: 6 (ref 5–15)
BUN: 35 mg/dL — ABNORMAL HIGH (ref 8–23)
CO2: 24 mmol/L (ref 22–32)
Calcium: 9 mg/dL (ref 8.9–10.3)
Chloride: 105 mmol/L (ref 98–111)
Creatinine, Ser: 1.35 mg/dL — ABNORMAL HIGH (ref 0.61–1.24)
GFR, Estimated: 59 mL/min — ABNORMAL LOW (ref >60)
Glucose, Bld: 131 mg/dL — ABNORMAL HIGH (ref 70–99)
Potassium: 5.1 mmol/L (ref 3.5–5.1)
Sodium: 135 mmol/L (ref 135–145)

## 2021-06-15 MED ORDER — ACETAMINOPHEN 325 MG PO TABS
650.0000 mg | ORAL_TABLET | Freq: Four times a day (QID) | ORAL | Status: DC | PRN
  Administered 2021-06-23 – 2021-06-24 (×3): 650 mg via ORAL
  Filled 2021-06-15 (×3): qty 2

## 2021-06-15 MED ORDER — HYDROCODONE-ACETAMINOPHEN 5-325 MG PO TABS
1.0000 | ORAL_TABLET | ORAL | Status: DC | PRN
  Administered 2021-06-15 – 2021-06-28 (×36): 1 via ORAL
  Filled 2021-06-15 (×37): qty 1

## 2021-06-15 NOTE — [place] FL2 (Signed)
?[place] MEDICAID FL2 LEVEL OF CARE SCREENING TOOL  ?  ? ?IDENTIFICATION  ?Patient Name: ?Shaun Weaver Birthdate: [birth year] Sex: male Admission Date (Current Location): ?06/12/2021  ?[place] and [place] Number: ? [place] ?  Facility and Address:  ?[facility], [address], [place], [place] [address] ?     Provider Number: ?[id number]  ?Attending Physician Name and Address:  ?Max Sane, MD ? Relative Name and Phone Number:  ?Blanch Media (sister) [phone] ?   ?Current Level of Care: ?Hospital Recommended Level of Care: ?[place] Prior Approval Number: ?  ? ?Date Approved/Denied: ?  PASRR Number: ?[id number] ? ?Discharge Plan: ?SNF ?  ? ?Current Diagnoses: ?Patient Active Problem List  ? Diagnosis Date Noted  ? Thrombocytopenia ([place]) 06/13/2021  ? Hypoxia 06/12/2021  ? Back pain 06/12/2021  ? Sciatica of right side associated with disorder of lumbosacral spine 06/12/2021  ? Chronic bilateral low back pain without sciatica 12/14/2018  ? Retinal detachment of right eye with multiple breaks 07/24/2016  ? Esophageal varices without bleeding ([place]) 05/01/2014  ? Alcoholic cirrhosis of liver ([place]) 05/30/2012  ? Gastro-esophageal reflux disease without esophagitis 05/30/2012  ? Hyperlipemia 05/30/2012  ? Hypothyroidism, unspecified 05/30/2012  ? Hypertension, essential, benign 05/30/2012  ? ? ?Orientation RESPIRATION BLADDER Height & Weight   ?  ?Self, Time, Situation, Place ? Normal Continent Weight: 270 lb 1 oz (122.5 kg) ?Height:  6' (182.9 cm)  ?BEHAVIORAL SYMPTOMS/MOOD NEUROLOGICAL BOWEL NUTRITION STATUS  ?    Continent Diet (see discharge summary)  ?AMBULATORY STATUS COMMUNICATION OF NEEDS Skin   ?Limited Assist Verbally Normal ?  ?  ?  ?    ?     ?     ? ? ?Personal Care Assistance Level of Assistance  ?Bathing, Feeding, Dressing, Total care Bathing Assistance: Limited assistance ?Feeding assistance: Independent ?Dressing Assistance: Limited assistance ?Total Care  Assistance: Limited assistance  ? ?Functional Limitations Info  ?Sight, Hearing, Speech Sight Info: Impaired ?Hearing Info: Adequate ?Speech Info: Adequate  ? ? ?SPECIAL CARE FACTORS FREQUENCY  ?PT (By licensed PT), OT (By licensed OT)   ?  ?PT Frequency: min 4x weekly ?OT Frequency: min 4x weekly ?  ?  ?  ?   ? ? ?Contractures Contractures Info: Not present  ? ? ?Additional Factors Info  ?Code Status, Allergies Code Status Info: full ?Allergies Info: No Known Allergies ?  ?  ?  ?   ? ?Current Medications (06/15/2021):  This is the current hospital active medication list ?Current Facility-Administered Medications  ?Medication Dose Route Frequency Provider Last Rate Last Admin  ? acetaminophen (TYLENOL) tablet 650 mg  650 mg Oral Q6H PRN Max Sane, MD      ? amitriptyline (ELAVIL) tablet 25 mg  25 mg Oral QHS PRN Para Skeans, MD   25 mg at 06/14/21 2117  ? carvedilol (COREG) tablet 6.25 mg  6.25 mg Oral BID WC Max Sane, MD   6.25 mg at 06/15/21 1018  ? fluticasone (FLONASE) 50 MCG/ACT nasal spray 2 spray  2 spray Each Nare Daily Para Skeans, MD   2 spray at 06/15/21 1019  ? gabapentin (NEURONTIN) capsule 600 mg  600 mg Oral TID Para Skeans, MD   600 mg at 06/15/21 1018  ? HYDROcodone-acetaminophen (NORCO/VICODIN) 5-325 MG per tablet 1 tablet  1 tablet Oral Q4H PRN Max Sane, MD   1 tablet at 06/15/21 1437  ? levothyroxine (SYNTHROID) tablet 100 mcg  100 mcg Oral QAC  breakfast Para Skeans, MD   100 mcg at 06/15/21 1537  ? methylPREDNISolone (MEDROL DOSEPAK) tablet 4 mg  4 mg Oral 4X daily taper Max Sane, MD   4 mg at 06/15/21 1337  ? pantoprazole (PROTONIX) EC tablet 40 mg  40 mg Oral BID Benita Gutter, RPH   40 mg at 06/15/21 1019  ? sodium chloride flush (NS) 0.9 % injection 3 mL  3 mL Intravenous Q12H Para Skeans, MD   3 mL at 06/15/21 1020  ? thiamine tablet 100 mg  100 mg Oral Daily Benita Gutter, RPH   100 mg at 06/15/21 1018  ? traZODone (DESYREL) tablet 50 mg  50 mg Oral QHS PRN Para Skeans, MD   50 mg at 06/14/21 2116  ? ? ? ?Discharge Medications: ?Please see discharge summary for a list of discharge medications. ? ?Relevant Imaging Results: ? ?Relevant Lab Results: ? ? ?Additional Information ?SSN:[id number] ? ?Alberteen Sam, LCSW ? ? ? ? ?

## 2021-06-15 NOTE — Assessment & Plan Note (Addendum)
He is on room air now ?negative dVT. ?

## 2021-06-15 NOTE — Assessment & Plan Note (Signed)
While working with therapist - patient is keeping RLE non weight bearing and preferring to hopping around, and cant tolerate being up and moving for more than a minute or two. ? ?We will obtain knee x-ray and consult Ortho ?

## 2021-06-15 NOTE — Assessment & Plan Note (Signed)
Continue levothyroxine 

## 2021-06-15 NOTE — Progress Notes (Signed)
Occupational Therapy Treatment ?Patient Details ?Name: Shaun Weaver ?MRN: [id number] ?DOB: Jun 22, 1960 ?Today's Date: 06/15/2021 ? ? ?History of present illness 61 year old male with alcoholic liver cirrhosis admitted for uncontrolled back pain and right sided sciatica. He states his condition started about 2 months ago when he noticed his right foot hurting, but his condition got much worse in the last 2 weeks. He remembers when he got off of his ride mower last Friday his right leg really hurt.  He reports a history of neuropathy in both legs and he got a shot in his back about 15 years ago but he does not remember any episodes of pain down his right leg like he has now. He states he has not seen anyone for his condition except going to the emergency room. He states touching his leg and back aggrevates his condition and laying on the left helped ease it once. He used to love sleeping prone but hasn't been able to for the last 2 months. He tried lying prone and his leg got a lot worse. He states most recently he was crawling around his house to get around and he was using a BSC as a walker. ?  ?OT comments ? Mr Candelas was seen for OT treatment on this date. Upon arrival to room pt reclined in bed, agreeable to tx. Pt tolerated seated figure 4 stretch and bed level hamstring/low back stretch in supine - reviewed HEP. Pt requires CGA sit<>stand decreasing to MIN A for ~5 steps in room with minimal Wbing on RLE. Pt left with NT in room. Pt making progress toward goals. Will continue to follow POC. Discharge recommendation remains appropriate.  ?  ? ?Recommendations for follow up therapy are one component of a multi-disciplinary discharge planning process, led by the attending physician.  Recommendations may be updated based on patient status, additional functional criteria and insurance authorization. ?   ?Follow Up Recommendations ? Skilled nursing-short term rehab (<3 hours/day)  ?  ?Assistance Recommended at Discharge  Intermittent Supervision/Assistance  ?Patient can return home with the following ? Two people to help with walking and/or transfers;A lot of help with bathing/dressing/bathroom;Help with stairs or ramp for entrance ?  ?Equipment Recommendations ? BSC/3in1  ?  ?Recommendations for Other Services   ? ?  ?Precautions / Restrictions Precautions ?Precautions: Fall ?Restrictions ?Weight Bearing Restrictions: No  ? ? ?  ? ?Mobility Bed Mobility ?Overal bed mobility: Modified Independent ?  ?  ?  ?  ?  ?  ?  ?  ? ?Transfers ?Overall transfer level: Needs assistance ?Equipment used: Rolling walker (2 wheels) ?Transfers: Sit to/from Stand ?Sit to Stand: Min guard ?  ?  ?Step pivot transfers: Min assist ?  ?  ?General transfer comment: poor tolerance 2/2 pain, self-elects touchdown WBing only ?  ?  ?Balance Overall balance assessment: Needs assistance ?Sitting-balance support: Feet supported ?Sitting balance-Leahy Scale: Good ?  ?  ?Standing balance support: Bilateral upper extremity supported, Reliant on assistive device for balance ?Standing balance-Leahy Scale: Poor ?  ?  ?  ?  ?  ?  ?  ?  ?  ?  ?  ?  ?   ? ?ADL either performed or assessed with clinical judgement  ? ?ADL Overall ADL's : Needs assistance/impaired ?  ?  ?  ?  ?  ?  ?  ?  ?  ?  ?  ?  ?  ?  ?  ?  ?  ?  ?  ?  General ADL Comments: MIN A + RW for simulated BSC t/f - assist for standing balance. ?  ? ? ? ?Cognition Arousal/Alertness: Awake/alert ?Behavior During Therapy: [facility] for tasks assessed/performed ?Overall Cognitive Status: Within Functional Limits for tasks assessed ?  ?  ?  ?  ?  ?  ?  ?  ?  ?  ?  ?  ?  ?  ?  ?  ?General Comments: decreased safety awareness ?  ?  ?   ?Exercises Other Exercises ?Other Exercises: seated figure 4 stretch and bed level hamstring/low back stretch in supine - reviewed HEP ? ?  ?   ?   ? ? ?Pertinent Vitals/ Pain       Pain Assessment ?Pain Assessment: 0-10 ?Pain Score: 10-Worst pain ever ?Pain Location: right glute epicenter  radiating to low back and down back of R leg to foot. ?Pain Descriptors / Indicators: Discomfort, Grimacing, Cramping, Constant, Moaning, Crying ?Pain Intervention(s): Limited activity within patient's tolerance, Repositioned ? ? ?Frequency ? Min 2X/week  ? ? ? ? ?  ?Progress Toward Goals ? ?OT Goals(current goals can now be found in the care plan section) ? Progress towards OT goals: Progressing toward goals ? ?Acute Rehab OT Goals ?Patient Stated Goal: to improve pain ?OT Goal Formulation: With patient ?Time For Goal Achievement: 06/27/21 ?Potential to Achieve Goals: Good ?ADL Goals ?Pt Will Perform Grooming: with modified independence;standing ?Pt Will Perform Lower Body Dressing: with modified independence;with caregiver independent in assisting;sit to/from stand ?Pt Will Transfer to Toilet: with modified independence;ambulating;regular height toilet  ?Plan Discharge plan remains appropriate;Frequency remains appropriate   ? ?Co-evaluation ? ? ?   ?  ?  ?  ?  ? ?  ?AM-PAC OT "6 Clicks" Daily Activity     ?Outcome Measure ? ? Help from another person eating meals?: None ?Help from another person taking care of personal grooming?: A Little ?Help from another person toileting, which includes using toliet, bedpan, or urinal?: A Little ?Help from another person bathing (including washing, rinsing, drying)?: A Lot ?Help from another person to put on and taking off regular upper body clothing?: A Little ?Help from another person to put on and taking off regular lower body clothing?: A Lot ?6 Click Score: 17 ? ?  ?End of Session Equipment Utilized During Treatment: Rolling walker (2 wheels) ? ?OT Visit Diagnosis: Other abnormalities of gait and mobility (R26.89);Muscle weakness (generalized) (M62.81) ?  ?Activity Tolerance Patient tolerated treatment well ?  ?Patient Left in bed;with call bell/phone within reach;with nursing/sitter in room ?  ?Nurse Communication   ?  ? ?   ? ?Time: 0160-1093 ?OT Time Calculation (min):  12 min ? ?Charges: OT General Charges ?$OT Visit: 1 Visit ?OT Treatments ?$Therapeutic Exercise: 8-22 mins ? ?Dessie Coma, M.S. OTR/L  ?06/15/21, 3:29 PM  ?ascom [phone] ? ?

## 2021-06-15 NOTE — Consult Note (Signed)
?ORTHOPAEDIC CONSULTATION ? ?REQUESTING PHYSICIAN: Max Sane, MD ? ?Chief Complaint: Right knee/lower extremity pain ? ?HPI: ?Shaun Weaver is a 61 y.o. male who complains of right lower extremity pain.  Patient states the pain is primarily in the posterior hip rating down the posterior thigh.  He states the pain extends into his right foot.  He states he is unable to put full weight on his right lower extremity due to the pain.  Patient states he is seeing a back specialist at [facility] and has been given Neurontin in the past.  He has seen Dr. Roland Rack previously for knee pain and has been told he has arthritis in his knees.  Patient states that he is not having pain specifically in his knee at this time and denies any significant pain with knee motion.  Patient had an MRI of the lumbar spine performed on 06/12/2021 showing multi level degenerative changes of the lumbar spine most significant at L5-S1.  There is severe right-sided subarticular stenosis at L5-S1 with impingement of the descending right S1 nerve root.  He has moderate right and mild left and right neuroforaminal stenosis at this level as well.  Patient has moderate bilateral subarticular stenosis at L4-5 with abutment of the descending L5 nerve root as well. ? ?Past Medical History:  ?Diagnosis Date  ? Anxiety   ? Arthritis   ? Back pain   ? Cirrhosis, alcoholic ([place])   ? Coronary artery disease   ? Deep vein thrombosis of portal vein 2014  ? [place]  ? Environmental allergies   ? GERD (gastroesophageal reflux disease)   ? H/O alcohol abuse   ? Hypertension   ? Hypothyroidism   ? Thyroid disease   ? ?Past Surgical History:  ?Procedure Laterality Date  ? ANKLE FRACTURE SURGERY Right   ? EYE SURGERY Right   ? Cataract Extraction with lens implant  ? FRACTURE SURGERY Bilateral   ? pinning right ankle  ? KNEE ARTHROSCOPY Left 02/26/2015  ? Procedure: ARTHROSCOPY Left  KNEE partial medial menisectomy;  Surgeon: Leanor Kail, MD;  Location: [facility] ORS;   Service: Orthopedics;  Laterality: Left;  ? KNEE ARTHROSCOPY Right 03/31/2015  ? Procedure: ARTHROSCOPY KNEE;  Surgeon: Leanor Kail, MD;  Location: [facility] ORS;  Service: Orthopedics;  Laterality: Right;  ? ?Social History  ? ?Socioeconomic History  ? Marital status: Married  ?  Spouse name: Not on file  ? Number of children: Not on file  ? Years of education: Not on file  ? Highest education level: Not on file  ?Occupational History  ? Not on file  ?Tobacco Use  ? Smoking status: Never  ? Smokeless tobacco: Never  ?Vaping Use  ? Vaping Use: Never used  ?Substance and Sexual Activity  ? Alcohol use: No  ? Drug use: No  ? Sexual activity: Not on file  ?Other Topics Concern  ? Not on file  ?Social History Narrative  ? Not on file  ? ?Social Determinants of Health  ? ?Financial Resource Strain: Not on file  ?Food Insecurity: Not on file  ?Transportation Needs: Not on file  ?Physical Activity: Not on file  ?Stress: Not on file  ?Social Connections: Not on file  ? ?History reviewed. No pertinent family history. ?No Known Allergies ?Prior to Admission medications   ?Medication Sig Start Date End Date Taking? Authorizing Provider  ?amitriptyline (ELAVIL) 25 MG tablet Take 25 mg by mouth at bedtime as needed for sleep.   Yes [provider]  ?carvedilol (COREG) 6.25 MG tablet Take 6.25 mg by mouth 2 (two) times daily with a meal. 11/28/20  Yes [provider]  ?celecoxib (CELEBREX) 200 MG capsule Take by mouth. 05/18/21 06/17/21 Yes [provider]  ?furosemide (LASIX) 20 MG tablet Take 20 mg by mouth daily. 04/18/21  Yes [provider]  ?amitriptyline (ELAVIL) 25 MG tablet Take 1 tablet by mouth at bedtime. 05/14/21   [provider]  ?ergocalciferol (VITAMIN D2) 50000 units capsule Take 50,000 Units by mouth once a week. Friday    [provider]  ?fluticasone (FLONASE) 50 MCG/ACT nasal spray Place 2 sprays into both nostrils daily.    [provider]   ?gabapentin (NEURONTIN) 300 MG capsule Take 600 mg by mouth 3 (three) times daily.    [provider]  ?levothyroxine (SYNTHROID, LEVOTHROID) 100 MCG tablet Take 100 mcg by mouth daily before breakfast.    [provider]  ?lidocaine (LIDODERM) 5 % Place 1 patch onto the skin every 12 (twelve) hours. Remove & Discard patch within 12 hours or as directed by MD 06/08/21 06/08/22  Teodoro Spray, PA  ?magnesium 30 MG tablet Take 750 mg by mouth daily.     [provider]  ?mupirocin ointment (BACTROBAN) 2 % Apply two times a day for 7 days. ?Patient not taking: Reported on 04/11/2018 09/28/16   Marylene Land, NP  ?nadolol (CORGARD) 40 MG tablet Take 40 mg by mouth at bedtime. ?Patient not taking: Reported on 06/14/2021    [provider]  ?omeprazole (PRILOSEC) 20 MG capsule Take 40 mg by mouth at bedtime.     [provider]  ?rivaroxaban (XARELTO) 10 MG TABS tablet Take 10 mg by mouth daily.    [provider]  ?spironolactone (ALDACTONE) 25 MG tablet Take 25 mg by mouth daily. ?Patient not taking: Reported on 06/14/2021    [provider]  ?traZODone (DESYREL) 50 MG tablet Take 50 mg by mouth as needed for sleep.    [provider]  ? ?DG Knee Complete 4 Views Right ? ?Result Date: 06/15/2021 ?CLINICAL DATA:  Pain EXAM: RIGHT KNEE - COMPLETE 4 VIEW COMPARISON:  None Available. FINDINGS: No recent fracture or dislocation is seen. There is no effusion. Degenerative changes are noted with bony spurs in medial, lateral and patellofemoral compartments. IMPRESSION: No recent fracture or dislocation is seen. There is no significant effusion. Moderate degenerative changes are noted. Electronically Signed   By: Elmer Picker M.D.   On: 06/15/2021 17:19   ? ?Positive ROS: All other systems have been reviewed and were otherwise negative with the exception of those mentioned in the HPI and as above. ? ?Physical Exam: ?General: Alert, no acute  distress ? ?MUSCULOSKELETAL: Right knee/lower extremity: Patient has intact skin overlying the right knee.  There is no erythema ecchymosis or effusion.  His range of motion is from 0 to 120 degrees without pain.  He had no tenderness to palpation over the medial or lateral joint lines.  Patient's leg compartments are soft and compressible.  He has palpable pedal pulses, intact sensation light touch and intact motor function distally without weakness. ? ?Assessment: ?Right lower extremity pain secondary to lumbar radiculopathy ?Asymptomatic right knee osteoarthritis ? ?Plan: ?Patient is having no specific knee pain at this time.  He demonstrates full range of motion without pain tonight on exam.  He is having no tenderness.  There is no erythema ecchymosis or effusion to suggest active inflammation.  I  believe his symptoms are related to lumbar radiculopathy.  Dr. Cari Caraway, from neurosurgery, has seen the patient and concurs with this assessment.  Patient has alcoholic liver cirrhosis and is high risk for surgery.  He recommended trying an epidural steroid injection.  No treatment is needed currently for the patient's right knee.  Continue oral steroid taper as ordered. ? ? ? ?Thornton Park, MD ? ? ? ?06/15/2021 ?7:37 PM ? ?  ?

## 2021-06-15 NOTE — Assessment & Plan Note (Signed)
Patient is no longer taking nadolol.   continue Coreg  ?

## 2021-06-15 NOTE — Consult Note (Signed)
? ?Referring Physician:  ?No referring provider defined for this encounter. ? ?Primary Physician:  ?Sofie Hartigan, MD ? ?Chief Complaint:  right leg pain ? ?History of Present Illness: ?06/15/2021 ?Shaun Weaver is a 61 y.o. male who presents with the chief complaint of right leg pain.  He has had 6-8 weeks of worsening R leg pain primarily into his buttock and posterior thigh.  He denies weakness. He has difficulty walking.  His pain has caused dramatic worsening of his quality of life. ? ?Bowel/Bladder: no dysfunction ? ?Shaun Weaver has no symptoms of cervical myelopathy. ? ?The symptoms are causing a significant impact on the patient's life.  ? ?Review of Systems:  ?A 10 point review of systems is negative, except for the pertinent positives and negatives detailed in the HPI. ? ?Past Medical History: ?Past Medical History:  ?Diagnosis Date  ? Anxiety   ? Arthritis   ? Back pain   ? Cirrhosis, alcoholic ([place])   ? Coronary artery disease   ? Deep vein thrombosis of portal vein 2014  ? [place]  ? Environmental allergies   ? GERD (gastroesophageal reflux disease)   ? H/O alcohol abuse   ? Hypertension   ? Hypothyroidism   ? Thyroid disease   ? ? ?Past Surgical History: ?Past Surgical History:  ?Procedure Laterality Date  ? ANKLE FRACTURE SURGERY Right   ? EYE SURGERY Right   ? Cataract Extraction with lens implant  ? FRACTURE SURGERY Bilateral   ? pinning right ankle  ? KNEE ARTHROSCOPY Left 02/26/2015  ? Procedure: ARTHROSCOPY Left  KNEE partial medial menisectomy;  Surgeon: Leanor Kail, MD;  Location: [facility] ORS;  Service: Orthopedics;  Laterality: Left;  ? KNEE ARTHROSCOPY Right 03/31/2015  ? Procedure: ARTHROSCOPY KNEE;  Surgeon: Leanor Kail, MD;  Location: [facility] OR[facility];  Service: Orthopedics;  Laterality: Right;  ? ? ?Allergies: ?Allergies as of 06/12/2021  ? (No Known Allergies)  ? ? ?Medications: ? ?Current Facility-Administered Medications:  ?  acetaminophen (TYLENOL) tablet 650 mg, 650 mg, Oral,  Q6H PRN **OR** [DISCONTINUED] acetaminophen (TYLENOL) suppository 650 mg, 650 mg, Rectal, Q6H PRN, Para Skeans, MD ?  amitriptyline (ELAVIL) tablet 25 mg, 25 mg, Oral, QHS PRN, Para Skeans, MD, 25 mg at 06/14/21 2117 ?  carvedilol (COREG) tablet 6.25 mg, 6.25 mg, Oral, BID WC, Manuella Ghazi, Vipul, MD, 6.25 mg at 06/15/21 1738 ?  fluticasone (FLONASE) 50 MCG/ACT nasal spray 2 spray, 2 spray, Each Nare, Daily, Para Skeans, MD, 2 spray at 06/15/21 1019 ?  gabapentin (NEURONTIN) capsule 600 mg, 600 mg, Oral, TID, Para Skeans, MD, 600 mg at 06/15/21 1738 ?  HYDROcodone-acetaminophen (NORCO/VICODIN) 5-325 MG per tablet 1 tablet, 1 tablet, Oral, Q4H PRN, Max Sane, MD, 1 tablet at 06/15/21 1737 ?  levothyroxine (SYNTHROID) tablet 100 mcg, 100 mcg, Oral, QAC breakfast, Para Skeans, MD, 100 mcg at 06/15/21 0526 ?  methylPREDNISolone (MEDROL DOSEPAK) tablet 4 mg, 4 mg, Oral, 4X daily taper, Max Sane, MD, 4 mg at 06/15/21 1740 ?  pantoprazole (PROTONIX) EC tablet 40 mg, 40 mg, Oral, BID, Benita Gutter, RPH, 40 mg at 06/15/21 1019 ?  sodium chloride flush (NS) 0.9 % injection 3 mL, 3 mL, Intravenous, Q12H, Para Skeans, MD, 3 mL at 06/15/21 1020 ?  thiamine tablet 100 mg, 100 mg, Oral, Daily, Benita Gutter, RPH, 100 mg at 06/15/21 1018 ?  traZODone (DESYREL) tablet 50 mg, 50 mg, Oral, QHS PRN, Florina Ou  V, MD, 50 mg at 06/14/21 2116 ? ? ?Social History: ?Social History  ? ?Tobacco Use  ? Smoking status: Never  ? Smokeless tobacco: Never  ?Vaping Use  ? Vaping Use: Never used  ?Substance Use Topics  ? Alcohol use: No  ? Drug use: No  ? ? ?Family Medical History: ?History reviewed. No pertinent family history. ? ?Physical Examination: ?Vitals:  ? 06/15/21 0839 06/15/21 1521  ?BP: 123/68 (!) 155/84  ?Pulse: (!) 52 63  ?Resp:  18  ?Temp: 97.7 ?F (36.5 ?C) 97.7 ?F (36.5 ?C)  ?SpO2: 98% 96%  ? ? ? ?General: Patient is well developed, well nourished, calm, collected, and in no apparent distress. ? ?Psychiatric: Patient  is non-anxious. ? ?Head:  Pupils equal, round, and reactive to light. ? ?ENT:  Oral mucosa appears well hydrated. ? ?Neck:   Supple.  Full range of motion. ? ?Respiratory: Patient is breathing without any difficulty. ? ?Extremities: No edema. ? ?Vascular: Palpable pulses in dorsal pedal vessels. ? ?Skin:   On exposed skin, there are no abnormal skin lesions. ? ?NEUROLOGICAL:  ?General: In no acute distress.   ?Awake, alert, oriented to person, place, and time.  Pupils equal round and reactive to light.  Facial tone is symmetric.  Tongue protrusion is midline.  There is no pronator drift. ? ?ROM of spine: full. ?Strength: ?Side Biceps Triceps Deltoid Interossei Grip Wrist Ext. Wrist Flex.  ?R '5 5 5 5 5 5 5  '$ ?L '5 5 5 5 5 5 5  '$ ? ?Side Iliopsoas Quads Hamstring PF DF EHL  ?R '5 5 5 5 5 5  '$ ?L '5 5 5 5 5 5  '$ ? ? ?Bilateral upper and lower extremity sensation is intact to light touch. ?Reflexes are 1+ and symmetric at the biceps, triceps, brachioradialis, patella and achilles. Hoffman's is absent. ? ?Clonus is not present.  Toes are down-going.   ? ?Gait is untested. ?Imaging: ?MRI L spine 06/12/2021 ?IMPRESSION: ?Multilevel degenerative changes of the lumbar spine, most ?significant at L5-S1: ?  ?Severe right-sided subarticular stenosis at L5-S1 with impingement ?of the descending right S1 nerve root. Moderate right and mild left ?neural foraminal stenosis at this level. ?  ?Moderate bilateral subarticular stenosis at L4-L5 with abutment of ?the descending L5 nerve roots. Mild encroachment of the right neural ?foramen without exiting nerve root impingement. ?  ?Mild, left greater than right subarticular stenosis at L3-L4, but no ?impingement. ?  ?  ?Electronically Signed ?  By: Maurine Simmering M.D. ?  On: 06/12/2021 16:25 ? ?I have personally reviewed the images and agree with the above interpretation. ? ?Labs: ? ?  Latest Ref Rng & Units 06/14/2021  ?  5:58 AM 06/13/2021  ?  4:24 AM 06/12/2021  ?  2:46 PM  ?CBC  ?WBC 4.0 - 10.5  K/uL 6.2   3.6   4.6    ?Hemoglobin 13.0 - 17.0 g/dL 13.4   14.2   15.3    ?Hematocrit 39.0 - 52.0 % 39.8   43.4   45.4    ?Platelets 150 - 400 K/uL 64   61   75    ? ? ? ? ? ?Assessment and Plan: ?Mr. Waymire is a pleasant 61 y.o. male with right sided radiculopathy. He is slightly better with pain management. He has liver dysfunction due to liver disease, and has thrombocytopenia. He is a high risk surgical candidate. ? ?I would recommend trying and ESI before considering surgery. ? ? ? ?[employer]  Vincente Poli MD, MPHS ?Dept. of Neurosurgery ?  ? ?

## 2021-06-15 NOTE — TOC Initial Note (Signed)
Transition of Care (TOC) - Initial/Assessment Note  ? ? ?Patient Details  ?Name: Shaun Weaver ?MRN: [id number] ?Date of Birth: [birth year] ? ?Transition of Care (TOC) CM/SW Contact:    ?Beverly Sessions, RN ?Phone Number: ?06/15/2021, 4:13 PM ? ?Clinical Narrative:                 ?Admitted for: back pain/leg pain ?Admitted from:Home alone  ?PCP: [facility] ?[facility] ?Current home health/prior home health/DME: crutches  ? ? ? ?Therapy recommending SNF patient agreeable . Bed offers presented.  Patient accepts bed offer at [facility].  Accepted bed in [place].  [facility]avella Lemons at [facility] notified and she is to start [place] today ? ? ?Expected Discharge Plan: [place] ?Barriers to Discharge: Continued Medical Work up ? ? ?Patient Goals and CMS Choice ?  ?  ?  ? ?Expected Discharge Plan and Services ?Expected Discharge Plan: [place] ?  ?  ?  ?Living arrangements for the past 2 months: [place] ?                ?  ?  ?  ?  ?  ?  ?  ?  ?  ?  ? ?Prior Living Arrangements/Services ?Living arrangements for the past 2 months: [place] ?Lives with:: Self ?  ?Do you feel safe going back to the place where you live?: Yes      ?Need for Family Participation in Patient Care: Yes (Comment) ?Care giver support system in place?: Yes (comment) ?  ?Criminal Activity/Legal Involvement Pertinent to Current Situation/Hospitalization: No - Comment as needed ? ?Activities of Daily Living ?Home Assistive Devices/Equipment: None ?ADL Screening (condition at time of admission) ?Patient's cognitive ability adequate to safely complete daily activities?: Yes ?Is the patient deaf or have difficulty hearing?: No ?Does the patient have difficulty seeing, even when wearing glasses/contacts?: No ?Does the patient have difficulty concentrating, remembering, or making decisions?: No ?Patient able to express need for assistance with ADLs?: Yes ?Does  the patient have difficulty dressing or bathing?: No ?Independently performs ADLs?: Yes (appropriate for developmental age) ?Does the patient have difficulty walking or climbing stairs?: No ?Weakness of Legs: Right ?Weakness of Arms/Hands: None ? ?Permission Sought/Granted ?  ?  ?   ?   ?   ?   ? ?Emotional Assessment ?  ?  ?  ?Orientation: : Oriented to Self, Oriented to Place, Oriented to  Time, Oriented to Situation ?Alcohol / Substance Use: Not Applicable ?Psych Involvement: No (comment) ? ?Admission diagnosis:  Pinched nerve [G58.9] ?Back pain [M54.9] ?Patient Active Problem List  ? Diagnosis Date Noted  ? Thrombocytopenia ([place]) 06/13/2021  ? Hypoxia 06/12/2021  ? Back pain 06/12/2021  ? Sciatica of right side associated with disorder of lumbosacral spine 06/12/2021  ? Chronic bilateral low back pain without sciatica 12/14/2018  ? Retinal detachment of right eye with multiple breaks 07/24/2016  ? Esophageal varices without bleeding ([place]) 05/01/2014  ? Alcoholic cirrhosis of liver ([place]) 05/30/2012  ? Gastro-esophageal reflux disease without esophagitis 05/30/2012  ? Hyperlipemia 05/30/2012  ? Hypothyroidism, unspecified 05/30/2012  ? Hypertension, essential, benign 05/30/2012  ? ?PCP:  Sofie Hartigan, MD ?[facility]   [id number] [id number] [id number]?Phone: [phone]: [phone]? ? ? ? ?Social Determinants of Health (SDOH) Interventions ?  ? ?Readmission Risk Interventions ?   ? View :  No data to display.  ?  ?  ?  ? ? ? ?

## 2021-06-15 NOTE — Assessment & Plan Note (Signed)
Pt has severe DDD of lumbar spine. ?Consult Dr. Izora Ribas from neurosurgery.  I had discussed the case with his partner Dr. Lacinda Axon over the weekend recommended Medrol Dosepak ?

## 2021-06-15 NOTE — Assessment & Plan Note (Signed)
Continue Protonix °

## 2021-06-15 NOTE — Assessment & Plan Note (Signed)
Continue Medrol Dosepak. PT and OT recommends SNF.  TOC aware.  Possibly [facility] at discharge ?Negative DVT.  ?We will request neurosurgery evaluation as patient continues to struggle with walking and back pain ?

## 2021-06-15 NOTE — Assessment & Plan Note (Signed)
?    Latest Ref Rng & Units 06/14/2021  ?  5:58 AM 06/13/2021  ?  4:24 AM 06/12/2021  ?  2:46 PM  ?CBC  ?WBC 4.0 - 10.5 K/uL 6.2   3.6   4.6    ?Hemoglobin 13.0 - 17.0 g/dL 13.4   14.2   15.3    ?Hematocrit 39.0 - 52.0 % 39.8   43.4   45.4    ?Platelets 150 - 400 K/uL 64   61   75    ?dvt prophylaxis with heparin/Lovenox held due to low platelets.  ?Scd's. ?Will follow h/h.  ?Iv ppi.  ?Continue nadolol. ? ?

## 2021-06-15 NOTE — Assessment & Plan Note (Signed)
Pt no longer drinks. ?F/u with [facility]  ?Imaging in past showed cholecystitis ?His LFTs are within normal limit and total bilirubin was high but is improving.  It is possible he could have passed gallstone ?

## 2021-06-15 NOTE — Progress Notes (Signed)
?Progress Note ? ? ?Patient: Shaun Weaver KDX:[id number] DOB: October 27, 1960 DOA: 06/12/2021     2 ?DOS: the patient was seen and examined on 06/15/2021 ?  ?Brief hospital course: ?61 year old male with alcoholic liver cirrhosis admitted for uncontrolled back pain and right sided sciatica ? ?5/13: Started Medrol Dosepak, Toradol 4 times daily.  PT, OT eval ?5/14: PT, OT recommends SNF.  TOC aware ?5/15: Right knee x-ray.  SNF search in progress ? ? ? ?Assessment and Plan: ?* Back pain ?Pt has severe DDD of lumbar spine. ?Consult Dr. Izora Ribas from neurosurgery.  I had discussed the case with his partner Dr. Lacinda Axon over the weekend recommended Medrol Dosepak ? ?Sciatica of right side associated with disorder of lumbosacral spine ?Continue Medrol Dosepak. PT and OT recommends SNF.  TOC aware.  Possibly [facility] at discharge ?Negative DVT.  ?We will request neurosurgery evaluation as patient continues to struggle with walking and back pain ? ?Hypoxia ?He is on room air now ?negative dVT. ? ?Alcoholic cirrhosis of liver ([place]) ?Pt no longer drinks. ?F/u with [facility]  ?Imaging in past showed cholecystitis ?His LFTs are within normal limit and total bilirubin was high but is improving.  It is possible he could have passed gallstone ? ?Esophageal varices without bleeding ([place]) ? ?  Latest Ref Rng & Units 06/14/2021  ?  5:58 AM 06/13/2021  ?  4:24 AM 06/12/2021  ?  2:46 PM  ?CBC  ?WBC 4.0 - 10.5 K/uL 6.2   3.6   4.6    ?Hemoglobin 13.0 - 17.0 g/dL 13.4   14.2   15.3    ?Hematocrit 39.0 - 52.0 % 39.8   43.4   45.4    ?Platelets 150 - 400 K/uL 64   61   75    ?dvt prophylaxis with heparin/Lovenox held due to low platelets.  ?Scd's. ?Will follow h/h.  ?Iv ppi.  ?Continue nadolol. ? ? ?Gastro-esophageal reflux disease without esophagitis ?Continue Protonix ? ? ?Hypothyroidism, unspecified ?Continue levothyroxine.  ? ?Hypertension, essential, benign ?Patient is no longer taking nadolol.   continue Coreg  ? ?Thrombocytopenia  ([place]) ?Monitor platelet counts.  This is likely due to alcoholism ? ?96->75-> 61-> 64 ? ?Avoid Lovenox ? ? ?Right knee pain ?While working with therapist - patient is keeping RLE non weight bearing and preferring to hopping around, and cant tolerate being up and moving for more than a minute or two. ? ?We will obtain knee x-ray and consult Ortho ? ? ? ? ?  ? ?Subjective: Still reports pain in the back and some in the right knee hoping to work with therapy ? ?Physical Exam: ?Vitals:  ? 06/15/21 0523 06/15/21 0527 06/15/21 0839 06/15/21 1521  ?BP:  (!) 103/57 123/68 (!) 155/84  ?Pulse:  62 (!) 52 63  ?Resp:  20  18  ?Temp:  97.8 ?F (36.6 ?C) 97.7 ?F (36.5 ?C) 97.7 ?F (36.5 ?C)  ?TempSrc:  Oral  Oral  ?SpO2:  95% 98% 96%  ?Weight: 122.5 kg     ?Height:      ? ?61 year old male in no acute distress ?Lungs clear to auscultation ?Heart regular rate and rhythm ?Abdomen soft, benign ?Musculoskeletal:?Positive straight leg raise on the right side, decreased range of motion of the lumbar spine due to pain ?Neuro alert and oriented, nonfocal ?Skin no rash or lesion ? ?Data Reviewed: ? ?Creatinine 1.35 ? ?Family Communication: None ? ?Disposition: ?Status is: Inpatient ?Remains inpatient appropriate because: Waiting for SNF placement. ? ? Planned  Discharge Destination: Skilled nursing facility ? ? ? DVT prophylaxis-SCDs ?Time spent: 35 minutes ? ?Author: ?Max Sane, MD ?06/15/2021 4:44 PM ? ?For on call review [email or web address].  ?

## 2021-06-15 NOTE — Progress Notes (Signed)
Physical Therapy Treatment ?Patient Details ?Name: Shaun Weaver ?MRN: [id number] ?DOB: 05-26-1960 ?Today's Date: 06/15/2021 ? ? ?History of Present Illness 61 year old male with alcoholic liver cirrhosis admitted for uncontrolled back pain and right sided sciatica. He states his condition started about 2 months ago when he noticed his right foot hurting, but his condition got much worse in the last 2 weeks. He remembers when he got off of his ride mower last Friday his right leg really hurt.  He reports a history of neuropathy in both legs and he got a shot in his back about 15 years ago but he does not remember any episodes of pain down his right leg like he has now. He states he has not seen anyone for his condition except going to the emergency room. He states touching his leg and back aggrevates his condition and laying on the left helped ease it once. He used to love sleeping prone but hasn't been able to for the last 2 months. He tried lying prone and his leg got a lot worse. He states most recently he was crawling around his house to get around and he was using a BSC as a walker. ? ?  ?PT Comments  ? ? Patient alert, agreeable to PT at rest reported 6/10 at rest in his R posterior thigh. The patient did demonstrate needing less physical assist to perform bed mobility and transfers, but is still significantly limited in functional mobility. Sit <> stand with RW and supervision, and pt hopped to recliner in room. Unable to tolerate any further ambulation at this time due to increased pain. With propping in a flexed position in the recliner, he did report some mild improvement in pain. PT and pt discussed discharge options, therapy role, and potential DME. Recommendation for SNF remains appropriate at this time due to functional limitations and changes from PLOF. If pt to discharge home, recommendation of a WC discussed. ? ? ?Patient suffers from significant low back pain with R sided radicular pain which impairs  his/her ability to perform daily activities like toileting, feeding, dressing, grooming, bathing in the home. A cane, walker, crutch will not resolve the patient's issue with performing activities of daily living. A lightweight wheelchair and cushion is required/recommended and will allow patient to safely perform daily activities. ?  ?Patient can safely propel the wheelchair in the home or has a caregiver who can provide assistance.  ?   ?Recommendations for follow up therapy are one component of a multi-disciplinary discharge planning process, led by the attending physician.  Recommendations may be updated based on patient status, additional functional criteria and insurance authorization. ? ?Follow Up Recommendations ? Skilled nursing-short term rehab (<3 hours/day) ?  ?  ?Assistance Recommended at Discharge Intermittent Supervision/Assistance  ?Patient can return home with the following A little help with walking and/or transfers;A little help with bathing/dressing/bathroom;Assistance with cooking/housework;Assist for transportation;Help with stairs or ramp for entrance ?  ?Equipment Recommendations ? BSC/3in1;Rolling walker (2 wheels);Wheelchair cushion (measurements PT);Wheelchair (measurements PT)  ?  ?Recommendations for Other Services   ? ? ?  ?Precautions / Restrictions Precautions ?Precautions: Fall ?Restrictions ?Weight Bearing Restrictions: No  ?  ? ?Mobility ? Bed Mobility ?Overal bed mobility: Needs Assistance ?Bed Mobility: Supine to Sit ?  ?  ?Supine to sit: Supervision ?  ?  ?General bed mobility comments: use of bed rails. pt moving RLE with BUE ?  ? ?Transfers ?Overall transfer level: Needs assistance ?Equipment used: Rolling walker (2 wheels) ?Transfers: Sit  to/from Stand ?Sit to Stand: Supervision ?Stand pivot transfers: Min guard ?  ?  ?  ?  ?General transfer comment: pt chooses to be NWB on RLE, and unable to tolerate upright position >32mn due to increased pain ?  ? ?Ambulation/Gait ?  ?  ?   ?  ?  ?  ?  ?  ? ? ?Stairs ?  ?  ?  ?  ?  ? ? ?Wheelchair Mobility ?  ? ?Modified Rankin (Stroke Patients Only) ?  ? ? ?  ?Balance Overall balance assessment: Needs assistance ?Sitting-balance support: Feet supported ?Sitting balance-Leahy Scale: Good ?  ?  ?Standing balance support: No upper extremity supported, During functional activity, Reliant on assistive device for balance ?Standing balance-Leahy Scale: Poor ?  ?  ?  ?  ?  ?  ?  ?  ?  ?  ?  ?  ?  ? ?  ?Cognition Arousal/Alertness: Awake/alert ?Behavior During Therapy: WW[facility] Inc Psfor tasks assessed/performed ?Overall Cognitive Status: Within Functional Limits for tasks assessed ?  ?  ?  ?  ?  ?  ?  ?  ?  ?  ?  ?  ?  ?  ?  ?  ?  ?  ?  ? ?  ?Exercises   ? ?  ?General Comments   ?  ?  ? ?Pertinent Vitals/Pain Pain Assessment ?Pain Assessment: 0-10 ?Pain Score: 6  ?Pain Location: right glute epicenter radiating to low back and down back of R leg to foot. ?Pain Descriptors / Indicators: Discomfort, Grimacing, Cramping, Constant, Moaning, Crying ?Pain Intervention(s): Limited activity within patient's tolerance, Monitored during session, Repositioned  ? ? ?Home Living   ?  ?  ?  ?  ?  ?  ?  ?  ?  ?   ?  ?Prior Function    ?  ?  ?   ? ?PT Goals (current goals can now be found in the care plan section) Progress towards PT goals: Progressing toward goals ? ?  ?Frequency ? ? ? Min 2X/week ? ? ? ?  ?PT Plan Current plan remains appropriate  ? ? ?Co-evaluation   ?  ?  ?  ?  ? ?  ?AM-PAC PT "6 Clicks" Mobility   ?Outcome Measure ? Help needed turning from your back to your side while in a flat bed without using bedrails?: A Little ?Help needed moving from lying on your back to sitting on the side of a flat bed without using bedrails?: A Little ?Help needed moving to and from a bed to a chair (including a wheelchair)?: A Little ?Help needed standing up from a chair using your arms (e.g., wheelchair or bedside chair)?: A Little ?Help needed to walk in hospital room?: A Lot ?Help  needed climbing 3-5 steps with a railing? : Total ?6 Click Score: 15 ? ?  ?End of Session   ?Activity Tolerance: Patient limited by pain ?Patient left: in chair;with call bell/phone within reach ?Nurse Communication: Mobility status ?PT Visit Diagnosis: Difficulty in walking, not elsewhere classified (R26.2);Pain;Muscle weakness (generalized) (M62.81) ?Pain - Right/Left: Right ?Pain - part of body: Leg (back and RLE) ?  ? ? ?Time: [address]?PT Time Calculation (min) (ACUTE ONLY): 26 min ? ?Charges:  $Therapeutic Activity: 23-37 mins          ?          ? ?DLieutenant DiegoPT, DPT ?12:30 PM,06/15/21 ? ? ?

## 2021-06-15 NOTE — Assessment & Plan Note (Signed)
Monitor platelet counts.  This is likely due to alcoholism ? ?96->75-> 61-> 64 ? ?Avoid Lovenox ? ?

## 2021-06-16 ENCOUNTER — Inpatient Hospital Stay: Payer: BC Managed Care – PPO | Admitting: Radiology

## 2021-06-16 DIAGNOSIS — M5387 Other specified dorsopathies, lumbosacral region: Secondary | ICD-10-CM | POA: Diagnosis not present

## 2021-06-16 DIAGNOSIS — K7030 Alcoholic cirrhosis of liver without ascites: Secondary | ICD-10-CM | POA: Diagnosis not present

## 2021-06-16 DIAGNOSIS — R0902 Hypoxemia: Secondary | ICD-10-CM

## 2021-06-16 DIAGNOSIS — E039 Hypothyroidism, unspecified: Secondary | ICD-10-CM | POA: Diagnosis not present

## 2021-06-16 DIAGNOSIS — D696 Thrombocytopenia, unspecified: Secondary | ICD-10-CM

## 2021-06-16 HISTORY — PX: IR INJECT/THERA/INC NEEDLE/CATH/PLC EPI/LUMB/SAC W/IMG: IMG6130

## 2021-06-16 MED ORDER — LIDOCAINE 5 % EX PTCH
1.0000 | MEDICATED_PATCH | CUTANEOUS | Status: DC
  Administered 2021-06-17 – 2021-06-28 (×12): 1 via TRANSDERMAL
  Filled 2021-06-16 (×13): qty 1

## 2021-06-16 MED ORDER — METHYLPREDNISOLONE ACETATE 80 MG/ML IJ SUSP
INTRAMUSCULAR | Status: AC
  Administered 2021-06-16: 80 mg
  Filled 2021-06-16: qty 1

## 2021-06-16 MED ORDER — AMITRIPTYLINE HCL 25 MG PO TABS
25.0000 mg | ORAL_TABLET | Freq: Every day | ORAL | Status: DC
  Administered 2021-06-16 – 2021-06-27 (×11): 25 mg via ORAL
  Filled 2021-06-16 (×12): qty 1

## 2021-06-16 MED ORDER — LIDOCAINE HCL (PF) 1 % IJ SOLN
INTRAMUSCULAR | Status: AC
  Administered 2021-06-16: 5 mL
  Filled 2021-06-16: qty 30

## 2021-06-16 MED ORDER — IOHEXOL 180 MG/ML  SOLN
4.0000 mL | Freq: Once | INTRAMUSCULAR | Status: AC | PRN
  Administered 2021-06-16: 4 mL via INTRATHECAL

## 2021-06-16 NOTE — TOC Progression Note (Signed)
Transition of Care (TOC) - Progression Note  ? ? ?Patient Details  ?Name: Shaun Weaver ?MRN: [id number] ?Date of Birth: September 30, 1960 ? ?Transition of Care (TOC) CM/SW Contact  ?Beverly Sessions, RN ?Phone Number: ?06/16/2021, 10:19 AM ? ?Clinical Narrative:    ?SNF insurance auth pending  ? ? ?Expected Discharge Plan: [place] ?Barriers to Discharge: Continued Medical Work up ? ?Expected Discharge Plan and Services ?Expected Discharge Plan: [place] ?  ?  ?  ?Living arrangements for the past 2 months: [place] ?                ?  ?  ?  ?  ?  ?  ?  ?  ?  ?  ? ? ?Social Determinants of Health (SDOH) Interventions ?  ? ?Readmission Risk Interventions ?   ? View : No data to display.  ?  ?  ?  ? ? ?

## 2021-06-16 NOTE — Assessment & Plan Note (Signed)
continue Coreg  ?

## 2021-06-16 NOTE — Assessment & Plan Note (Signed)
He is on room air now ?negative dVT. ?

## 2021-06-16 NOTE — Progress Notes (Addendum)
?Progress Note ? ? ?Patient: Shaun Weaver NKN:[id number] DOB: Dec 09, 1960 DOA: 06/12/2021     3 ?DOS: the patient was seen and examined on 06/16/2021 ?  ?Brief hospital course: ?61 year old male with alcoholic liver cirrhosis admitted for uncontrolled back pain and right sided sciatica ? ?5/13: Started Medrol Dosepak, Toradol 4 times daily.  PT, OT eval ?5/14: PT, OT recommends SNF.  TOC aware ?5/15: Right knee x-ray.  SNF search in progress ?5/16: ESI planned under IR ? ? ?Assessment and Plan: ?* Back pain-resolved as of 06/16/2021 ?Pt has severe DDD of lumbar spine. ?Consult Dr. Izora Ribas from neurosurgery.  I had discussed the case with his partner Dr. Lacinda Axon over the weekend recommended Medrol Dosepak ? ?Sciatica of right side associated with disorder of lumbosacral spine ?Continue Medrol Dosepak. PT and OT recommends SNF.  TOC aware.  Possibly [facility] at discharge.  Pending insurance Auth ?Negative DVT.  ? ?Appreciate neurosurgery input.  Patient is getting IR guided spinal injection today ? ?Hypoxia ?He is on room air now ?negative dVT. ? ?Alcoholic cirrhosis of liver ([place]) ?Pt no longer drinks. ?F/u with [facility]  ?Imaging in past showed cholecystitis ?His LFTs are within normal limit and total bilirubin was high but is improving.  It is possible he could have passed gallstone ? ?Esophageal varices without bleeding ([place]) ? ?  Latest Ref Rng & Units 06/14/2021  ?  5:58 AM 06/13/2021  ?  4:24 AM 06/12/2021  ?  2:46 PM  ?CBC  ?WBC 4.0 - 10.5 K/uL 6.2   3.6   4.6    ?Hemoglobin 13.0 - 17.0 g/dL 13.4   14.2   15.3    ?Hematocrit 39.0 - 52.0 % 39.8   43.4   45.4    ?Platelets 150 - 400 K/uL 64   61   75    ?dvt prophylaxis with heparin/Lovenox held due to low platelets.  ?Scd's. ?Will follow h/h.  ?Iv ppi.  ?Continue nadolol. ? ? ?Gastro-esophageal reflux disease without esophagitis ?Continue Protonix ? ? ?Hypothyroidism, unspecified ?Continue levothyroxine.  ? ?Hypertension, essential, benign ? continue Coreg   ? ?Thrombocytopenia ([place]) ?Monitor platelet counts.  This is likely due to alcoholism ? ?96->75-> 61-> 64 ? ?Avoid Lovenox ? ? ?Right knee pain ?Negative x-ray for any acute pathology.  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  This is likely from referred pain from the back.  Appreciate Ortho input ? ? ? ? ?  ? ?Subjective: Still complains of back pain and unable to ambulate much due to pain.  Agreeable for spinal injection ? ?Physical Exam: ?Vitals:  ? 06/15/21 1953 06/16/21 0500 06/16/21 0540 06/16/21 0814  ?BP: 113/87  128/73 120/70  ?Pulse: 60  (!) 58 (!) 53  ?Resp: 18  18   ?Temp: 97.6 ?F (36.4 ?C)  98 ?F (36.7 ?C) 97.8 ?F (36.6 ?C)  ?TempSrc:      ?SpO2: 94%  95% 98%  ?Weight:  121.6 kg    ?Height:      ? ?61 year old male in no acute distress ?Lungs clear to auscultation ?Heart regular rate and rhythm ?Abdomen soft, benign ?Musculoskeletal: Positive straight leg raise on the right side, decreased range of motion of the lumbar spine due to pain ?Neuro alert and oriented, nonfocal ?Skin no rash or lesion ? ?Data Reviewed: ? ?There are no new results to review at this time. ? ?Family Communication: Updated sister Blanch Media over phone ? ?Disposition: ?Status is: Inpatient ?Remains inpatient appropriate because: Getting epidural spinal injection under IR  today followed by SNF placement when insurance Auth arrives ? ? Planned Discharge Destination: Skilled nursing facility ? ? ? DVT prophylaxis-SCDs ?Time spent: 35 minutes ? ?Author: ?Max Sane, MD ?06/16/2021 1:58 PM ? ?For on call review [email or web address].  ?

## 2021-06-16 NOTE — Assessment & Plan Note (Signed)
Continue Medrol Dosepak. PT and OT recommends SNF.  TOC aware.  Possibly [facility] at discharge.  Pending insurance Auth ?Negative DVT.  ? ?Appreciate neurosurgery input.  Patient is getting IR guided spinal injection today ?

## 2021-06-16 NOTE — Assessment & Plan Note (Signed)
Continue Protonix °

## 2021-06-16 NOTE — Progress Notes (Signed)
Physical Therapy Treatment ?Patient Details ?Name: Shaun Weaver ?MRN: [id number] ?DOB: 01-22-1961 ?Today's Date: 06/16/2021 ? ? ?History of Present Illness 61 year old male with alcoholic liver cirrhosis admitted for uncontrolled back pain and right sided sciatica. He states his condition started about 2 months ago when he noticed his right foot hurting, but his condition got much worse in the last 2 weeks. He remembers when he got off of his ride mower last Friday his right leg really hurt.  He reports a history of neuropathy in both legs and he got a shot in his back about 15 years ago but he does not remember any episodes of pain down his right leg like he has now. He states he has not seen anyone for his condition except going to the emergency room. He states touching his leg and back aggrevates his condition and laying on the left helped ease it once. He used to love sleeping prone but hasn't been able to for the last 2 months. He tried lying prone and his leg got a lot worse. He states most recently he was crawling around his house to get around and he was using a BSC as a walker. ? ?  ?PT Comments  ? ? Pt in bed doing HEP on his own.  He is able to transition supine <-> sit with ease and quickly but once sitting or trying to stand/WB pain increases significantly impacting mobility.  He initially wants to try walking around end of bed but he is unable and encouraged to lay back down and get up on the other side near chair.  Stands and transfers with min guard and cues for safety.  Stands a second time for AROM but is unable to walk. ? ?Long discussion about discharge plan.  Pt does have 4 steps with no rails into home.  Stated he has friends who can build a ramp and he is encouraged to start that process to allow for safer in/out of home.  "They can do it in a few hours".  He has a walker and commode at home but would benefit from a wheelchair as he has been using a standard office chair prior to admission.    ? ?SNF is still planned at this time but he is encouraged to address ramp sooner than later if SNF is not available to him.  Wheelchair and HHPT would also be recommended if he does not transition to SNF. ? ? ?Patient suffers from back/leg pain which impairs his/her ability to perform daily activities like toileting, feeding, dressing, grooming, bathing in the home. A cane, walker, crutch will not resolve the patient's issue with performing activities of daily living. A lightweight wheelchair and cushion is required/recommended and will allow patient to safely perform daily activities. ?  ?Patient can safely propel the wheelchair in the home or has a caregiver who can provide assistance.   ?  ?Recommendations for follow up therapy are one component of a multi-disciplinary discharge planning process, led by the attending physician.  Recommendations may be updated based on patient status, additional functional criteria and insurance authorization. ? ?Follow Up Recommendations ? Skilled nursing-short term rehab (<3 hours/day) ?  ?  ?Assistance Recommended at Discharge Intermittent Supervision/Assistance  ?Patient can return home with the following A little help with walking and/or transfers;A little help with bathing/dressing/bathroom;Assistance with cooking/housework;Assist for transportation;Help with stairs or ramp for entrance ?  ?Equipment Recommendations ? BSC/3in1;Rolling walker (2 wheels);Wheelchair cushion (measurements PT);Wheelchair (measurements PT)  ?  ?Recommendations  for Other Services   ? ? ?  ?Precautions / Restrictions Precautions ?Precautions: Fall ?Restrictions ?Weight Bearing Restrictions: No  ?  ? ?Mobility ? Bed Mobility ?Overal bed mobility: Modified Independent ?  ?  ?  ?  ?  ?  ?  ?  ? ?Transfers ?Overall transfer level: Needs assistance ?Equipment used: Rolling walker (2 wheels) ?Transfers: Sit to/from Stand ?Sit to Stand: Min guard ?Stand pivot transfers: Min guard ?  ?  ?  ?  ?General  transfer comment: poor tolerance 2/2 pain, self-elects touchdown WBing only ?  ? ?Ambulation/Gait ?  ?  ?  ?  ?  ?  ?  ?General Gait Details: Patient took a few steps between bed and chair while turning using RW and SBA. He kept his R hip slgihtly flexed and avoided weight bearing on R LE. ? ? ?Stairs ?  ?  ?  ?  ?  ? ? ?Wheelchair Mobility ?  ? ?Modified Rankin (Stroke Patients Only) ?  ? ? ?  ?Balance Overall balance assessment: Needs assistance ?Sitting-balance support: Feet supported ?Sitting balance-Leahy Scale: Good ?  ?  ?Standing balance support: Bilateral upper extremity supported, Reliant on assistive device for balance ?Standing balance-Leahy Scale: Poor ?  ?  ?  ?  ?  ?  ?  ?  ?  ?  ?  ?  ?  ? ?  ?Cognition Arousal/Alertness: Awake/alert ?Behavior During Therapy: [facility] for tasks assessed/performed ?Overall Cognitive Status: Within Functional Limits for tasks assessed ?  ?  ?  ?  ?  ?  ?  ?  ?  ?  ?  ?  ?  ?  ?  ?  ?  ?  ?  ? ?  ?Exercises Other Exercises ?Other Exercises: self selected AROM and stretching which he does regularly ? ?  ?General Comments   ?  ?  ? ?Pertinent Vitals/Pain Pain Assessment ?Pain Assessment: 0-10 ?Pain Score: 8  ?Pain Location: right glute epicenter radiating to low back and down back of R leg to foot. ?Pain Descriptors / Indicators: Discomfort, Grimacing, Cramping, Constant, Moaning, Crying ?Pain Intervention(s): Limited activity within patient's tolerance, Monitored during session, Repositioned  ? ? ?Home Living   ?  ?  ?  ?  ?  ?  ?  ?  ?  ?   ?  ?Prior Function    ?  ?  ?   ? ?PT Goals (current goals can now be found in the care plan section) Progress towards PT goals: Progressing toward goals ? ?  ?Frequency ? ? ? Min 2X/week ? ? ? ?  ?PT Plan Current plan remains appropriate  ? ? ?Co-evaluation   ?  ?  ?  ?  ? ?  ?AM-PAC PT "6 Clicks" Mobility   ?Outcome Measure ? Help needed turning from your back to your side while in a flat bed without using bedrails?: A Little ?Help  needed moving from lying on your back to sitting on the side of a flat bed without using bedrails?: A Little ?Help needed moving to and from a bed to a chair (including a wheelchair)?: A Little ?Help needed standing up from a chair using your arms (e.g., wheelchair or bedside chair)?: A Little ?Help needed to walk in hospital room?: A Lot ?Help needed climbing 3-5 steps with a railing? : Total ?6 Click Score: 15 ? ?  ?End of Session   ?Activity Tolerance: Patient limited by pain ?Patient left:  in chair;with call bell/phone within reach ?Nurse Communication: Mobility status ?PT Visit Diagnosis: Difficulty in walking, not elsewhere classified (R26.2);Pain;Muscle weakness (generalized) (M62.81) ?Pain - Right/Left: Right ?Pain - part of body: Leg (back and RLE) ?  ? ? ?Time: 1779-3903 ?PT Time Calculation (min) (ACUTE ONLY): 23 min ? ?Charges:  $Therapeutic Exercise: 8-22 mins ?$Therapeutic Activity: 8-22 mins          ?         Chesley Noon, PTA ?06/16/21, 12:04 PM ? ?

## 2021-06-16 NOTE — Assessment & Plan Note (Signed)
Monitor platelet counts.  This is likely due to alcoholism ? ?96->75-> 61-> 64 ? ?Avoid Lovenox ? ?

## 2021-06-16 NOTE — Assessment & Plan Note (Signed)
Continue levothyroxine 

## 2021-06-16 NOTE — Assessment & Plan Note (Signed)
?    Latest Ref Rng & Units 06/14/2021  ?  5:58 AM 06/13/2021  ?  4:24 AM 06/12/2021  ?  2:46 PM  ?CBC  ?WBC 4.0 - 10.5 K/uL 6.2   3.6   4.6    ?Hemoglobin 13.0 - 17.0 g/dL 13.4   14.2   15.3    ?Hematocrit 39.0 - 52.0 % 39.8   43.4   45.4    ?Platelets 150 - 400 K/uL 64   61   75    ?dvt prophylaxis with heparin/Lovenox held due to low platelets.  ?Scd's. ?Will follow h/h.  ?Iv ppi.  ?Continue nadolol. ? ?

## 2021-06-16 NOTE — Assessment & Plan Note (Signed)
Negative x-ray for any acute pathology.  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  This is likely from referred pain from the back.  Appreciate Ortho input ?

## 2021-06-16 NOTE — Assessment & Plan Note (Signed)
Pt no longer drinks. ?F/u with [facility]  ?Imaging in past showed cholecystitis ?His LFTs are within normal limit and total bilirubin was high but is improving.  It is possible he could have passed gallstone ?

## 2021-06-17 DIAGNOSIS — M5440 Lumbago with sciatica, unspecified side: Secondary | ICD-10-CM | POA: Diagnosis not present

## 2021-06-17 LAB — CBC
HCT: 40.2 % (ref 39.0–52.0)
Hemoglobin: 13.5 g/dL (ref 13.0–17.0)
MCH: 31.3 pg (ref 26.0–34.0)
MCHC: 33.6 g/dL (ref 30.0–36.0)
MCV: 93.1 fL (ref 80.0–100.0)
Platelets: 48 10*3/uL — ABNORMAL LOW (ref 150–400)
RBC: 4.32 MIL/uL (ref 4.22–5.81)
RDW: 14.8 % (ref 11.5–15.5)
WBC: 3.1 10*3/uL — ABNORMAL LOW (ref 4.0–10.5)
nRBC: 0 % (ref 0.0–0.2)

## 2021-06-17 LAB — BASIC METABOLIC PANEL
Anion gap: 7 (ref 5–15)
BUN: 24 mg/dL — ABNORMAL HIGH (ref 8–23)
CO2: 26 mmol/L (ref 22–32)
Calcium: 8.8 mg/dL — ABNORMAL LOW (ref 8.9–10.3)
Chloride: 104 mmol/L (ref 98–111)
Creatinine, Ser: 1.18 mg/dL (ref 0.61–1.24)
GFR, Estimated: >60 mL/min (ref >60)
Glucose, Bld: 130 mg/dL — ABNORMAL HIGH (ref 70–99)
Potassium: 4.5 mmol/L (ref 3.5–5.1)
Sodium: 137 mmol/L (ref 135–145)

## 2021-06-17 MED ORDER — PANTOPRAZOLE SODIUM 40 MG PO TBEC
40.0000 mg | DELAYED_RELEASE_TABLET | Freq: Every day | ORAL | Status: DC
Start: 2021-06-18 — End: 2021-06-28
  Administered 2021-06-18 – 2021-06-28 (×11): 40 mg via ORAL
  Filled 2021-06-17 (×11): qty 1

## 2021-06-17 MED ORDER — GABAPENTIN 400 MG PO CAPS
800.0000 mg | ORAL_CAPSULE | Freq: Three times a day (TID) | ORAL | Status: DC
  Administered 2021-06-17 – 2021-06-18 (×3): 800 mg via ORAL
  Filled 2021-06-17 (×3): qty 2

## 2021-06-17 NOTE — Progress Notes (Signed)
Physical Therapy Treatment ?Patient Details ?Name: Shaun Weaver ?MRN: [id number] ?DOB: May 25, 1960 ?Today's Date: 06/17/2021 ? ? ?History of Present Illness 61 year old male with alcoholic liver cirrhosis admitted for uncontrolled back pain and right sided sciatica. He states his condition started about 2 months ago when he noticed his right foot hurting, but his condition got much worse in the last 2 weeks. He remembers when he got off of his ride mower last Friday his right leg really hurt.  He reports a history of neuropathy in both legs and he got a shot in his back about 15 years ago but he does not remember any episodes of pain down his right leg like he has now. He states he has not seen anyone for his condition except going to the emergency room. He states touching his leg and back aggrevates his condition and laying on the left helped ease it once. He used to love sleeping prone but hasn't been able to for the last 2 months. He tried lying prone and his leg got a lot worse. He states most recently he was crawling around his house to get around and he was using a BSC as a walker. ? ?  ?PT Comments  ? ? Patient alert, agreeable to PT with encouragement did report 10/10 pain in R buttock. He was able to perform bed mobility modI and extended time. Sit <> Stand with supervision though pt demonstrated some decreased safety due to reliance on RW. He was able to hop (pt preferred TTWB on RLE due to pain) a few feet to the recliner in the room, but was unable to ambulate any further due to elevated pain. Extended time to assist patient with repositioning for comfort as able. The patient would benefit from further skilled PT intervention to continue to progress towards goals. Recommendation remains appropriate due to inability to perform functional mobility.   ?   ?Recommendations for follow up therapy are one component of a multi-disciplinary discharge planning process, led by the attending physician.  Recommendations  may be updated based on patient status, additional functional criteria and insurance authorization. ? ?Follow Up Recommendations ? Skilled nursing-short term rehab (<3 hours/day) ?  ?  ?Assistance Recommended at Discharge Intermittent Supervision/Assistance  ?Patient can return home with the following A little help with walking and/or transfers;A little help with bathing/dressing/bathroom;Assistance with cooking/housework;Assist for transportation;Help with stairs or ramp for entrance ?  ?Equipment Recommendations ? BSC/3in1;Rolling walker (2 wheels);Wheelchair cushion (measurements PT);Wheelchair (measurements PT)  ?  ?Recommendations for Other Services   ? ? ?  ?Precautions / Restrictions Precautions ?Precautions: Fall ?Restrictions ?Weight Bearing Restrictions: No  ?  ? ?Mobility ? Bed Mobility ?Overal bed mobility: Modified Independent ?  ?  ?  ?  ?  ?  ?  ?  ? ?Transfers ?Overall transfer level: Needs assistance ?Equipment used: Rolling walker (2 wheels) ?Transfers: Sit to/from Stand ?Sit to Stand: Supervision ?  ?  ?  ?  ?  ?General transfer comment: poor tolerance 2/2 pain, self-elects touchdown WBing only ?  ? ?Ambulation/Gait ?  ?Gait Distance (Feet): 2 Feet ?  ?  ?  ?  ?  ?General Gait Details: unable to tolerate true household distance due to significantly elevated pain ? ? ?Stairs ?  ?  ?  ?  ?  ? ? ?Wheelchair Mobility ?  ? ?Modified Rankin (Stroke Patients Only) ?  ? ? ?  ?Balance Overall balance assessment: Needs assistance ?Sitting-balance support: Feet supported ?Sitting balance-Leahy Scale:  Good ?  ?  ?Standing balance support: Bilateral upper extremity supported, Reliant on assistive device for balance ?Standing balance-Leahy Scale: Poor ?Standing balance comment: requires B UE support on RW to maintain balance while standing (avoids weight bearing on R LE). ?  ?  ?  ?  ?  ?  ?  ?  ?  ?  ?  ?  ? ?  ?Cognition Arousal/Alertness: Awake/alert ?Behavior During Therapy: [facility] for tasks  assessed/performed ?Overall Cognitive Status: Within Functional Limits for tasks assessed ?  ?  ?  ?  ?  ?  ?  ?  ?  ?  ?  ?  ?  ?  ?  ?  ?  ?  ?  ? ?  ?Exercises   ? ?  ?General Comments   ?  ?  ? ?Pertinent Vitals/Pain Pain Assessment ?Pain Assessment: 0-10 ?Pain Score: 10-Worst pain ever ?Pain Location: right glute epicenter radiating to low back and down back of R glute ?Pain Descriptors / Indicators: Discomfort, Grimacing, Cramping, Constant, Moaning, Crying ?Pain Intervention(s): Limited activity within patient's tolerance, Monitored during session, Repositioned  ? ? ?Home Living   ?  ?  ?  ?  ?  ?  ?  ?  ?  ?   ?  ?Prior Function    ?  ?  ?   ? ?PT Goals (current goals can now be found in the care plan section) Progress towards PT goals: Progressing toward goals ? ?  ?Frequency ? ? ? Min 2X/week ? ? ? ?  ?PT Plan Current plan remains appropriate  ? ? ?Co-evaluation   ?  ?  ?  ?  ? ?  ?AM-PAC PT "6 Clicks" Mobility   ?Outcome Measure ? Help needed turning from your back to your side while in a flat bed without using bedrails?: A Little ?Help needed moving from lying on your back to sitting on the side of a flat bed without using bedrails?: A Little ?Help needed moving to and from a bed to a chair (including a wheelchair)?: A Little ?Help needed standing up from a chair using your arms (e.g., wheelchair or bedside chair)?: A Little ?Help needed to walk in hospital room?: A Lot ?Help needed climbing 3-5 steps with a railing? : Total ?6 Click Score: 15 ? ?  ?End of Session   ?Activity Tolerance: Patient limited by pain ?Patient left: in chair;with call bell/phone within reach ?Nurse Communication: Mobility status ?PT Visit Diagnosis: Difficulty in walking, not elsewhere classified (R26.2);Pain;Muscle weakness (generalized) (M62.81) ?Pain - Right/Left: Right ?Pain - part of body: Leg (back and RLE) ?  ? ? ?Time: 1884-1660 ?PT Time Calculation (min) (ACUTE ONLY): 11 min ? ?Charges:  $Therapeutic Activity: 8-22  mins          ?          ? ?Lieutenant Diego PT, DPT ?3:23 PM,06/17/21 ? ? ?

## 2021-06-17 NOTE — Progress Notes (Signed)
?PROGRESS NOTE ? ? ? ?Shaun Weaver  ELF:[id number] DOB: 05-14-1960 DOA: 06/12/2021 ?PCP: Sofie Hartigan, MD  ? ? ?Brief Narrative:  ?61 year old male with alcoholic liver cirrhosis admitted for uncontrolled back pain and right sided sciatica ?  ?5/13: Started Medrol Dosepak, Toradol 4 times daily.  PT, OT eval ?5/14: PT, OT recommends SNF.  TOC aware ?5/15: Right knee x-ray.  SNF search in progress ?5/16: ESI planned under IR ?5/17: Status post epidural spinal injection.  Back pain overall improved, but not yet back to baseline ? ?Assessment & Plan: ?  ?Active Problems: ?  Sciatica of right side associated with disorder of lumbosacral spine ?  Hypoxia ?  Alcoholic cirrhosis of liver ([place]) ?  Esophageal varices without bleeding ([place]) ?  Gastro-esophageal reflux disease without esophagitis ?  Hypothyroidism, unspecified ?  Hypertension, essential, benign ?  Thrombocytopenia ([place]) ?  Right knee pain ? ?* Back pain ?Status post [facility] 5/16 ?Initially back pain improved, now slightly worse on 5/17 ?No neurosurgical intervention warranted ?On Medrol Dosepak per neurosurgery recommendations ?Plan: ?Multimodal pain regimen ?Continue Medrol taper ?Therapy as tolerated ?Need skilled nursing facility ?Anticipate medical readiness for discharge 5/18 ?  ?Sciatica of right side associated with disorder of lumbosacral spine ?As above ?  ?Hypoxia, resolved ?He is on room air now ?negative dVT. ?  ?Alcoholic cirrhosis of liver ([place]) ?Pt no longer drinks. ?F/u with [facility]  ?Imaging in past showed cholecystitis ?His LFTs are within normal limit and total bilirubin was high but is improving.   ?It is possible he could have passed gallstone ?  ?Esophageal varices without bleeding ([place]) ?dvt prophylaxis with heparin/Lovenox held due to low platelets.  ?Scd's. ?P.o. PPI ?  ?  ?Gastro-esophageal reflux disease without esophagitis ?Continue Protonix ?  ?  ?Hypothyroidism, unspecified ?Continue levothyroxine.  ?  ?Hypertension, essential,  benign ? continue Coreg  ?  ?Thrombocytopenia ([place]) ?Monitor platelet counts.  This is likely due to alcoholism ?Avoid Lovenox ?  ?  ?Right knee pain ?Negative x-ray for any acute pathology.  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   ?This is likely from referred pain from the back.  Appreciate Ortho input ?No surgical intervention warranted ? ? ?DVT prophylaxis: SCD ?Code Status: Full ?Family Communication: None today ?Disposition Plan: Status is: Inpatient ?Remains inpatient appropriate because: Acute low back pain.  Anticipate medical readiness for discharge 5/18 ? ? ?Level of care: Med-Surg ? ?Consultants:  ?Neurosurgery ? ?Procedures:  ?Epidural spinal injection 5/16 ? ?Antimicrobials: ?None ? ? ?Subjective: ?Seen and examined.  Resting comfortably in bed.  Some distress due to low back pain.  Patient states pain was initially improved on 5/16 and has subsequently worsened today.  Improved overall ? ?Objective: ?Vitals:  ? 06/16/21 2032 06/17/21 0455 06/17/21 0500 06/17/21 0806  ?BP: 126/72 (!) 108/57  (!) 149/75  ?Pulse: 61 (!) 55  (!) 56  ?Resp: '18 18  17  '$ ?Temp: 98 ?F (36.7 ?C) 97.9 ?F (36.6 ?C)  97.9 ?F (36.6 ?C)  ?TempSrc:      ?SpO2: 97% 94%  95%  ?Weight:   119.4 kg   ?Height:      ? ? ?Intake/Output Summary (Last 24 hours) at 06/17/2021 1250 ?Last data filed at 06/17/2021 1022 ?Gross per 24 hour  ?Intake 240 ml  ?Output 800 ml  ?Net -560 ml  ? ?Filed Weights  ? 06/15/21 0523 06/16/21 0500 06/17/21 0500  ?Weight: 122.5 kg 121.6 kg 119.4 kg  ? ? ?Examination: ? ?General exam: No  acute distress ?Respiratory system: Clear to auscultation. Respiratory effort normal. ?Cardiovascular system: S1-S2, RRR, no murmurs, no pedal edema ?Gastrointestinal system: Soft, NT/ND, normal bowel sounds ?Central nervous system: Alert and oriented. No focal neurological deficits. ?Extremities: Moderately decreased power bilateral lower extremities ?Skin: No rashes, lesions or ulcers ?Psychiatry: Judgement and insight appear normal. Mood & affect  appropriate.  ? ? ? ?Data Reviewed: I have personally reviewed following labs and imaging studies ? ?CBC: ?Recent Labs  ?Lab 06/12/21 ?1446 06/13/21 ?0424 06/14/21 ?9735 06/17/21 ?0606  ?WBC 4.6 3.6* 6.2 3.1*  ?NEUTROABS 3.0  --   --   --   ?HGB 15.3 14.2 13.4 13.5  ?HCT 45.4 43.4 39.8 40.2  ?MCV 91.2 93.1 90.9 93.1  ?PLT 75* 61* 64* 48*  ? ?Basic Metabolic Panel: ?Recent Labs  ?Lab 06/12/21 ?1446 06/12/21 ?2215 06/13/21 ?0424 06/13/21 ?1353 06/14/21 ?3299 06/15/21 ?2426 06/17/21 ?0606  ?NA 138   < > 134* 133* 134* 135 137  ?K 3.9   < > 5.6* 4.3 5.1 5.1 4.5  ?CL 107   < > 106 102 107 105 104  ?CO2 24   < > '23 23 24 24 26  '$ ?GLUCOSE 90   < > 140* 89 140* 131* 130*  ?BUN 24*   < > 27* 29* 33* 35* 24*  ?CREATININE 1.52*   < > 1.33* 1.47* 1.46* 1.35* 1.18  ?CALCIUM 9.1   < > 8.9 9.0 9.0 9.0 8.8*  ?MG 2.1  --   --   --   --   --   --   ? < > = values in this interval not displayed.  ? ?GFR: ?Estimated Creatinine Clearance: 86.6 mL/min (by C-G formula based on SCr of 1.18 mg/dL). ?Liver Function Tests: ?Recent Labs  ?Lab 06/12/21 ?1446 06/13/21 ?0424 06/14/21 ?8341  ?AST 33 26 23  ?ALT '24 23 21  '$ ?ALKPHOS 77 69 61  ?BILITOT 2.5* 1.7* 0.8  ?PROT 6.8 6.5 5.6*  ?ALBUMIN 3.5 3.2* 2.9*  ? ?Recent Labs  ?Lab 06/12/21 ?1446  ?LIPASE 44  ? ?No results for input(s): AMMONIA in the last 168 hours. ?Coagulation Profile: ?No results for input(s): INR, PROTIME in the last 168 hours. ?Cardiac Enzymes: ?No results for input(s): CKTOTAL, CKMB, CKMBINDEX, TROPONINI in the last 168 hours. ?BNP (last 3 results) ?No results for input(s): PROBNP in the last 8760 hours. ?HbA1C: ?No results for input(s): HGBA1C in the last 72 hours. ?CBG: ?No results for input(s): GLUCAP in the last 168 hours. ?Lipid Profile: ?No results for input(s): CHOL, HDL, LDLCALC, TRIG, CHOLHDL, LDLDIRECT in the last 72 hours. ?Thyroid Function Tests: ?No results for input(s): TSH, T4TOTAL, FREET4, T3FREE, THYROIDAB in the last 72 hours. ?Anemia Panel: ?No results for  input(s): VITAMINB12, FOLATE, FERRITIN, TIBC, IRON, RETICCTPCT in the last 72 hours. ?Sepsis Labs: ?No results for input(s): PROCALCITON, LATICACIDVEN in the last 168 hours. ? ?No results found for this or any previous visit (from the past 240 hour(s)).  ? ? ? ? ? ?Radiology Studies: ?DG Knee Complete 4 Views Right ? ?Result Date: 06/15/2021 ?CLINICAL DATA:  Pain EXAM: RIGHT KNEE - COMPLETE 4 VIEW COMPARISON:  None Available. FINDINGS: No recent fracture or dislocation is seen. There is no effusion. Degenerative changes are noted with bony spurs in medial, lateral and patellofemoral compartments. IMPRESSION: No recent fracture or dislocation is seen. There is no significant effusion. Moderate degenerative changes are noted. Electronically Signed   By: Elmer Picker M.D.   On: 06/15/2021 17:19  ? ?  IR INJECT DIAG/THERA/INC NEEDLE/CATH/PLC EPI/LUMB/SAC W/IMG ? ?Result Date: 06/16/2021 ?CLINICAL DATA:  60 year old male with lumbosacral spondylosis without myelopathy. He has severe low back pain and right L5 and S1 radiculopathy. He presents for epidural steroid injection. FLUOROSCOPY: Radiation Exposure Index (as provided by the fluoroscopic device): 14.1 mGy Kerma PROCEDURE: The procedure, risks, benefits, and alternatives were explained to the patient. Questions regarding the procedure were encouraged and answered. The patient understands and consents to the procedure. LUMBAR EPIDURAL INJECTION: An interlaminar approach was performed on right at L5-S1. The overlying skin was cleansed and anesthetized. A 20 gauge epidural needle was advanced using loss-of-resistance technique. DIAGNOSTIC EPIDURAL INJECTION: Injection of Isovue-M 200 shows a good epidural pattern with spread above and below the level of needle placement, primarily on the right no vascular opacification is seen. THERAPEUTIC EPIDURAL INJECTION: Eighty mg of Depo-Medrol mixed with 2 mL 1% lidocaine were instilled. The procedure was well-tolerated, and  the patient was discharged thirty minutes following the injection in good condition. COMPLICATIONS: None. IMPRESSION: Technically successful epidural injection on the right L5-S1 #1. Electronically Signed   By:

## 2021-06-18 DIAGNOSIS — M5440 Lumbago with sciatica, unspecified side: Secondary | ICD-10-CM | POA: Diagnosis not present

## 2021-06-18 MED ORDER — METHOCARBAMOL 500 MG PO TABS
500.0000 mg | ORAL_TABLET | Freq: Three times a day (TID) | ORAL | Status: DC
  Administered 2021-06-18 – 2021-06-20 (×7): 500 mg via ORAL
  Filled 2021-06-18 (×7): qty 1

## 2021-06-18 MED ORDER — DIAZEPAM 2 MG PO TABS
2.0000 mg | ORAL_TABLET | Freq: Three times a day (TID) | ORAL | Status: DC
  Administered 2021-06-18 – 2021-06-19 (×2): 2 mg via ORAL
  Filled 2021-06-18 (×2): qty 1

## 2021-06-18 MED ORDER — GABAPENTIN 300 MG PO CAPS
900.0000 mg | ORAL_CAPSULE | Freq: Three times a day (TID) | ORAL | Status: DC
  Administered 2021-06-18 – 2021-06-22 (×12): 900 mg via ORAL
  Filled 2021-06-18 (×12): qty 3

## 2021-06-18 MED ORDER — KETOROLAC TROMETHAMINE 15 MG/ML IJ SOLN
15.0000 mg | Freq: Four times a day (QID) | INTRAMUSCULAR | Status: DC
  Administered 2021-06-18 – 2021-06-19 (×5): 15 mg via INTRAVENOUS
  Filled 2021-06-18 (×5): qty 1

## 2021-06-18 NOTE — Progress Notes (Signed)
Physical Therapy Treatment Patient Details Name: Shaun Weaver MRN: [id number] DOB: 1960/09/23 Today's Date: 06/18/2021   History of Present Illness 61 year old male with alcoholic liver cirrhosis admitted for uncontrolled back pain and right sided sciatica. He states his condition started about 2 months ago when he noticed his right foot hurting, but his condition got much worse in the last 2 weeks. He remembers when he got off of his ride mower last Friday his right leg really hurt.  He reports a history of neuropathy in both legs and he got a shot in his back about 15 years ago but he does not remember any episodes of pain down his right leg like he has now. He states he has not seen anyone for his condition except going to the emergency room. He states touching his leg and back aggrevates his condition and laying on the left helped ease it once. He used to love sleeping prone but hasn't been able to for the last 2 months. He tried lying prone and his leg got a lot worse. He states most recently he was crawling around his house to get around and he was using a BSC as a walker.    PT Comments    Pt is pleasnt and motivated but impulsive who agreed to participate in therapy. Pt has severe pain with movment, standing and ambulation. Pt participated in bedmobility with Sup and performed transfers including STS and Bed->Recliner with Min to CGA of 1 with FWW. Ambulated in room with FWW with TTWB to RLE 10 ft x 1. Pt is limited with pain. Pt demonstrates neurogenic pain with ambulation. Pt performed modified sciatica glides and ankle pumps for pain relief. Pt advised to perform these exs every hour for pain relief. Pt with 9/10 after session but resting comfortably in recliner. Pt will benefit from SNF to return to  [facility] and safe discharge home alone.   Recommendations for follow up therapy are one component of a multi-disciplinary discharge planning process, led by the attending physician.  Recommendations  may be updated based on patient status, additional functional criteria and insurance authorization.  Follow Up Recommendations  Skilled nursing-short term rehab (<3 hours/day)     Assistance Recommended at Discharge Intermittent Supervision/Assistance  Patient can return home with the following A little help with walking and/or transfers;A little help with bathing/dressing/bathroom;Assistance with cooking/housework;Assist for transportation;Help with stairs or ramp for entrance   Equipment Recommendations  BSC/3in1;Rolling walker (2 wheels);Wheelchair cushion (measurements PT);Wheelchair (measurements PT)    Recommendations for Other Services       Precautions / Restrictions Precautions Precautions: Fall Restrictions Weight Bearing Restrictions: No     Mobility  Bed Mobility Overal bed mobility: Modified Independent Bed Mobility: Supine to Sit Rolling: Supervision   Supine to sit: Supervision          Transfers Overall transfer level: Needs assistance Equipment used: Rolling walker (2 wheels) Transfers: Sit to/from Stand, Bed to chair/wheelchair/BSC Sit to Stand: Min guard Stand pivot transfers: Min guard Step pivot transfers: Min assist            Ambulation/Gait Ambulation/Gait assistance: Min guard Gait Distance (Feet): 10 Feet Assistive device: Rolling walker (2 wheels) Gait Pattern/deviations: Step-to pattern, Decreased step length - right, Decreased step length - left, Shuffle, Antalgic Gait velocity: extremely slow     General Gait Details:  (TTWB due to severe pain with WT. Unable to extend RLE fully 2/2 pain.)   Stairs  Wheelchair Mobility    Modified Rankin (Stroke Patients Only)       Balance Overall balance assessment: Needs assistance Sitting-balance support: Feet supported Sitting balance-Leahy Scale: Good   Postural control: Left lateral lean (foward) Standing balance support: Bilateral upper extremity supported,  Reliant on assistive device for balance Standing balance-Leahy Scale: Fair Standing balance comment: requires B UE support on RW to maintain balance while standing (avoids weight bearing on R LE).                            Cognition Arousal/Alertness: Awake/alert Behavior During Therapy: WFL for tasks assessed/performed Overall Cognitive Status: Within Functional Limits for tasks assessed                                 General Comments: decreased safety awareness        Exercises General Exercises - Lower Extremity Ankle Circles/Pumps: AROM, 20 reps Other Exercises Other Exercises: Sciatica glides and dural stretch.    General Comments        Pertinent Vitals/Pain Pain Assessment Pain Assessment: 0-10 Pain Score: 10-Worst pain ever Breathing: normal Negative Vocalization: none Pain Location: right glute epicenter radiating to low back and down back of R glute Pain Descriptors / Indicators: Discomfort, Grimacing, Cramping, Constant, Moaning, Crying, Sharp, Shooting, Tightness, Tingling Pain Intervention(s): Patient requesting pain meds-RN notified (neural stretch exs.)    Home Living                          Prior Function            PT Goals (current goals can now be found in the care plan section) Acute Rehab PT Goals Patient Stated Goal: to improve pain so he can function PT Goal Formulation: With patient Time For Goal Achievement: 06/27/21 Potential to Achieve Goals: Good Progress towards PT goals: Progressing toward goals    Frequency    Min 2X/week      PT Plan Current plan remains appropriate    Co-evaluation              AM-PAC PT "6 Clicks" Mobility   Outcome Measure  Help needed turning from your back to your side while in a flat bed without using bedrails?: A Little Help needed moving from lying on your back to sitting on the side of a flat bed without using bedrails?: A Little Help needed moving  to and from a bed to a chair (including a wheelchair)?: A Little Help needed standing up from a chair using your arms (e.g., wheelchair or bedside chair)?: A Little Help needed to walk in hospital room?: A Lot Help needed climbing 3-5 steps with a railing? : Total 6 Click Score: 15    End of Session Equipment Utilized During Treatment: Gait belt Activity Tolerance: Patient limited by pain Patient left: in chair;with call bell/phone within reach   PT Visit Diagnosis: Difficulty in walking, not elsewhere classified (R26.2);Pain;Muscle weakness (generalized) (M62.81) Pain - Right/Left: Right Pain - part of body: Leg (low back)     Time: 0017-4944 PT Time Calculation (min) (ACUTE ONLY): 24 min  Charges:  $Gait Training: 8-22 mins $Therapeutic Activity: 8-22 mins                    Maury Groninger PT DPT 4:53 PM,06/18/21    Kuper Rennels H Nisha Dhami  06/18/2021, 4:38 PM

## 2021-06-18 NOTE — TOC Progression Note (Signed)
Transition of Care [facility]) - Progression Note    Patient Details  Name: Shaun Weaver MRN: [id number] Date of Birth: 07/29/1960  [facility] CM/SW [place], LCSW Phone Number: 06/18/2021, 10:51 AM  Clinical Narrative:  Insurance authorization approved. Per MD, pain uncontrolled so will plan for tomorrow if improved. SNF admissions coordinator is aware.  Expected Discharge Plan: [place] Barriers to Discharge: Continued Medical Work up  Expected Discharge Plan and Services Expected Discharge Plan: [place] arrangements for the past 2 months: Single Family Home                                       Social Determinants of Health (SDOH) Interventions    Readmission Risk Interventions     View : No data to display.

## 2021-06-18 NOTE — Progress Notes (Signed)
PROGRESS NOTE    Shaun Weaver  QQV:[id number] DOB: 10/18/1960 DOA: 06/12/2021 PCP: Sofie Hartigan, MD    Brief Narrative:  61 year old male with alcoholic liver cirrhosis admitted for uncontrolled back pain and right sided sciatica   5/13: Started Medrol Dosepak, Toradol 4 times daily.  PT, OT eval 5/14: PT, OT recommends SNF.  TOC aware 5/15: Right knee x-ray.  SNF search in progress 5/16: ESI planned under IR 5/17: Status post epidural spinal injection.  Back pain overall improved, but not yet back to baseline 5/18: Back pain persistent.  Most severe in the buttock and radiating down the leg  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   Hypoxia   Alcoholic cirrhosis of liver ([facility])   Esophageal varices without bleeding ([facility])   Gastro-esophageal reflux disease without esophagitis   Hypothyroidism, unspecified   Hypertension, essential, benign   Thrombocytopenia ([facility])   Right knee pain  * Back pain Status post [facility] 5/16 Initially back pain improved, now slightly worse on 5/18 No neurosurgical intervention warranted On Medrol Dosepak per neurosurgery recommendations Plan: Multimodal pain regimen Gabapentin, Robaxin, Toradol, Lidoderm Continue Medrol taper Therapy as tolerated Need skilled nursing facility Anticipate medical readiness for discharge 5/19   Sciatica of right side associated with disorder of lumbosacral spine As above   Hypoxia, resolved He is on room air now negative dVT.   Alcoholic cirrhosis of liver ([facility]) Pt no longer drinks. F/u with [facility]  Imaging in past showed cholecystitis His LFTs are within normal limit and total bilirubin was high but is improving.   It is possible he could have passed gallstone   Esophageal varices without bleeding ([facility]) dvt prophylaxis with heparin/Lovenox held due to low platelets.  Scd's. P.o. PPI     Gastro-esophageal reflux disease without  esophagitis Continue Protonix     Hypothyroidism, unspecified Continue levothyroxine.    Hypertension, essential, benign  continue Coreg    Thrombocytopenia ([facility]) Monitor platelet counts.  This is likely due to alcoholism Avoid Lovenox     Right knee pain Negative x-ray for any acute pathology.  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   This is likely from referred pain from the back.  Appreciate Ortho input No surgical intervention warranted   DVT prophylaxis: SCD Code Status: Full Family Communication: None today Disposition Plan: Status is: Inpatient Remains inpatient appropriate because: Acute low back pain.  Anticipate medical readiness for discharge 5/19   Level of care: Med-Surg  Consultants:  Neurosurgery  Procedures:  Epidural spinal injection 5/16  Antimicrobials: None   Subjective: Patient seen and examined.  Uncomfortable appearing.  Overall in good spirits.  Does endorse significant lower back pain.  Objective: Vitals:   06/17/21 1640 06/17/21 1959 06/17/21 2000 06/18/21 0232  BP: (!) 148/91 (!) 117/106 (!) 163/83 138/60  Pulse: 68 (!) 57 (!) 57 (!) 50  Resp: '16 20 20 20  '$ Temp: 98.2 F (36.8 C) (!) 97.5 F (36.4 C) (!) 97.5 F (36.4 C) 97.7 F (36.5 C)  TempSrc:  Oral  Oral  SpO2: 99% 100% 98% 95%  Weight:      Height:        Intake/Output Summary (Last 24 hours) at 06/18/2021 1232 Last data filed at 06/18/2021 1048 Gross per 24 hour  Intake 360 ml  Output 700 ml  Net -340 ml   Filed Weights   06/15/21 0523 06/16/21 0500 06/17/21 0500  Weight: 122.5 kg 121.6  kg 119.4 kg    Examination:  General exam: Mild distress due to pain Respiratory system: Clear to auscultation. Respiratory effort normal. Cardiovascular system: S1-S2, RRR, no murmurs, no pedal edema Gastrointestinal system: Soft, NT/ND, normal bowel sounds Central nervous system: Alert and oriented. No focal neurological deficits. Extremities: Moderately decreased power bilateral lower  extremities.  Decreased range of motion Skin: No rashes, lesions or ulcers Psychiatry: Judgement and insight appear normal. Mood & affect appropriate.     Data Reviewed: I have personally reviewed following labs and imaging studies  CBC: Recent Labs  Lab 06/12/21 1446 06/13/21 0424 06/14/21 0558 06/17/21 0606  WBC 4.6 3.6* 6.2 3.1*  NEUTROABS 3.0  --   --   --   HGB 15.3 14.2 13.4 13.5  HCT 45.4 43.4 39.8 40.2  MCV 91.2 93.1 90.9 93.1  PLT 75* 61* 64* 48*   Basic Metabolic Panel: Recent Labs  Lab 06/12/21 1446 06/12/21 2215 06/13/21 0424 06/13/21 1353 06/14/21 0558 06/15/21 0552 06/17/21 0606  NA 138   < > 134* 133* 134* 135 137  K 3.9   < > 5.6* 4.3 5.1 5.1 4.5  CL 107   < > 106 102 107 105 104  CO2 24   < > '23 23 24 24 26  '$ GLUCOSE 90   < > 140* 89 140* 131* 130*  BUN 24*   < > 27* 29* 33* 35* 24*  CREATININE 1.52*   < > 1.33* 1.47* 1.46* 1.35* 1.18  CALCIUM 9.1   < > 8.9 9.0 9.0 9.0 8.8*  MG 2.1  --   --   --   --   --   --    < > = values in this interval not displayed.   GFR: Estimated Creatinine Clearance: 86.6 mL/min (by C-G formula based on SCr of 1.18 mg/dL). Liver Function Tests: Recent Labs  Lab 06/12/21 1446 06/13/21 0424 06/14/21 0558  AST 33 26 23  ALT '24 23 21  '$ ALKPHOS 77 69 61  BILITOT 2.5* 1.7* 0.8  PROT 6.8 6.5 5.6*  ALBUMIN 3.5 3.2* 2.9*   Recent Labs  Lab 06/12/21 1446  LIPASE 44   No results for input(s): AMMONIA in the last 168 hours. Coagulation Profile: No results for input(s): INR, PROTIME in the last 168 hours. Cardiac Enzymes: No results for input(s): CKTOTAL, CKMB, CKMBINDEX, TROPONINI in the last 168 hours. BNP (last 3 results) No results for input(s): PROBNP in the last 8760 hours. HbA1C: No results for input(s): HGBA1C in the last 72 hours. CBG: No results for input(s): GLUCAP in the last 168 hours. Lipid Profile: No results for input(s): CHOL, HDL, LDLCALC, TRIG, CHOLHDL, LDLDIRECT in the last 72 hours. Thyroid  Function Tests: No results for input(s): TSH, T4TOTAL, FREET4, T3FREE, THYROIDAB in the last 72 hours. Anemia Panel: No results for input(s): VITAMINB12, FOLATE, FERRITIN, TIBC, IRON, RETICCTPCT in the last 72 hours. Sepsis Labs: No results for input(s): PROCALCITON, LATICACIDVEN in the last 168 hours.  No results found for this or any previous visit (from the past 240 hour(s)).       Radiology Studies: IR INJECT DIAG/THERA/INC NEEDLE/CATH/PLC EPI/LUMB/SAC W/IMG  Result Date: 06/16/2021 CLINICAL DATA:  61 year old male with lumbosacral spondylosis without myelopathy. He has severe low back pain and right L5 and S1 radiculopathy. He presents for epidural steroid injection. FLUOROSCOPY: Radiation Exposure Index (as provided by the fluoroscopic device): 14.1 mGy Kerma PROCEDURE: The procedure, risks, benefits, and alternatives were explained to the patient. Questions regarding  the procedure were encouraged and answered. The patient understands and consents to the procedure. LUMBAR EPIDURAL INJECTION: An interlaminar approach was performed on right at L5-S1. The overlying skin was cleansed and anesthetized. A 20 gauge epidural needle was advanced using loss-of-resistance technique. DIAGNOSTIC EPIDURAL INJECTION: Injection of Isovue-M 200 shows a good epidural pattern with spread above and below the level of needle placement, primarily on the right no vascular opacification is seen. THERAPEUTIC EPIDURAL INJECTION: Eighty mg of Depo-Medrol mixed with 2 mL 1% lidocaine were instilled. The procedure was well-tolerated, and the patient was discharged thirty minutes following the injection in good condition. COMPLICATIONS: None. IMPRESSION: Technically successful epidural injection on the right L5-S1 #1. Electronically Signed   By: Jacqulynn Cadet M.D.   On: 06/16/2021 14:12        Scheduled Meds:  amitriptyline  25 mg Oral QHS   carvedilol  6.25 mg Oral BID WC   fluticasone  2 spray Each Nare Daily    gabapentin  900 mg Oral TID   ketorolac  15 mg Intravenous Q6H   levothyroxine  100 mcg Oral QAC breakfast   lidocaine  1 patch Transdermal Q24H   methocarbamol  500 mg Oral TID   pantoprazole  40 mg Oral Daily   sodium chloride flush  3 mL Intravenous Q12H   thiamine  100 mg Oral Daily   Continuous Infusions:   LOS: 5 days       Sidney Ace, MD [facility]   If 7PM-7AM, please contact night-coverage  06/18/2021, 12:32 PM

## 2021-06-19 DIAGNOSIS — M5440 Lumbago with sciatica, unspecified side: Secondary | ICD-10-CM | POA: Diagnosis not present

## 2021-06-19 LAB — BASIC METABOLIC PANEL
Anion gap: 5 (ref 5–15)
BUN: 32 mg/dL — ABNORMAL HIGH (ref 8–23)
CO2: 27 mmol/L (ref 22–32)
Calcium: 8.9 mg/dL (ref 8.9–10.3)
Chloride: 104 mmol/L (ref 98–111)
Creatinine, Ser: 1.44 mg/dL — ABNORMAL HIGH (ref 0.61–1.24)
GFR, Estimated: 55 mL/min — ABNORMAL LOW (ref >60)
Glucose, Bld: 158 mg/dL — ABNORMAL HIGH (ref 70–99)
Potassium: 4 mmol/L (ref 3.5–5.1)
Sodium: 136 mmol/L (ref 135–145)

## 2021-06-19 LAB — CBC WITH DIFFERENTIAL/PLATELET
Abs Immature Granulocytes: 0.09 10*3/uL — ABNORMAL HIGH (ref 0.00–0.07)
Basophils Absolute: 0 10*3/uL (ref 0.0–0.1)
Basophils Relative: 0 %
Eosinophils Absolute: 0.1 10*3/uL (ref 0.0–0.5)
Eosinophils Relative: 1 %
HCT: 45.5 % (ref 39.0–52.0)
Hemoglobin: 14.9 g/dL (ref 13.0–17.0)
Immature Granulocytes: 1 %
Lymphocytes Relative: 10 %
Lymphs Abs: 0.7 10*3/uL (ref 0.7–4.0)
MCH: 31.1 pg (ref 26.0–34.0)
MCHC: 32.7 g/dL (ref 30.0–36.0)
MCV: 95 fL (ref 80.0–100.0)
Monocytes Absolute: 0.4 10*3/uL (ref 0.1–1.0)
Monocytes Relative: 5 %
Neutro Abs: 5.7 10*3/uL (ref 1.7–7.7)
Neutrophils Relative %: 83 %
Platelets: 62 10*3/uL — ABNORMAL LOW (ref 150–400)
RBC: 4.79 MIL/uL (ref 4.22–5.81)
RDW: 15.2 % (ref 11.5–15.5)
WBC: 6.9 10*3/uL (ref 4.0–10.5)
nRBC: 0 % (ref 0.0–0.2)

## 2021-06-19 LAB — PROTIME-INR
INR: 1.3 — ABNORMAL HIGH (ref 0.8–1.2)
Prothrombin Time: 15.7 seconds — ABNORMAL HIGH (ref 11.4–15.2)

## 2021-06-19 MED ORDER — DIAZEPAM 2 MG PO TABS
3.0000 mg | ORAL_TABLET | Freq: Once | ORAL | Status: AC
  Administered 2021-06-19: 3 mg via ORAL
  Filled 2021-06-19: qty 2

## 2021-06-19 MED ORDER — DIAZEPAM 5 MG PO TABS
5.0000 mg | ORAL_TABLET | Freq: Two times a day (BID) | ORAL | Status: DC
  Administered 2021-06-19 – 2021-06-20 (×2): 5 mg via ORAL
  Filled 2021-06-19 (×2): qty 1

## 2021-06-19 NOTE — TOC Progression Note (Signed)
Transition of Care [facility]) - Progression Note    Patient Details  Name: Shaun Weaver MRN: [id number] Date of Birth: October 17, 1960  [facility]) CM/SW Contact  Candie Chroman, LCSW Phone Number: 06/19/2021, 1:00 PM  Clinical Narrative:  SNF can accept patient tomorrow if stable.   Expected Discharge Plan: [place] Barriers to Discharge: Continued Medical Work up  Expected Discharge Plan and Services Expected Discharge Plan: [place] arrangements for the past 2 months: Single Family Home                                       Social Determinants of Health (SDOH) Interventions    Readmission Risk Interventions     View : No data to display.

## 2021-06-19 NOTE — Progress Notes (Signed)
PROGRESS NOTE    Shaun Weaver  CXK:[id number] DOB: Apr 09, 1960 DOA: 06/12/2021 PCP: Sofie Hartigan, MD    Brief Narrative:  61 year old male with alcoholic liver cirrhosis admitted for uncontrolled back pain and right sided sciatica   5/13: Started Medrol Dosepak, Toradol 4 times daily.  PT, OT eval 5/14: PT, OT recommends SNF.  TOC aware 5/15: Right knee x-ray.  SNF search in progress 5/16: ESI planned under IR 5/17: Status post epidural spinal injection.  Back pain overall improved, but not yet back to baseline 5/18: Back pain persistent.  Most severe in the buttock and radiating down the leg 5/19.  Persistent back pain.  Not a surgical candidate per neurosurgery  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   Hypoxia   Alcoholic cirrhosis of liver (HCC)   Esophageal varices without bleeding (HCC)   Gastro-esophageal reflux disease without esophagitis   Hypothyroidism, unspecified   Hypertension, essential, benign   Thrombocytopenia (HCC)   Right knee pain  * Back pain Status post [facility] 5/16 Initially back pain improved, now slightly worse on 5/18 No neurosurgical intervention warranted On Medrol Dosepak per neurosurgery recommendations Plan: Multimodal pain regimen Gabapentin, Robaxin, Toradol, Lidoderm Add Valium 5 mg twice daily Continue Medrol taper Therapy as tolerated Need skilled nursing facility Tentative plan medically ready over the weekend for discharge to skilled nursing facility   Sciatica of right side associated with disorder of lumbosacral spine As above   Hypoxia, resolved He is on room air now negative dVT.   Alcoholic cirrhosis of liver (HCC) Pt no longer drinks. F/u with [facility]  Imaging in past showed cholecystitis His LFTs are within normal limit and total bilirubin was high but is improving.   It is possible he could have passed gallstone Unfortunately presence of cirrhosis  thrombocytopenia and elevated INR makes him a poor surgical candidate   Esophageal varices without bleeding (HCC) dvt prophylaxis with heparin/Lovenox held due to low platelets.  Scd's. P.o. PPI     Gastro-esophageal reflux disease without esophagitis Continue Protonix     Hypothyroidism, unspecified Continue levothyroxine.    Hypertension, essential, benign  continue Coreg    Thrombocytopenia (HCC) Monitor platelet counts.  This is likely due to alcoholism Avoid Lovenox     Right knee pain Negative x-ray for any acute pathology.  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   This is likely from referred pain from the back.  Appreciate Ortho input No surgical intervention warranted   DVT prophylaxis: SCD Code Status: Full Family Communication: None today Disposition Plan: Status is: Inpatient Remains inpatient appropriate because: Acute low back pain.  Slow to recover.  Hopeful to have medically ready over the weekend   Level of care: Med-Surg  Consultants:  Neurosurgery  Procedures:  Epidural spinal injection 5/16  Antimicrobials: None   Subjective: Patient seen and examined.  Appears more comfortable today.  Still endorsing back pain radiating down to the legs  Objective: Vitals:   06/19/21 0407 06/19/21 0500 06/19/21 0838 06/19/21 0917  BP: 128/74  (!) 150/83   Pulse: (!) 58  (!) 56 78  Resp: 18  16   Temp: 97.8 F (36.6 C)  97.8 F (36.6 C)   TempSrc:   Oral   SpO2: 97%  97%   Weight:  119.2 kg    Height:        Intake/Output Summary (Last 24 hours) at 06/19/2021 1131 Last data filed at  06/19/2021 0900 Gross per 24 hour  Intake 840 ml  Output 700 ml  Net 140 ml   Filed Weights   06/16/21 0500 06/17/21 0500 06/19/21 0500  Weight: 121.6 kg 119.4 kg 119.2 kg    Examination:  General exam: Mild distress due to pain Respiratory system: Lungs clear.  Normal work of breathing.  Room air Cardiovascular system: S1-S2, RRR, no murmurs, no pedal  edema Gastrointestinal system: Soft, NT/ND, normal bowel sounds Central nervous system: Alert and oriented. No focal neurological deficits. Extremities: Moderately decreased power bilateral lower extremities.  Decreased range of motion Skin: No rashes, lesions or ulcers Psychiatry: Judgement and insight appear normal. Mood & affect appropriate.     Data Reviewed: I have personally reviewed following labs and imaging studies  CBC: Recent Labs  Lab 06/12/21 1446 06/13/21 0424 06/14/21 0558 06/17/21 0606 06/19/21 0941  WBC 4.6 3.6* 6.2 3.1* 6.9  NEUTROABS 3.0  --   --   --  5.7  HGB 15.3 14.2 13.4 13.5 14.9  HCT 45.4 43.4 39.8 40.2 45.5  MCV 91.2 93.1 90.9 93.1 95.0  PLT 75* 61* 64* 48* 62*   Basic Metabolic Panel: Recent Labs  Lab 06/12/21 1446 06/12/21 2215 06/13/21 1353 06/14/21 0558 06/15/21 0552 06/17/21 0606 06/19/21 0941  NA 138   < > 133* 134* 135 137 136  K 3.9   < > 4.3 5.1 5.1 4.5 4.0  CL 107   < > 102 107 105 104 104  CO2 24   < > '23 24 24 26 27  '$ GLUCOSE 90   < > 89 140* 131* 130* 158*  BUN 24*   < > 29* 33* 35* 24* 32*  CREATININE 1.52*   < > 1.47* 1.46* 1.35* 1.18 1.44*  CALCIUM 9.1   < > 9.0 9.0 9.0 8.8* 8.9  MG 2.1  --   --   --   --   --   --    < > = values in this interval not displayed.   GFR: Estimated Creatinine Clearance: 70.9 mL/min (A) (by C-G formula based on SCr of 1.44 mg/dL (H)). Liver Function Tests: Recent Labs  Lab 06/12/21 1446 06/13/21 0424 06/14/21 0558  AST 33 26 23  ALT '24 23 21  '$ ALKPHOS 77 69 61  BILITOT 2.5* 1.7* 0.8  PROT 6.8 6.5 5.6*  ALBUMIN 3.5 3.2* 2.9*   Recent Labs  Lab 06/12/21 1446  LIPASE 44   No results for input(s): AMMONIA in the last 168 hours. Coagulation Profile: Recent Labs  Lab 06/19/21 0941  INR 1.3*   Cardiac Enzymes: No results for input(s): CKTOTAL, CKMB, CKMBINDEX, TROPONINI in the last 168 hours. BNP (last 3 results) No results for input(s): PROBNP in the last 8760  hours. HbA1C: No results for input(s): HGBA1C in the last 72 hours. CBG: No results for input(s): GLUCAP in the last 168 hours. Lipid Profile: No results for input(s): CHOL, HDL, LDLCALC, TRIG, CHOLHDL, LDLDIRECT in the last 72 hours. Thyroid Function Tests: No results for input(s): TSH, T4TOTAL, FREET4, T3FREE, THYROIDAB in the last 72 hours. Anemia Panel: No results for input(s): VITAMINB12, FOLATE, FERRITIN, TIBC, IRON, RETICCTPCT in the last 72 hours. Sepsis Labs: No results for input(s): PROCALCITON, LATICACIDVEN in the last 168 hours.  No results found for this or any previous visit (from the past 240 hour(s)).       Radiology Studies: No results found.      Scheduled Meds:  amitriptyline  25 mg Oral  QHS   carvedilol  6.25 mg Oral BID WC   diazepam  5 mg Oral BID   fluticasone  2 spray Each Nare Daily   gabapentin  900 mg Oral TID   ketorolac  15 mg Intravenous Q6H   levothyroxine  100 mcg Oral QAC breakfast   lidocaine  1 patch Transdermal Q24H   methocarbamol  500 mg Oral TID   pantoprazole  40 mg Oral Daily   sodium chloride flush  3 mL Intravenous Q12H   thiamine  100 mg Oral Daily   Continuous Infusions:   LOS: 6 days       Sidney Ace, MD [facility]   If 7PM-7AM, please contact night-coverage  06/19/2021, 11:31 AM

## 2021-06-19 NOTE — Progress Notes (Signed)
Occupational Therapy Treatment Patient Details Name: Shaun Weaver MRN: [id number] DOB: 1960-06-20 Today's Date: 06/19/2021   History of present illness 61 year old male with alcoholic liver cirrhosis admitted for uncontrolled back pain and right sided sciatica   OT comments  Pt seen for OT treatment on this date. Upon arrival to room pt awake and alert. Pt seated upright in bed. Pt reported 10/10 pain w/ movement. Pt agreeable to tx. Pt currently requires BUE support standing, SETUP for seated grooming, and CGA + RW sit<>stand x2 and ~5 ft mobility - limited by RLE pain. Pt verbalized understanding and return demonstrated exercises provided by PT. Pt making good progress toward goals. Will continue to follow POC. Discharge recommendation remains appropriate.     Recommendations for follow up therapy are one component of a multi-disciplinary discharge planning process, led by the attending physician.  Recommendations may be updated based on patient status, additional functional criteria and insurance authorization.    Follow Up Recommendations  Skilled nursing-short term rehab (<3 hours/day)    Assistance Recommended at Discharge Intermittent Supervision/Assistance  Patient can return home with the following  A lot of help with walking and/or transfers;A lot of help with bathing/dressing/bathroom;Help with stairs or ramp for entrance   Equipment Recommendations  BSC/3in1       Precautions / Restrictions Precautions Precautions: Fall Restrictions Weight Bearing Restrictions: No       Mobility Bed Mobility Overal bed mobility: Modified Independent                  Transfers Overall transfer level: Needs assistance Equipment used: Rolling walker (2 wheels) Transfers: Sit to/from Stand, Bed to chair/wheelchair/BSC Sit to Stand: Min guard     Step pivot transfers: Min guard     General transfer comment: 5 ft for x2 trials     Balance Overall balance assessment: Needs  assistance Sitting-balance support: Feet supported Sitting balance-Leahy Scale: Good     Standing balance support: Bilateral upper extremity supported, Reliant on assistive device for balance Standing balance-Leahy Scale: Fair                             ADL either performed or assessed with clinical judgement   ADL Overall ADL's : Needs assistance/impaired                                       General ADL Comments: Requires BUE support standing, unable to progress to standing grooming tasks, SETUP for seated grooming. CGA + RW simulated BSC t/f.      Cognition Arousal/Alertness: Awake/alert Behavior During Therapy: WFL for tasks assessed/performed Overall Cognitive Status: Within Functional Limits for tasks assessed                                                     Pertinent Vitals/ Pain       Pain Assessment Pain Assessment: 0-10 Pain Score: 10-Worst pain ever Pain Location: right glute and hamstring Pain Descriptors / Indicators: Discomfort, Grimacing Pain Intervention(s): Repositioned, Limited activity within patient's tolerance   Frequency  Min 2X/week        Progress Toward Goals  OT Goals(current goals can now be found in the care plan section)  Progress towards OT goals: Progressing toward goals  Acute Rehab OT Goals Patient Stated Goal: to improve pain OT Goal Formulation: With patient Time For Goal Achievement: 06/27/21 Potential to Achieve Goals: Good ADL Goals Pt Will Perform Grooming: with modified independence;standing Pt Will Perform Lower Body Dressing: with modified independence;with caregiver independent in assisting;sit to/from stand Pt Will Transfer to Toilet: with modified independence;ambulating;regular height toilet  Plan Discharge plan remains appropriate;Frequency remains appropriate       AM-PAC OT "6 Clicks" Daily Activity     Outcome Measure   Help from another person eating  meals?: None Help from another person taking care of personal grooming?: A Little Help from another person toileting, which includes using toliet, bedpan, or urinal?: A Little Help from another person bathing (including washing, rinsing, drying)?: A Lot Help from another person to put on and taking off regular upper body clothing?: A Little Help from another person to put on and taking off regular lower body clothing?: A Lot 6 Click Score: 17    End of Session Equipment Utilized During Treatment: Rolling walker (2 wheels)  OT Visit Diagnosis: Other abnormalities of gait and mobility (R26.89);Muscle weakness (generalized) (M62.81)   Activity Tolerance Patient tolerated treatment well   Patient Left in chair;with call bell/phone within reach   Nurse Communication          Time: 5361-4431 OT Time Calculation (min): 15 min  Charges: OT General Charges $OT Visit: 1 Visit OT Treatments $Therapeutic Exercise: 8-22 mins  [employer], OTDS  [place] Florabelle Cardin 06/19/2021, 3:04 PM

## 2021-06-20 DIAGNOSIS — M5440 Lumbago with sciatica, unspecified side: Secondary | ICD-10-CM | POA: Diagnosis not present

## 2021-06-20 MED ORDER — METHOCARBAMOL 500 MG PO TABS
750.0000 mg | ORAL_TABLET | Freq: Four times a day (QID) | ORAL | Status: DC
  Administered 2021-06-20 (×3): 750 mg via ORAL
  Filled 2021-06-20 (×3): qty 2

## 2021-06-20 MED ORDER — DIAZEPAM 5 MG PO TABS
10.0000 mg | ORAL_TABLET | Freq: Two times a day (BID) | ORAL | Status: DC
  Administered 2021-06-20 – 2021-06-21 (×3): 10 mg via ORAL
  Filled 2021-06-20 (×4): qty 2

## 2021-06-20 NOTE — Plan of Care (Signed)

## 2021-06-20 NOTE — Progress Notes (Signed)
PROGRESS NOTE    Shaun Weaver  WIO:[id number] DOB: 05-Mar-1960 DOA: 06/12/2021 PCP: Sofie Hartigan, MD    Brief Narrative:  61 year old male with alcoholic liver cirrhosis admitted for uncontrolled back pain and right sided sciatica   5/13: Started Medrol Dosepak, Toradol 4 times daily.  PT, OT eval 5/14: PT, OT recommends SNF.  TOC aware 5/15: Right knee x-ray.  SNF search in progress 5/16: ESI planned under IR 5/17: Status post epidural spinal injection.  Back pain overall improved, but not yet back to baseline 5/18: Back pain persistent.  Most severe in the buttock and radiating down the leg 5/19.  Persistent back pain.  Not a surgical candidate per neurosurgery  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   Hypoxia   Alcoholic cirrhosis of liver (HCC)   Esophageal varices without bleeding (HCC)   Gastro-esophageal reflux disease without esophagitis   Hypothyroidism, unspecified   Hypertension, essential, benign   Thrombocytopenia (HCC)   Right knee pain  * Back pain Status post [facility] 5/16 Initially back pain improved, now slightly worse on 5/18 No neurosurgical intervention warranted On Medrol Dosepak per neurosurgery recommendations Plan: Multimodal pain regimen Gabapentin 900 3 times daily Robaxin-750 4 times daily Valium 10 mg twice daily Lidoderm patch Continue Medrol taper Continue therapy as tolerated Plan for discharge to skilled nursing facility once pain is better controlled   Sciatica of right side associated with disorder of lumbosacral spine As above   Hypoxia, resolved He is on room air now negative dVT.   Alcoholic cirrhosis of liver (HCC) Pt no longer drinks. F/u with [facility]  Imaging in past showed cholecystitis His LFTs are within normal limit and total bilirubin was high but is improving.   It is possible he could have passed gallstone He is not coagulopathic thrombocytopenia  makes him a dangerous surgical candidate   Esophageal varices without bleeding (HCC) dvt prophylaxis with heparin/Lovenox held due to low platelets.  Scd's. P.o. PPI     Gastro-esophageal reflux disease without esophagitis Continue Protonix     Hypothyroidism, unspecified Continue levothyroxine.    Hypertension, essential, benign  continue Coreg    Thrombocytopenia (HCC) Monitor platelet counts.  This is likely due to alcoholism Avoid Lovenox     Right knee pain Negative x-ray for any acute pathology.  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   This is likely from referred pain from the back.  Appreciate Ortho input No surgical intervention warranted   DVT prophylaxis: SCD Code Status: Full Family Communication: None today Disposition Plan: Status is: Inpatient Remains inpatient appropriate because: Acute low back pain.  Slow to recover.  Discharge to skilled nursing facility pending improvement in pain control  Level of care: Med-Surg  Consultants:  Neurosurgery  Procedures:  Epidural spinal injection 5/16  Antimicrobials: None   Subjective: Patient seen and examined.  Endorses persistent severe pain in bilateral lower extremities and low back  Objective: Vitals:   06/19/21 2018 06/20/21 0404 06/20/21 0600 06/20/21 0735  BP: (!) 163/89 (!) 153/86  (!) 137/96  Pulse: 73 62  73  Resp: '18 18  20  '$ Temp: 98 F (36.7 C) 97.7 F (36.5 C)  97.6 F (36.4 C)  TempSrc:    Oral  SpO2: 99% 98%  97%  Weight:   122.2 kg   Height:        Intake/Output Summary (Last 24 hours) at 06/20/2021 1142 Last data filed at  06/20/2021 1012 Gross per 24 hour  Intake 240 ml  Output 700 ml  Net -460 ml   Filed Weights   06/17/21 0500 06/19/21 0500 06/20/21 0600  Weight: 119.4 kg 119.2 kg 122.2 kg    Examination:  General exam: NAD Respiratory system: Lungs clear.  Normal work of breathing.  Room air Cardiovascular system: S1-S2, RRR, no murmurs, no pedal edema Gastrointestinal  system: Soft, NT/ND, normal bowel sounds Central nervous system: Alert and oriented. No focal neurological deficits. Extremities: Decreased power BLE  skin: No rashes, lesions or ulcers Psychiatry: Judgement and insight appear normal. Mood & affect appropriate.     Data Reviewed: I have personally reviewed following labs and imaging studies  CBC: Recent Labs  Lab 06/14/21 0558 06/17/21 0606 06/19/21 0941  WBC 6.2 3.1* 6.9  NEUTROABS  --   --  5.7  HGB 13.4 13.5 14.9  HCT 39.8 40.2 45.5  MCV 90.9 93.1 95.0  PLT 64* 48* 62*   Basic Metabolic Panel: Recent Labs  Lab 06/13/21 1353 06/14/21 0558 06/15/21 0552 06/17/21 0606 06/19/21 0941  NA 133* 134* 135 137 136  K 4.3 5.1 5.1 4.5 4.0  CL 102 107 105 104 104  CO2 '23 24 24 26 27  '$ GLUCOSE 89 140* 131* 130* 158*  BUN 29* 33* 35* 24* 32*  CREATININE 1.47* 1.46* 1.35* 1.18 1.44*  CALCIUM 9.0 9.0 9.0 8.8* 8.9   GFR: Estimated Creatinine Clearance: 71.8 mL/min (A) (by C-G formula based on SCr of 1.44 mg/dL (H)). Liver Function Tests: Recent Labs  Lab 06/14/21 0558  AST 23  ALT 21  ALKPHOS 61  BILITOT 0.8  PROT 5.6*  ALBUMIN 2.9*   No results for input(s): LIPASE, AMYLASE in the last 168 hours.  No results for input(s): AMMONIA in the last 168 hours. Coagulation Profile: Recent Labs  Lab 06/19/21 0941  INR 1.3*   Cardiac Enzymes: No results for input(s): CKTOTAL, CKMB, CKMBINDEX, TROPONINI in the last 168 hours. BNP (last 3 results) No results for input(s): PROBNP in the last 8760 hours. HbA1C: No results for input(s): HGBA1C in the last 72 hours. CBG: No results for input(s): GLUCAP in the last 168 hours. Lipid Profile: No results for input(s): CHOL, HDL, LDLCALC, TRIG, CHOLHDL, LDLDIRECT in the last 72 hours. Thyroid Function Tests: No results for input(s): TSH, T4TOTAL, FREET4, T3FREE, THYROIDAB in the last 72 hours. Anemia Panel: No results for input(s): VITAMINB12, FOLATE, FERRITIN, TIBC, IRON,  RETICCTPCT in the last 72 hours. Sepsis Labs: No results for input(s): PROCALCITON, LATICACIDVEN in the last 168 hours.  No results found for this or any previous visit (from the past 240 hour(s)).       Radiology Studies: No results found.      Scheduled Meds:  amitriptyline  25 mg Oral QHS   carvedilol  6.25 mg Oral BID WC   diazepam  10 mg Oral BID   fluticasone  2 spray Each Nare Daily   gabapentin  900 mg Oral TID   levothyroxine  100 mcg Oral QAC breakfast   lidocaine  1 patch Transdermal Q24H   methocarbamol  750 mg Oral QID   pantoprazole  40 mg Oral Daily   sodium chloride flush  3 mL Intravenous Q12H   thiamine  100 mg Oral Daily   Continuous Infusions:   LOS: 7 days       Sidney Ace, MD [facility]   If 7PM-7AM, please contact night-coverage  06/20/2021, 11:42 AM

## 2021-06-20 NOTE — Progress Notes (Signed)
Physical Therapy Treatment Patient Details Name: Shaun Weaver MRN: [id number] DOB: 1960-12-25 Today's Date: 06/20/2021   History of Present Illness 61 year old male with alcoholic liver cirrhosis admitted for uncontrolled back pain and right sided sciatica    PT Comments    Pt alert agreeable to PT with encouragement, reported 10/10 pain with any and all movement today. Session focused on promoting relaxation and education on techniques. Such as deep breathing, muscle relaxation during breathing exercises. Pt lay flat in supine (instructed to lay flat on bed 2-3x a day) to promote neutral positioning of spine. LTR x10, R&L piriformis stretch x30 bilaterally, attempted QL stretch pt unable to tolerate. x30sec traction of R LLE without changes in symptoms. Supine to sit modI, sit <> stand with CGA and RW (PT did stabilize RW), and pt attempted to ambulate. Pt quickly returned to sitting in chair at bedside citing too much pain to walk today. The patient continues to  be limited towards goals due to ongoing elevated pain. He would benefit from further skilled PT intervention to maximize function, decrease pain, and further education, recommendation remains appropriate due to functional limitations.    Recommendations for follow up therapy are one component of a multi-disciplinary discharge planning process, led by the attending physician.  Recommendations may be updated based on patient status, additional functional criteria and insurance authorization.  Follow Up Recommendations  Skilled nursing-short term rehab (<3 hours/day)     Assistance Recommended at Discharge Intermittent Supervision/Assistance  Patient can return home with the following A little help with walking and/or transfers;A little help with bathing/dressing/bathroom;Assistance with cooking/housework;Assist for transportation;Help with stairs or ramp for entrance   Equipment Recommendations  BSC/3in1;Rolling walker (2  wheels);Wheelchair cushion (measurements PT);Wheelchair (measurements PT)    Recommendations for Other Services       Precautions / Restrictions Precautions Precautions: Fall Restrictions Weight Bearing Restrictions: No     Mobility  Bed Mobility Overal bed mobility: Modified Independent                  Transfers Overall transfer level: Needs assistance Equipment used: Rolling walker (2 wheels) Transfers: Sit to/from Stand, Bed to chair/wheelchair/BSC Sit to Stand: Min guard Stand pivot transfers: Min guard Step pivot transfers: Min guard            Ambulation/Gait               General Gait Details: pt unable to ambulate today, reported too much pain and quickly returns to sitting   Stairs             Wheelchair Mobility    Modified Rankin (Stroke Patients Only)       Balance Overall balance assessment: Needs assistance Sitting-balance support: Feet supported Sitting balance-Leahy Scale: Good     Standing balance support: Bilateral upper extremity supported, Reliant on assistive device for balance Standing balance-Leahy Scale: Fair Standing balance comment: requires B UE support on RW to maintain balance while standing (avoids weight bearing on R LE).                            Cognition Arousal/Alertness: Awake/alert Behavior During Therapy: WFL for tasks assessed/performed Overall Cognitive Status: Within Functional Limits for tasks assessed                                 General Comments: decreased safety awareness  Exercises Other Exercises Other Exercises: educated extensively on relaxation techniques; deep breathing, muscle relaxation. educated and instructed to lay flat on bed 2-3x a day to promote neutral positioning of spine. LTR x10, R&L piriformis stretch x30 bilaterally, attempted QL stretch pt unable to tolerate. x30sec traction of R LLE without changes in symptoms    General  Comments        Pertinent Vitals/Pain Pain Assessment Pain Assessment: 0-10 Pain Score: 10-Worst pain ever Pain Location: right glute and hamstring Pain Descriptors / Indicators: Discomfort, Grimacing Pain Intervention(s): Repositioned, Limited activity within patient's tolerance, Monitored during session, Utilized relaxation techniques    Home Living                          Prior Function            PT Goals (current goals can now be found in the care plan section) Progress towards PT goals: Progressing toward goals (minimally, very limited by pain)    Frequency    Min 2X/week      PT Plan Current plan remains appropriate    Co-evaluation              AM-PAC PT "6 Clicks" Mobility   Outcome Measure  Help needed turning from your back to your side while in a flat bed without using bedrails?: A Little Help needed moving from lying on your back to sitting on the side of a flat bed without using bedrails?: A Little Help needed moving to and from a bed to a chair (including a wheelchair)?: A Little Help needed standing up from a chair using your arms (e.g., wheelchair or bedside chair)?: A Little Help needed to walk in hospital room?: A Lot Help needed climbing 3-5 steps with a railing? : Total 6 Click Score: 15    End of Session   Activity Tolerance: Patient limited by pain Patient left: in chair;with call bell/phone within reach   PT Visit Diagnosis: Difficulty in walking, not elsewhere classified (R26.2);Pain;Muscle weakness (generalized) (M62.81) Pain - part of body: Leg (low back)     Time: 9767-3419 PT Time Calculation (min) (ACUTE ONLY): 21 min  Charges:  $Therapeutic Exercise: 8-22 mins                     Lieutenant Diego PT, DPT 9:19 AM,06/20/21

## 2021-06-21 DIAGNOSIS — M5440 Lumbago with sciatica, unspecified side: Secondary | ICD-10-CM | POA: Diagnosis not present

## 2021-06-21 LAB — BASIC METABOLIC PANEL
Anion gap: 3 — ABNORMAL LOW (ref 5–15)
BUN: 19 mg/dL (ref 8–23)
CO2: 28 mmol/L (ref 22–32)
Calcium: 8.8 mg/dL — ABNORMAL LOW (ref 8.9–10.3)
Chloride: 107 mmol/L (ref 98–111)
Creatinine, Ser: 1.04 mg/dL (ref 0.61–1.24)
GFR, Estimated: >60 mL/min (ref >60)
Glucose, Bld: 144 mg/dL — ABNORMAL HIGH (ref 70–99)
Potassium: 4.8 mmol/L (ref 3.5–5.1)
Sodium: 138 mmol/L (ref 135–145)

## 2021-06-21 LAB — CBC WITH DIFFERENTIAL/PLATELET
Abs Immature Granulocytes: 0.07 10*3/uL (ref 0.00–0.07)
Basophils Absolute: 0 10*3/uL (ref 0.0–0.1)
Basophils Relative: 0 %
Eosinophils Absolute: 0.1 10*3/uL (ref 0.0–0.5)
Eosinophils Relative: 2 %
HCT: 41.8 % (ref 39.0–52.0)
Hemoglobin: 13.6 g/dL (ref 13.0–17.0)
Immature Granulocytes: 2 %
Lymphocytes Relative: 13 %
Lymphs Abs: 0.6 10*3/uL — ABNORMAL LOW (ref 0.7–4.0)
MCH: 31 pg (ref 26.0–34.0)
MCHC: 32.5 g/dL (ref 30.0–36.0)
MCV: 95.2 fL (ref 80.0–100.0)
Monocytes Absolute: 0.4 10*3/uL (ref 0.1–1.0)
Monocytes Relative: 8 %
Neutro Abs: 3.3 10*3/uL (ref 1.7–7.7)
Neutrophils Relative %: 75 %
Platelets: 58 10*3/uL — ABNORMAL LOW (ref 150–400)
RBC: 4.39 MIL/uL (ref 4.22–5.81)
RDW: 15.4 % (ref 11.5–15.5)
WBC: 4.4 10*3/uL (ref 4.0–10.5)
nRBC: 0 % (ref 0.0–0.2)

## 2021-06-21 LAB — PROTIME-INR
INR: 1.3 — ABNORMAL HIGH (ref 0.8–1.2)
Prothrombin Time: 16.2 seconds — ABNORMAL HIGH (ref 11.4–15.2)

## 2021-06-21 MED ORDER — TIZANIDINE HCL 4 MG PO TABS
4.0000 mg | ORAL_TABLET | Freq: Three times a day (TID) | ORAL | Status: DC
  Administered 2021-06-21 – 2021-06-28 (×21): 4 mg via ORAL
  Filled 2021-06-21 (×23): qty 1

## 2021-06-21 NOTE — Progress Notes (Signed)
PROGRESS NOTE    OGDEN HANDLIN  OVF:[id number] DOB: [birth year] DOA: 06/12/2021 PCP: Sofie Hartigan, MD    Brief Narrative:  61 year old male with alcoholic liver cirrhosis admitted for uncontrolled back pain and right sided sciatica   5/13: Started Medrol Dosepak, Toradol 4 times daily.  PT, OT eval 5/14: PT, OT recommends SNF.  TOC aware 5/15: Right knee x-ray.  SNF search in progress 5/16: ESI planned under IR 5/17: Status post epidural spinal injection.  Back pain overall improved, but not yet back to baseline 5/18: Back pain persistent.  Most severe in the buttock and radiating down the leg 5/19.  Persistent back pain.  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   Hypoxia   Alcoholic cirrhosis of liver (HCC)   Esophageal varices without bleeding (HCC)   Gastro-esophageal reflux disease without esophagitis   Hypothyroidism, unspecified   Hypertension, essential, benign   Thrombocytopenia (HCC)   Right knee pain  * Back pain Status post [facility] 5/16 Initially back pain improved, now slightly worse on 5/18 No neurosurgical intervention warranted On Medrol Dosepak per neurosurgery recommendations Plan: Multimodal pain regimen Gabapentin 900 3 times daily DC Robaxin Start Zanaflex 4 mg 3 times daily Valium 10 mg twice daily Lidoderm patch Continue Medrol taper Continue therapy as tolerated Plan for discharge to skilled nursing facility once pain is better controlled We will reach out to neurosurgery for recommendations   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   Alcoholic cirrhosis of liver (HCC) Pt no longer drinks. F/u with [facility]  Imaging in past showed cholecystitis His LFTs are within normal limit and  total bilirubin was high but is improving.   It is possible he could have passed gallstone He is not coagulopathic thrombocytopenia makes him a dangerous surgical candidate   Esophageal varices without bleeding (HCC) dvt prophylaxis with heparin/Lovenox held due to low platelets.  Scd's. P.o. PPI     Gastro-esophageal reflux disease without esophagitis Continue Protonix     Hypothyroidism, unspecified Continue levothyroxine.    Hypertension, essential, benign  continue Coreg    Thrombocytopenia (HCC) Monitor platelet counts.  This is likely due to alcoholism Avoid Lovenox     Right knee pain Negative x-ray for any acute pathology.  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   This is likely from referred pain from the back.  Appreciate Ortho input No surgical intervention warranted   DVT prophylaxis: SCD Code Status: Full Family Communication: None today Disposition Plan: Status is: Inpatient Remains inpatient appropriate because: Acute low back pain.  Slow to recover.  Discharge to skilled nursing facility pending improvement in pain control  Level of care: Med-Surg  Consultants:  Neurosurgery  Procedures:  Epidural spinal injection 5/16  Antimicrobials: None   Subjective: Patient seen and examined.  Endorses persistent severe pain in bilateral lower extremities and low back  Objective: Vitals:   06/20/21 1516 06/20/21 2004 06/21/21 0345 06/21/21 0807  BP: 132/82 (!) 143/82 134/71 (!) 145/78  Pulse: 80 70 69 63  Resp: '16 16 16 20  '$ Temp: 98 F (36.7 C) 98 F (36.7 C) (!) 97.5 F (36.4 C) 97.6 F (36.4 C)  TempSrc: Oral Oral Oral   SpO2: 95% 99% 97% 99%  Weight:  Height:        Intake/Output Summary (Last 24 hours) at 06/21/2021 1053 Last data filed at 06/21/2021 0750 Gross per 24 hour  Intake 240 ml  Output 1600 ml  Net -1360 ml   Filed Weights   06/17/21 0500 06/19/21 0500 06/20/21 0600  Weight: 119.4 kg 119.2 kg 122.2 kg    Examination:  General  exam: Mild distress due to pain Respiratory system: Lungs clear.  Normal work of breathing.  Room air Cardiovascular system: S1-S2, RRR, no murmurs, no pedal edema Gastrointestinal system: Soft, NT/ND, normal bowel sounds Central nervous system: Alert and oriented. No focal neurological deficits. Extremities: Decreased power BLE  skin: No rashes, lesions or ulcers Psychiatry: Judgement and insight appear normal. Mood & affect appropriate.     Data Reviewed: I have personally reviewed following labs and imaging studies  CBC: Recent Labs  Lab 06/17/21 0606 06/19/21 0941  WBC 3.1* 6.9  NEUTROABS  --  5.7  HGB 13.5 14.9  HCT 40.2 45.5  MCV 93.1 95.0  PLT 48* 62*   Basic Metabolic Panel: Recent Labs  Lab 06/15/21 0552 06/17/21 0606 06/19/21 0941  NA 135 137 136  K 5.1 4.5 4.0  CL 105 104 104  CO2 '24 26 27  '$ GLUCOSE 131* 130* 158*  BUN 35* 24* 32*  CREATININE 1.35* 1.18 1.44*  CALCIUM 9.0 8.8* 8.9   GFR: Estimated Creatinine Clearance: 71.8 mL/min (A) (by C-G formula based on SCr of 1.44 mg/dL (H)). Liver Function Tests: No results for input(s): AST, ALT, ALKPHOS, BILITOT, PROT, ALBUMIN in the last 168 hours.  No results for input(s): LIPASE, AMYLASE in the last 168 hours.  No results for input(s): AMMONIA in the last 168 hours. Coagulation Profile: Recent Labs  Lab 06/19/21 0941  INR 1.3*   Cardiac Enzymes: No results for input(s): CKTOTAL, CKMB, CKMBINDEX, TROPONINI in the last 168 hours. BNP (last 3 results) No results for input(s): PROBNP in the last 8760 hours. HbA1C: No results for input(s): HGBA1C in the last 72 hours. CBG: No results for input(s): GLUCAP in the last 168 hours. Lipid Profile: No results for input(s): CHOL, HDL, LDLCALC, TRIG, CHOLHDL, LDLDIRECT in the last 72 hours. Thyroid Function Tests: No results for input(s): TSH, T4TOTAL, FREET4, T3FREE, THYROIDAB in the last 72 hours. Anemia Panel: No results for input(s): VITAMINB12, FOLATE,  FERRITIN, TIBC, IRON, RETICCTPCT in the last 72 hours. Sepsis Labs: No results for input(s): PROCALCITON, LATICACIDVEN in the last 168 hours.  No results found for this or any previous visit (from the past 240 hour(s)).       Radiology Studies: No results found.      Scheduled Meds:  amitriptyline  25 mg Oral QHS   carvedilol  6.25 mg Oral BID WC   diazepam  10 mg Oral BID   fluticasone  2 spray Each Nare Daily   gabapentin  900 mg Oral TID   levothyroxine  100 mcg Oral QAC breakfast   lidocaine  1 patch Transdermal Q24H   pantoprazole  40 mg Oral Daily   sodium chloride flush  3 mL Intravenous Q12H   thiamine  100 mg Oral Daily   tiZANidine  4 mg Oral TID   Continuous Infusions:   LOS: 8 days       Sidney Ace, MD [facility]   If 7PM-7AM, please contact night-coverage  06/21/2021, 10:53 AM

## 2021-06-22 DIAGNOSIS — M5440 Lumbago with sciatica, unspecified side: Secondary | ICD-10-CM | POA: Diagnosis not present

## 2021-06-22 MED ORDER — DIAZEPAM 5 MG PO TABS
5.0000 mg | ORAL_TABLET | Freq: Two times a day (BID) | ORAL | Status: DC
  Administered 2021-06-22 – 2021-06-28 (×12): 5 mg via ORAL
  Filled 2021-06-22 (×12): qty 1

## 2021-06-22 MED ORDER — GABAPENTIN 100 MG PO CAPS
200.0000 mg | ORAL_CAPSULE | Freq: Once | ORAL | Status: AC
  Administered 2021-06-22: 200 mg via ORAL
  Filled 2021-06-22: qty 2

## 2021-06-22 MED ORDER — GABAPENTIN 600 MG PO TABS
600.0000 mg | ORAL_TABLET | Freq: Three times a day (TID) | ORAL | Status: DC
  Administered 2021-06-22 – 2021-06-28 (×16): 600 mg via ORAL
  Filled 2021-06-22 (×16): qty 1

## 2021-06-22 MED ORDER — GABAPENTIN 400 MG PO CAPS
400.0000 mg | ORAL_CAPSULE | Freq: Three times a day (TID) | ORAL | Status: DC
  Administered 2021-06-22: 400 mg via ORAL
  Filled 2021-06-22: qty 1

## 2021-06-22 NOTE — Progress Notes (Signed)
Physical Therapy Treatment Patient Details Name: Shaun Weaver MRN: [id number] DOB: [birth year] Today's Date: 06/22/2021   History of Present Illness 61 year old male with alcoholic liver cirrhosis admitted for uncontrolled back pain and right sided sciatica    PT Comments    Pt ready for session.  Bed mobility and transition to sitting on his own with no facial grimaces to relay pain.  Stood to [employer] with min guard and better transition than last session.  He is able to take 1 step with WB RLE but then after stated sharp increase in pain and takes 3 hops NWB to recliner.  Unable to progress gait further.  Stood a second time for static standing and self selecting WB RLE but gait remains limited.  He does choose to stay in recliner.  Pt did state pain was different this date where it was previously more localized to back and upper thigh.  Stated it was radiating sharp to bottom of foot today.   Recommendations for follow up therapy are one component of a multi-disciplinary discharge planning process, led by the attending physician.  Recommendations may be updated based on patient status, additional functional criteria and insurance authorization.  Follow Up Recommendations  Skilled nursing-short term rehab (<3 hours/day)     Assistance Recommended at Discharge Intermittent Supervision/Assistance  Patient can return home with the following A little help with walking and/or transfers;A little help with bathing/dressing/bathroom;Assistance with cooking/housework;Assist for transportation;Help with stairs or ramp for entrance   Equipment Recommendations  BSC/3in1;Rolling walker (2 wheels);Wheelchair cushion (measurements PT);Wheelchair (measurements PT)    Recommendations for Other Services       Precautions / Restrictions Precautions Precautions: Fall Restrictions Weight Bearing Restrictions: No     Mobility  Bed Mobility Overal bed mobility: Independent Bed Mobility: Supine to  Sit Rolling: Independent   Supine to sit: Independent          Transfers Overall transfer level: Needs assistance Equipment used: Rolling walker (2 wheels) Transfers: Sit to/from Stand Sit to Stand: Min guard                Ambulation/Gait Ambulation/Gait assistance: Min guard Gait Distance (Feet): 3 Feet Assistive device: Rolling walker (2 wheels) Gait Pattern/deviations: Step-to pattern, Decreased step length - right, Decreased step length - left, Shuffle, Antalgic Gait velocity: dec     General Gait Details: 1 good step with WB but then pain sharply increases and he is unable to put pressure on RLE limiting to NWB for 3 steps   Stairs             Wheelchair Mobility    Modified Rankin (Stroke Patients Only)       Balance Overall balance assessment: Needs assistance Sitting-balance support: Feet supported Sitting balance-Leahy Scale: Good     Standing balance support: Bilateral upper extremity supported, Reliant on assistive device for balance Standing balance-Leahy Scale: Fair Standing balance comment: requires B UE support on RW to maintain balance while standing (avoids weight bearing on R LE).                            Cognition Arousal/Alertness: Awake/alert Behavior During Therapy: WFL for tasks assessed/performed Overall Cognitive Status: Within Functional Limits for tasks assessed                                 General Comments: decreased safety awareness  Exercises      General Comments        Pertinent Vitals/Pain Pain Assessment Pain Assessment: 0-10 Pain Score: 10-Worst pain ever Pain Location: comfortable at rest with bed mobility and transition to sitting but increases sharply with WB RLE Pain Descriptors / Indicators: Discomfort, Grimacing    Home Living                          Prior Function            PT Goals (current goals can now be found in the care plan  section) Progress towards PT goals: Progressing toward goals    Frequency    Min 2X/week      PT Plan Current plan remains appropriate    Co-evaluation              AM-PAC PT "6 Clicks" Mobility   Outcome Measure  Help needed turning from your back to your side while in a flat bed without using bedrails?: None Help needed moving from lying on your back to sitting on the side of a flat bed without using bedrails?: None Help needed moving to and from a bed to a chair (including a wheelchair)?: A Little Help needed standing up from a chair using your arms (e.g., wheelchair or bedside chair)?: A Little Help needed to walk in hospital room?: A Lot Help needed climbing 3-5 steps with a railing? : Total 6 Click Score: 17    End of Session Equipment Utilized During Treatment: Gait belt Activity Tolerance: Patient limited by pain Patient left: in chair;with call bell/phone within reach;with chair alarm set Nurse Communication: Mobility status PT Visit Diagnosis: Difficulty in walking, not elsewhere classified (R26.2);Pain;Muscle weakness (generalized) (M62.81) Pain - Right/Left: Right Pain - part of body: Leg     Time: 0311-0328 PT Time Calculation (min) (ACUTE ONLY): 17 min  Charges:  $Gait Training: 8-22 mins                   Chesley Noon, PTA 06/22/21, 3:35 PM

## 2021-06-22 NOTE — Progress Notes (Signed)
PROGRESS NOTE    Shaun Weaver  YVO:[id number] DOB: 02/17/1960 DOA: 06/12/2021 PCP: Sofie Hartigan, MD    Brief Narrative:  61 year old male with alcoholic liver cirrhosis admitted for uncontrolled back pain and right sided sciatica   5/13: Started Medrol Dosepak, Toradol 4 times daily.  PT, OT eval 5/14: PT, OT recommends SNF.  TOC aware 5/15: Right knee x-ray.  SNF search in progress 5/16: ESI planned under IR 5/17: Status post epidural spinal injection.  Back pain overall improved, but not yet back to baseline 5/18: Back pain persistent.  Most severe in the buttock and radiating down the leg 5/19.  Persistent back pain.  Not a good surgical candidate per nsg 5/21: Limited relief despite escalating doses of medications 5/22: Pain control seems to be improving though patient more lethargic today.  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   Hypoxia   Alcoholic cirrhosis of liver (HCC)   Esophageal varices without bleeding (HCC)   Gastro-esophageal reflux disease without esophagitis   Hypothyroidism, unspecified   Hypertension, essential, benign   Thrombocytopenia (HCC)   Right knee pain  * Back pain Status post [facility] 5/16 Initially back pain improved, now slightly worse on 5/18 No neurosurgical intervention warranted On Medrol Dosepak per neurosurgery recommendations Reengaged neurosurgery for comment on 5/21 Switch to tizanidine on 5/21 with good result Plan: Continue following pain management.  Gabapentin 400 mg 3 times daily, Zanaflex 4 mg 3 times daily, Valium 5 mg twice daily, Lidoderm patch.  Physical therapy as tolerated.  Patient not a surgical candidate at this time.  Will need several weeks of medical therapy, physical therapy and frequent evaluations.  Can consider neurosurgical decompression in 4 to 6 weeks if pain remains uncontrolled.    Sciatica of right side associated with disorder of  lumbosacral spine As above   Hypoxia, resolved He is on room air now negative dVT.   Alcoholic cirrhosis of liver (HCC) Pt no longer drinks. F/u with [facility]  Imaging in past showed cholecystitis His LFTs are within normal limit and total bilirubin was high but is improving.   It is possible he could have passed gallstone He is not coagulopathic thrombocytopenia makes him a dangerous surgical candidate   Esophageal varices without bleeding (HCC) dvt prophylaxis with heparin/Lovenox held due to low platelets.  Scd's. P.o. PPI     Gastro-esophageal reflux disease without esophagitis Continue Protonix     Hypothyroidism, unspecified Continue levothyroxine.    Hypertension, essential, benign  continue Coreg    Thrombocytopenia (HCC) Monitor platelet counts.  This is likely due to alcoholism Avoid Lovenox     Right knee pain Negative x-ray for any acute pathology.  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   This is likely from referred pain from the back.  Appreciate Ortho input No surgical intervention warranted Multimodal pain regimen as above   DVT prophylaxis: SCD Code Status: Full Family Communication: None today Disposition Plan: Status is: Inpatient Remains inpatient appropriate because: Acute low back pain.  Slow to recover.  Discharge to skilled nursing facility pending improvement in pain control.  Hopeful for 24 to 48 hours  Level of care: Med-Surg  Consultants:  Neurosurgery  Procedures:  Epidural spinal injection 5/16  Antimicrobials: None   Subjective: Patient seen and examined.  Endorses persistent severe pain in bilateral lower extremities and low back, more lethargic this morning.  Seems to be improving from a pain control standpoint  Objective: Vitals:   06/21/21 2036 06/22/21 0500 06/22/21 0543 06/22/21 0804  BP: 125/77  115/63 (!) 116/56  Pulse: 62  68 (!) 59  Resp: 16  18   Temp: 98.1 F (36.7 C)  98.1 F (36.7 C) 98 F (36.7 C)  TempSrc:       SpO2: 99%  94% 96%  Weight:  123.5 kg    Height:        Intake/Output Summary (Last 24 hours) at 06/22/2021 1110 Last data filed at 06/22/2021 1047 Gross per 24 hour  Intake 360 ml  Output 700 ml  Net -340 ml   Filed Weights   06/19/21 0500 06/20/21 0600 06/22/21 0500  Weight: 119.2 kg 122.2 kg 123.5 kg    Examination:  General exam: NAD.  Appears lethargic Respiratory system: Lungs clear.  Normal work of breathing.  Room air Cardiovascular system: S1-S2, RRR, no murmurs, no pedal edema Gastrointestinal system: Soft, NT/ND, normal bowel sounds Central nervous system: Alert and oriented. No focal neurological deficits. Extremities: Decreased power BLE.  Decreased range of motion skin: No rashes, lesions or ulcers Psychiatry: Judgement and insight appear normal. Mood & affect appropriate.     Data Reviewed: I have personally reviewed following labs and imaging studies  CBC: Recent Labs  Lab 06/17/21 0606 06/19/21 0941 06/21/21 1124  WBC 3.1* 6.9 4.4  NEUTROABS  --  5.7 3.3  HGB 13.5 14.9 13.6  HCT 40.2 45.5 41.8  MCV 93.1 95.0 95.2  PLT 48* 62* 58*   Basic Metabolic Panel: Recent Labs  Lab 06/17/21 0606 06/19/21 0941 06/21/21 1124  NA 137 136 138  K 4.5 4.0 4.8  CL 104 104 107  CO2 '26 27 28  '$ GLUCOSE 130* 158* 144*  BUN 24* 32* 19  CREATININE 1.18 1.44* 1.04  CALCIUM 8.8* 8.9 8.8*   GFR: Estimated Creatinine Clearance: 100 mL/min (by C-G formula based on SCr of 1.04 mg/dL). Liver Function Tests: No results for input(s): AST, ALT, ALKPHOS, BILITOT, PROT, ALBUMIN in the last 168 hours.  No results for input(s): LIPASE, AMYLASE in the last 168 hours.  No results for input(s): AMMONIA in the last 168 hours. Coagulation Profile: Recent Labs  Lab 06/19/21 0941 06/21/21 1124  INR 1.3* 1.3*   Cardiac Enzymes: No results for input(s): CKTOTAL, CKMB, CKMBINDEX, TROPONINI in the last 168 hours. BNP (last 3 results) No results for input(s): PROBNP in the  last 8760 hours. HbA1C: No results for input(s): HGBA1C in the last 72 hours. CBG: No results for input(s): GLUCAP in the last 168 hours. Lipid Profile: No results for input(s): CHOL, HDL, LDLCALC, TRIG, CHOLHDL, LDLDIRECT in the last 72 hours. Thyroid Function Tests: No results for input(s): TSH, T4TOTAL, FREET4, T3FREE, THYROIDAB in the last 72 hours. Anemia Panel: No results for input(s): VITAMINB12, FOLATE, FERRITIN, TIBC, IRON, RETICCTPCT in the last 72 hours. Sepsis Labs: No results for input(s): PROCALCITON, LATICACIDVEN in the last 168 hours.  No results found for this or any previous visit (from the past 240 hour(s)).       Radiology Studies: No results found.      Scheduled Meds:  amitriptyline  25 mg Oral QHS   carvedilol  6.25 mg Oral BID WC   diazepam  5 mg Oral BID   fluticasone  2 spray Each Nare Daily   gabapentin  400 mg Oral TID   levothyroxine  100 mcg Oral QAC breakfast   lidocaine  1 patch Transdermal Q24H   pantoprazole  40  mg Oral Daily   sodium chloride flush  3 mL Intravenous Q12H   thiamine  100 mg Oral Daily   tiZANidine  4 mg Oral TID   Continuous Infusions:   LOS: 9 days       Sidney Ace, MD [facility]   If 7PM-7AM, please contact night-coverage  06/22/2021, 11:10 AM

## 2021-06-22 NOTE — Progress Notes (Signed)
    Attending Progress Note  History: Shaun Weaver is here for R lumbar radiculopathy.   06/21/2021: He has continued pain.  He has trouble with ambulation due to pain.  Physical Exam: Vitals:   06/21/21 2036 06/22/21 0543  BP: 125/77 115/63  Pulse: 62 68  Resp: 16 18  Temp: 98.1 F (36.7 C) 98.1 F (36.7 C)  SpO2: 99% 94%    AA Ox3 CNI  Strength:5/5 throughout BLE with significant pain limitation RLE   Data:  Recent Labs  Lab 06/17/21 0606 06/19/21 0941 06/21/21 1124  NA 137 136 138  K 4.5 4.0 4.8  CL 104 104 107  CO2 '26 27 28  '$ BUN 24* 32* 19  CREATININE 1.18 1.44* 1.04  GLUCOSE 130* 158* 144*  CALCIUM 8.8* 8.9 8.8*   No results for input(s): AST, ALT, ALKPHOS in the last 168 hours.  Invalid input(s): TBILI   Recent Labs  Lab 06/17/21 0606 06/19/21 0941 06/21/21 1124  WBC 3.1* 6.9 4.4  HGB 13.5 14.9 13.6  HCT 40.2 45.5 41.8  PLT 48* 62* 58*   Recent Labs  Lab 06/19/21 0941 06/21/21 1124  INR 1.3* 1.3*         Other tests/results: MRI reviewed previously  Assessment/Plan:  Shaun Weaver is here for what appears to be a R lumbar radiculopathy.  He had an ESI on 06/16/21.  - mobilize - pain control - has tried many different options without adequate relief - DVT prophylaxis - PTOT - He is not currently a surgical candidate due to thrombocytopenia and recent ESI, which puts him at elevated risk of infection. - He may require placement to give him time for the ESI to kick in.  I would normally want to give the ESI 6 weeks before consideration of surgery, plus this patient would require some optimization to get his platelet count up.    Meade Maw MD, [facility] Department of Neurosurgery

## 2021-06-23 ENCOUNTER — Inpatient Hospital Stay: Payer: BC Managed Care – PPO

## 2021-06-23 DIAGNOSIS — M5440 Lumbago with sciatica, unspecified side: Secondary | ICD-10-CM | POA: Diagnosis not present

## 2021-06-23 LAB — CBC WITH DIFFERENTIAL/PLATELET
Abs Immature Granulocytes: 0.04 10*3/uL (ref 0.00–0.07)
Basophils Absolute: 0 10*3/uL (ref 0.0–0.1)
Basophils Relative: 0 %
Eosinophils Absolute: 0 10*3/uL (ref 0.0–0.5)
Eosinophils Relative: 0 %
HCT: 42.1 % (ref 39.0–52.0)
Hemoglobin: 13.8 g/dL (ref 13.0–17.0)
Immature Granulocytes: 1 %
Lymphocytes Relative: 11 %
Lymphs Abs: 0.5 10*3/uL — ABNORMAL LOW (ref 0.7–4.0)
MCH: 30.8 pg (ref 26.0–34.0)
MCHC: 32.8 g/dL (ref 30.0–36.0)
MCV: 94 fL (ref 80.0–100.0)
Monocytes Absolute: 0.4 10*3/uL (ref 0.1–1.0)
Monocytes Relative: 10 %
Neutro Abs: 3.3 10*3/uL (ref 1.7–7.7)
Neutrophils Relative %: 78 %
Platelets: 46 10*3/uL — ABNORMAL LOW (ref 150–400)
RBC: 4.48 MIL/uL (ref 4.22–5.81)
RDW: 15.4 % (ref 11.5–15.5)
WBC: 4.3 10*3/uL (ref 4.0–10.5)
nRBC: 0 % (ref 0.0–0.2)

## 2021-06-23 LAB — BASIC METABOLIC PANEL
Anion gap: 7 (ref 5–15)
BUN: 16 mg/dL (ref 8–23)
CO2: 22 mmol/L (ref 22–32)
Calcium: 9.1 mg/dL (ref 8.9–10.3)
Chloride: 103 mmol/L (ref 98–111)
Creatinine, Ser: 1.06 mg/dL (ref 0.61–1.24)
GFR, Estimated: >60 mL/min (ref >60)
Glucose, Bld: 117 mg/dL — ABNORMAL HIGH (ref 70–99)
Potassium: 4.2 mmol/L (ref 3.5–5.1)
Sodium: 132 mmol/L — ABNORMAL LOW (ref 135–145)

## 2021-06-23 LAB — PROCALCITONIN: Procalcitonin: 0.15 ng/mL

## 2021-06-23 MED ORDER — SODIUM CHLORIDE 0.9 % IV SOLN
2.0000 g | INTRAVENOUS | Status: DC
  Administered 2021-06-23: 2 g via INTRAVENOUS
  Filled 2021-06-23: qty 20

## 2021-06-23 MED ORDER — SODIUM CHLORIDE 0.9 % IV SOLN
500.0000 mg | INTRAVENOUS | Status: DC
  Administered 2021-06-23: 500 mg via INTRAVENOUS
  Filled 2021-06-23: qty 5

## 2021-06-23 MED ORDER — IPRATROPIUM-ALBUTEROL 0.5-2.5 (3) MG/3ML IN SOLN: 3.0000 mL | RESPIRATORY_TRACT | Status: DC | PRN

## 2021-06-23 NOTE — Progress Notes (Signed)
PROGRESS NOTE    LEVITICUS HARTON  DTO:[id number] DOB: Feb 17, 1960 DOA: 06/12/2021 PCP: Sofie Hartigan, MD    Brief Narrative:  61 year old male with alcoholic liver cirrhosis admitted for uncontrolled back pain and right sided sciatica   5/13: Started Medrol Dosepak, Toradol 4 times daily.  PT, OT eval 5/14: PT, OT recommends SNF.  TOC aware 5/15: Right knee x-ray.  SNF search in progress 5/16: ESI planned under IR 5/17: Status post epidural spinal injection.  Back pain overall improved, but not yet back to baseline 5/18: Back pain persistent.  Most severe in the buttock and radiating down the leg 5/19.  Persistent back pain.  Not a good surgical candidate per nsg 5/21: Limited relief despite escalating doses of medications 5/22: Pain control seems to be improving though patient more lethargic today. 5/23: Pain control seems to be improving however patient appears somewhat more delirious today.  Endorsing some difficulty breathing.  Low-grade temperature noted 100.8.  Chest x-ray with evolving left infiltrate concerning for CAP.  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   Hypoxia   Alcoholic cirrhosis of liver (HCC)   Esophageal varices without bleeding (HCC)   Gastro-esophageal reflux disease without esophagitis   Hypothyroidism, unspecified   Hypertension, essential, benign   Thrombocytopenia (HCC)   Right knee pain  No fever Tmax 100.8 noted 5/23 Chest x-ray with left infiltrate concerning for pneumonia No sepsis physiology Plan: Empiric Rocephin and azithromycin Follow blood cultures drawn before initiation of antibiotics Monitor vitals and fever curve APAP as needed fever  * Back pain Status post [facility] 5/16 Initially back pain improved, now slightly worse on 5/18 No neurosurgical intervention warranted On Medrol Dosepak per neurosurgery recommendations Reengaged neurosurgery for comment on  5/21 Switch to tizanidine on 5/21 with good result Plan: Continue following pain management.  Gabapentin 400 mg 3 times daily, Zanaflex 4 mg 3 times daily, Valium 5 mg twice daily, Lidoderm patch.  Physical therapy as tolerated.  Patient not a surgical candidate at this time.  Will need several weeks of medical therapy, physical therapy and frequent evaluations.  Can consider neurosurgical decompression in 4 to 6 weeks if pain remains uncontrolled.  Pain control is been quite challenging    Sciatica of right side associated with disorder of lumbosacral spine As above   Hypoxia, resolved He is on room air now negative dVT.   Alcoholic cirrhosis of liver (HCC) Pt no longer drinks. F/u with [facility]  Imaging in past showed cholecystitis His LFTs are within normal limit and total bilirubin was high but is improving.   It is possible he could have passed gallstone He is not coagulopathic thrombocytopenia makes him a dangerous surgical candidate   Esophageal varices without bleeding (HCC) dvt prophylaxis with heparin/Lovenox held due to low platelets.  Scd's. P.o. PPI     Gastro-esophageal reflux disease without esophagitis Continue Protonix     Hypothyroidism, unspecified Continue levothyroxine.    Hypertension, essential, benign  continue Coreg    Thrombocytopenia (HCC) Monitor platelet counts.  This is likely due to alcoholism Avoid Lovenox     Right knee pain Negative x-ray for any acute pathology.  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   This is likely from referred pain from the back.  Appreciate Ortho input No surgical intervention warranted Multimodal pain regimen as above   DVT prophylaxis: SCD Code Status: Full Family Communication: Sister via phone 5/23 Disposition Plan: Status is:  Inpatient Remains inpatient appropriate because: Acute low back pain.  Slow to recover.  New onset fever.  Needs 24-hour fever free prior to discharge to skilled nursing facility.  Level of care:  Med-Surg  Consultants:  Neurosurgery  Procedures:  Epidural spinal injection 5/16  Antimicrobials: None   Subjective: Patient seen and examined.  Endorses some shortness of breath this morning.  Objective: Vitals:   06/22/21 1522 06/22/21 2022 06/23/21 0220 06/23/21 0754  BP: 135/80 (!) 144/69 135/68 134/73  Pulse: 88 76 95 (!) 104  Resp:  18 18   Temp: 97.9 F (36.6 C) 98.8 F (37.1 C) (!) 100.8 F (38.2 C) 99.4 F (37.4 C)  TempSrc:    Oral  SpO2: 99% 98% 98% 98%  Weight:      Height:        Intake/Output Summary (Last 24 hours) at 06/23/2021 1233 Last data filed at 06/23/2021 0500 Gross per 24 hour  Intake 480 ml  Output 3 ml  Net 477 ml   Filed Weights   06/19/21 0500 06/20/21 0600 06/22/21 0500  Weight: 119.2 kg 122.2 kg 123.5 kg    Examination:  General exam: NAD.  Appears uncomfortable Respiratory system: Left-sided crackles.  Normal work of breathing.  Room air Cardiovascular system: S1-S2, RRR, no murmurs, no pedal edema Gastrointestinal system: Soft, NT/ND, normal bowel sounds Central nervous system: Alert and oriented. No focal neurological deficits. Extremities: Decreased power BLE.  Decreased range of motion skin: No rashes, lesions or ulcers Psychiatry: Judgement and insight appear normal. Mood & affect appropriate.     Data Reviewed: I have personally reviewed following labs and imaging studies  CBC: Recent Labs  Lab 06/17/21 0606 06/19/21 0941 06/21/21 1124 06/23/21 0928  WBC 3.1* 6.9 4.4 4.3  NEUTROABS  --  5.7 3.3 3.3  HGB 13.5 14.9 13.6 13.8  HCT 40.2 45.5 41.8 42.1  MCV 93.1 95.0 95.2 94.0  PLT 48* 62* 58* 46*   Basic Metabolic Panel: Recent Labs  Lab 06/17/21 0606 06/19/21 0941 06/21/21 1124 06/23/21 0928  NA 137 136 138 132*  K 4.5 4.0 4.8 4.2  CL 104 104 107 103  CO2 '26 27 28 22  '$ GLUCOSE 130* 158* 144* 117*  BUN 24* 32* 19 16  CREATININE 1.18 1.44* 1.04 1.06  CALCIUM 8.8* 8.9 8.8* 9.1   GFR: Estimated  Creatinine Clearance: 98.1 mL/min (by C-G formula based on SCr of 1.06 mg/dL). Liver Function Tests: No results for input(s): AST, ALT, ALKPHOS, BILITOT, PROT, ALBUMIN in the last 168 hours.  No results for input(s): LIPASE, AMYLASE in the last 168 hours.  No results for input(s): AMMONIA in the last 168 hours. Coagulation Profile: Recent Labs  Lab 06/19/21 0941 06/21/21 1124  INR 1.3* 1.3*   Cardiac Enzymes: No results for input(s): CKTOTAL, CKMB, CKMBINDEX, TROPONINI in the last 168 hours. BNP (last 3 results) No results for input(s): PROBNP in the last 8760 hours. HbA1C: No results for input(s): HGBA1C in the last 72 hours. CBG: No results for input(s): GLUCAP in the last 168 hours. Lipid Profile: No results for input(s): CHOL, HDL, LDLCALC, TRIG, CHOLHDL, LDLDIRECT in the last 72 hours. Thyroid Function Tests: No results for input(s): TSH, T4TOTAL, FREET4, T3FREE, THYROIDAB in the last 72 hours. Anemia Panel: No results for input(s): VITAMINB12, FOLATE, FERRITIN, TIBC, IRON, RETICCTPCT in the last 72 hours. Sepsis Labs: Recent Labs  Lab 06/23/21 0928  PROCALCITON 0.15    No results found for this or any previous visit (from  the past 240 hour(s)).       Radiology Studies: DG Chest Port 1 View  Result Date: 06/23/2021 CLINICAL DATA:  Fever.  Possible pneumonia. EXAM: PORTABLE CHEST 1 VIEW COMPARISON:  Chest x-ray December 14, 2010. FINDINGS: Mild lateral left basilar airspace opacity. No visible pleural effusions or pneumothorax. Similar cardiomediastinal silhouette, probably accentuated by AP technique. No displaced fracture. IMPRESSION: Mild lateral left basilar airspace opacity, potentially pneumonia. Electronically Signed   By: Margaretha Sheffield M.D.   On: 06/23/2021 10:29        Scheduled Meds:  amitriptyline  25 mg Oral QHS   carvedilol  6.25 mg Oral BID WC   diazepam  5 mg Oral BID   fluticasone  2 spray Each Nare Daily   gabapentin  600 mg Oral TID    levothyroxine  100 mcg Oral QAC breakfast   lidocaine  1 patch Transdermal Q24H   pantoprazole  40 mg Oral Daily   sodium chloride flush  3 mL Intravenous Q12H   thiamine  100 mg Oral Daily   tiZANidine  4 mg Oral TID   Continuous Infusions:   LOS: 10 days       Sidney Ace, MD [facility]   If 7PM-7AM, please contact night-coverage  06/23/2021, 12:33 PM

## 2021-06-24 ENCOUNTER — Inpatient Hospital Stay (HOSPITAL_COMMUNITY)
Admit: 2021-06-24 | Discharge: 2021-06-24 | Disposition: A | Discharge status: Home / Self Care | Payer: BC Managed Care – PPO | Attending: Internal Medicine | Admitting: Internal Medicine

## 2021-06-24 DIAGNOSIS — B9561 Methicillin susceptible Staphylococcus aureus infection as the cause of diseases classified elsewhere: Secondary | ICD-10-CM

## 2021-06-24 DIAGNOSIS — R7881 Bacteremia: Secondary | ICD-10-CM

## 2021-06-24 DIAGNOSIS — M5441 Lumbago with sciatica, right side: Secondary | ICD-10-CM | POA: Diagnosis not present

## 2021-06-24 LAB — BLOOD CULTURE ID PANEL (REFLEXED) - BCID2

## 2021-06-24 LAB — ECHOCARDIOGRAM COMPLETE
Height: 72 in
S' Lateral: 3.4 cm
Weight: 4356.29 oz

## 2021-06-24 MED ORDER — SODIUM CHLORIDE 0.9 % IV SOLN: INTRAVENOUS | Status: DC | PRN

## 2021-06-24 MED ORDER — CEFAZOLIN SODIUM-DEXTROSE 2-4 GM/100ML-% IV SOLN
2.0000 g | Freq: Three times a day (TID) | INTRAVENOUS | Status: DC
  Administered 2021-06-24 – 2021-06-28 (×13): 2 g via INTRAVENOUS
  Filled 2021-06-24 (×16): qty 100

## 2021-06-24 NOTE — Progress Notes (Signed)
   06/24/21 0830  Assess: MEWS Score  Temp (!) 102.1 F (38.9 C)  Assess: MEWS Score  MEWS Temp 2  MEWS Systolic 0  MEWS Pulse 0  MEWS RR 0  MEWS LOC 0  MEWS Score 2  MEWS Score Color Yellow  Assess: if the MEWS score is Yellow or Red  Were vital signs taken at a resting state? Yes  Focused Assessment No change from prior assessment  Does the patient meet 2 or more of the SIRS criteria? Yes  Does the patient have a confirmed or suspected source of infection? Yes  Provider and Rapid Response Notified? Yes  MEWS guidelines implemented *See Row Information* Yes  Treat  MEWS Interventions Administered prn meds/treatments  Escalate  MEWS: Escalate Yellow: discuss with charge nurse/RN and consider discussing with provider and RRT  Notify: Charge Nurse/RN  Name of Charge Nurse/RN Notified Jessi, RN  Date Charge Nurse/RN Notified 06/24/21  Time Charge Nurse/RN Notified 0845  Notify: Provider  Provider Name/Title Dr. Si Raider  Date Provider Notified 06/24/21  Time Provider Notified 0845  Method of Notification Page (secure chat)  Notification Reason Change in status  Document  Progress note created (see row info) Yes  Assess: SIRS CRITERIA  SIRS Temperature  1  SIRS Pulse 1  SIRS Respirations  0  SIRS WBC 0  SIRS Score Sum  2

## 2021-06-24 NOTE — Progress Notes (Signed)
Physical Therapy Treatment Patient Details Name: Shaun Weaver MRN: [id number] DOB: 31-Mar-1960 Today's Date: 06/24/2021   History of Present Illness 61 year old male with alcoholic liver cirrhosis admitted for uncontrolled back pain and right sided sciatica    PT Comments    Pt was long sitting in bed, alert and oriented. He agrees to session with encouragement. He has been and continues to be severely limited by pain. " It doesn't hurt until I move or stand up." Pt endorses 2/10 pain that quickly elevated to 9-10/10. He mostly performs TTWB/NWB with RLE due to the pain. He is WBAT but unable to tolerate much. Pt lives alone and can not currently take care of himself. Author highly recommends SNF currently but does expect pt to progress quickly once pain is less limiting. Acute PT will continue to follow and progress as able per current POC.    Recommendations for follow up therapy are one component of a multi-disciplinary discharge planning process, led by the attending physician.  Recommendations may be updated based on patient status, additional functional criteria and insurance authorization.  Follow Up Recommendations  Skilled nursing-short term rehab (<3 hours/day) (expect pt to progress quickly once pain is better controlled)     Assistance Recommended at Discharge Intermittent Supervision/Assistance  Patient can return home with the following A little help with walking and/or transfers;A little help with bathing/dressing/bathroom;Assistance with cooking/housework;Assist for transportation;Help with stairs or ramp for entrance   Equipment Recommendations  BSC/3in1;Rolling walker (2 wheels);Wheelchair cushion (measurements PT);Wheelchair (measurements PT)       Precautions / Restrictions Precautions Precautions: Fall Restrictions Weight Bearing Restrictions: No     Mobility  Bed Mobility Overal bed mobility: Needs Assistance Bed Mobility: Rolling, Sidelying to Sit, Supine to  Sit Rolling: Supervision Sidelying to sit: Mod assist       General bed mobility comments: pt was severely limited by pain    Transfers Overall transfer level: Needs assistance Equipment used: Rolling walker (2 wheels) Transfers: Sit to/from Stand Sit to Stand: Min guard           General transfer comment: CGA for safety. Pt is  pain limited    Ambulation/Gait Ambulation/Gait assistance: Min guard Gait Distance (Feet): 3 Feet Assistive device: Rolling walker (2 wheels) Gait Pattern/deviations: Step-to pattern, Decreased step length - right, Decreased step length - left, Shuffle, Antalgic Gait velocity: dec     General Gait Details: Pt was able to take a few steps from EOB to recliner. Needs increased time due to pain. Mostly TTWB due to pain     Balance Overall balance assessment: Needs assistance Sitting-balance support: Feet supported Sitting balance-Leahy Scale: Good     Standing balance support: Bilateral upper extremity supported, Reliant on assistive device for balance Standing balance-Leahy Scale: Fair      Cognition Arousal/Alertness: Awake/alert Behavior During Therapy: WFL for tasks assessed/performed Overall Cognitive Status: Within Functional Limits for tasks assessed      General Comments: Pt is A and O x 4 but contionues to be severely limited by pain               Pertinent Vitals/Pain Pain Assessment Pain Assessment: No/denies pain Pain Score: 0-No pain     PT Goals (current goals can now be found in the care plan section) Acute Rehab PT Goals Patient Stated Goal: Get my pain under control Progress towards PT goals: Progressing toward goals    Frequency    Min 2X/week      PT Plan Current  plan remains appropriate       AM-PAC PT "6 Clicks" Mobility   Outcome Measure  Help needed turning from your back to your side while in a flat bed without using bedrails?: None Help needed moving from lying on your back to sitting on  the side of a flat bed without using bedrails?: None Help needed moving to and from a bed to a chair (including a wheelchair)?: A Little Help needed standing up from a chair using your arms (e.g., wheelchair or bedside chair)?: A Little Help needed to walk in hospital room?: A Lot   6 Click Score: 16    End of Session   Activity Tolerance: Patient limited by pain Patient left: in chair;with call bell/phone within reach;with chair alarm set Nurse Communication: Mobility status PT Visit Diagnosis: Difficulty in walking, not elsewhere classified (R26.2);Pain;Muscle weakness (generalized) (M62.81) Pain - Right/Left: Right Pain - part of body: Leg     Time: 2595-6387 PT Time Calculation (min) (ACUTE ONLY): 20 min  Charges:  $Therapeutic Activity: 8-22 mins                    Julaine Fusi PTA 06/24/21, 1:49 PM

## 2021-06-24 NOTE — Progress Notes (Signed)
       CROSS COVER NOTE  NAME: HILLIARD BORGES MRN: [id number] DOB : 09-14-1960  New BCID results called on Mr Mauch. Staph aureus grew in 3 of 4 bottles, no resistance.  Plan: MSSA Bacteremia - Cefazolin per pharmacy, d/c empiric Ceftriaxone and Azithromycin - ECHO  Alesia Banda, MHA, FNP-BC Nurse Practitioner [facility] Pager [phone]

## 2021-06-24 NOTE — TOC Progression Note (Signed)
Transition of Care [facility]) - Progression Note    Patient Details  Name: Shaun Weaver MRN: [id number] Date of Birth: [birth year]  Transition of Care [facility] CM/SW Contact  Beverly Sessions, RN Phone Number: 06/24/2021, 3:10 PM  Clinical Narrative:    Per Lavella Lemons with [facility] bed still available for patient at discharge She states that she has confirmed with business office that patient will not require authorization to admit to SNF   Expected Discharge Plan: [place] Barriers to Discharge: Continued Medical Work up  Expected Discharge Plan and Services Expected Discharge Plan: [place] arrangements for the past 2 months: Single Family Home                                       Social Determinants of Health (SDOH) Interventions    Readmission Risk Interventions     View : No data to display.

## 2021-06-24 NOTE — Progress Notes (Signed)
Occupational Therapy Treatment Patient Details Name: Shaun Weaver MRN: [id number] DOB: [birth year] Today's Date: 06/24/2021   History of present illness 61 year old male with alcoholic liver cirrhosis admitted for uncontrolled back pain and right sided sciatica   OT comments  Pt seen for OT treatment on this date. Upon arrival to room pt awake and alert laying in bed. Pt able to log roll and sit at EOB with SUPERVISION for safety. Pt agreeable to tx. Pt able to Pt required SETUP for grooming tasks before standing. Upon standing, CGA + RW with BUE support for balance during grooming tasks for ~3 minutes. Pt activity tolerance limited by pain. Pt reported 10/10 pain in RLE in standing. Pt making good progress toward goals. Goals remain appropriate, continue to follow POC. Discharge recommendation remains appropriate.     Recommendations for follow up therapy are one component of a multi-disciplinary discharge planning process, led by the attending physician.  Recommendations may be updated based on patient status, additional functional criteria and insurance authorization.    Follow Up Recommendations  Skilled nursing-short term rehab (<3 hours/day)    Assistance Recommended at Discharge Intermittent Supervision/Assistance  Patient can return home with the following  A lot of help with walking and/or transfers;A lot of help with bathing/dressing/bathroom;Help with stairs or ramp for entrance   Equipment Recommendations  BSC/3in1       Precautions / Restrictions Precautions Precautions: Fall Restrictions Weight Bearing Restrictions: No       Mobility Bed Mobility Overal bed mobility: Needs Assistance Bed Mobility: Rolling, Sidelying to Sit, Sit to Supine Rolling: Supervision Sidelying to sit: Supervision   Sit to supine: Supervision        Transfers Overall transfer level: Needs assistance Equipment used: Rolling walker (2 wheels) Transfers: Sit to/from Stand Sit to Stand:  Min guard           General transfer comment: CGA for safety. Pt is pain limited     Balance Overall balance assessment: Needs assistance Sitting-balance support: Feet supported Sitting balance-Leahy Scale: Good     Standing balance support: Bilateral upper extremity supported, During functional activity, Reliant on assistive device for balance Standing balance-Leahy Scale: Fair                             ADL either performed or assessed with clinical judgement   ADL Overall ADL's : Needs assistance/impaired                                       General ADL Comments: SETUP required for grooming tasks before standing. CGA + RW with BUE support for balance during grooming tasks in standing ~3 minutes.      Cognition Arousal/Alertness: Awake/alert Behavior During Therapy: WFL for tasks assessed/performed Overall Cognitive Status: Within Functional Limits for tasks assessed                                                     Pertinent Vitals/ Pain       Pain Assessment Pain Assessment: 0-10 Pain Score: 10-Worst pain ever Pain Location:  (RLE) Pain Descriptors / Indicators: Radiating Pain Intervention(s): Repositioned, Limited activity within patient's tolerance   Frequency  Min 2X/week  Progress Toward Goals  OT Goals(current goals can now be found in the care plan section)  Progress towards OT goals: Progressing toward goals  Acute Rehab OT Goals Patient Stated Goal: To improve pain OT Goal Formulation: With patient Time For Goal Achievement: 07/08/21 Potential to Achieve Goals: Good ADL Goals Pt Will Perform Grooming: with modified independence;standing Pt Will Perform Lower Body Dressing: with modified independence;with caregiver independent in assisting;sit to/from stand Pt Will Transfer to Toilet: with modified independence;ambulating;regular height toilet  Plan Discharge plan remains  appropriate;Frequency remains appropriate       AM-PAC OT "6 Clicks" Daily Activity     Outcome Measure   Help from another person eating meals?: None Help from another person taking care of personal grooming?: A Little Help from another person toileting, which includes using toliet, bedpan, or urinal?: A Little Help from another person bathing (including washing, rinsing, drying)?: A Lot Help from another person to put on and taking off regular upper body clothing?: A Little Help from another person to put on and taking off regular lower body clothing?: A Lot 6 Click Score: 17    End of Session Equipment Utilized During Treatment: Rolling walker (2 wheels)  OT Visit Diagnosis: Other abnormalities of gait and mobility (R26.89);Muscle weakness (generalized) (M62.81)   Activity Tolerance Patient tolerated treatment well   Patient Left in bed;with call bell/phone within reach (Pt left with MD in room)           Time: 0867-6195 OT Time Calculation (min): 13 min  Charges: OT General Charges $OT Visit: 1 Visit OT Treatments $Self Care/Home Management : 8-22 mins  [employer] OTDS  [employer] 06/24/2021, 3:23 PM

## 2021-06-24 NOTE — Plan of Care (Signed)
  Problem: Education: Goal: Knowledge of General Education information will improve Description: Including pain rating scale, medication(s)/side effects and non-pharmacologic comfort measures Outcome: Progressing   Problem: Nutrition: Goal: Adequate nutrition will be maintained Outcome: Progressing   Problem: Elimination: Goal: Will not experience complications related to urinary retention Outcome: Progressing   

## 2021-06-24 NOTE — Progress Notes (Signed)
*  PRELIMINARY RESULTS* Echocardiogram 2D Echocardiogram has been performed.  Shaun Weaver 06/24/2021, 1:47 PM

## 2021-06-24 NOTE — Progress Notes (Addendum)
PROGRESS NOTE    ASER NYLUND  IEP:[id number] DOB: 1960-07-07 DOA: 06/12/2021 PCP: Sofie Hartigan, MD    Brief Narrative:  61 year old male with alcoholic liver cirrhosis admitted for uncontrolled back pain and right sided sciatica   5/13: Started Medrol Dosepak, Toradol 4 times daily.  PT, OT eval 5/14: PT, OT recommends SNF.  TOC aware 5/15: Right knee x-ray.  SNF search in progress 5/16: ESI planned under IR 5/17: Status post epidural spinal injection.  Back pain overall improved, but not yet back to baseline 5/18: Back pain persistent.  Most severe in the buttock and radiating down the leg 5/19.  Persistent back pain.  Not a good surgical candidate per nsg 5/21: Limited relief despite escalating doses of medications 5/22: Pain control seems to be improving though patient more lethargic today. 5/23: Pain control seems to be improving however patient appears somewhat more delirious today.  Endorsing some difficulty breathing.  Low-grade temperature noted 100.8.  Chest x-ray with evolving left infiltrate concerning for CAP. But blood cultures mssa positive  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   Hypoxia   Alcoholic cirrhosis of liver (HCC)   Esophageal varices without bleeding (HCC)   Gastro-esophageal reflux disease without esophagitis   Hypothyroidism, unspecified   Hypertension, essential, benign   Thrombocytopenia (HCC)   Right knee pain  MSSA bacteremia Tmax 100.8 noted 5/23, to 103 this morning. Mssa on blood culture ID. CXR With concerns for pna but clinically no symptoms. Likely source if cellulitis left antecubital fossa from IV - ID consulted - continue cefazolin per pharm - TTE ordered - given recent l5s1 csi, will order MRI of lumbar spine to eval for infectious complication  Cellulitis Induration left antecubital fossa with erythema, small amount of purulence expressed at bedside today,  no clear abscess. The likely culprit. PIV has been removed - cefazolin as above - monitor  Back pain Status post [facility] 5/16 Initially back pain improved, now slightly worse on 5/18 No neurosurgical intervention warranted On Medrol Dosepak per neurosurgery recommendations Reengaged neurosurgery for comment on 5/21 Switch to tizanidine on 5/21 with good result Plan: Continue following pain management.  Gabapentin 400 mg 3 times daily, Zanaflex 4 mg 3 times daily, Valium 5 mg twice daily, Lidoderm patch.  Physical therapy as tolerated.  Patient not a surgical candidate at this time.  Will need several weeks of medical therapy, physical therapy and frequent evaluations.  Can consider neurosurgical decompression in 4 to 6 weeks if pain remains uncontrolled.  Pain control is been quite challenging. Per TOC has bed offer and insurance auth, she will check today if still there.    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   Alcoholic cirrhosis of liver (HCC) Pt no longer drinks. F/u with [facility]  Imaging in past showed cholecystitis His LFTs are within normal limit and total bilirubin was high but is improving.   It is possible he could have passed gallstone He is not coagulopathic thrombocytopenia makes him a dangerous surgical candidate   Esophageal varices without bleeding (HCC) dvt prophylaxis with heparin/Lovenox held due to low platelets.  Scd's. P.o. PPI   Gastro-esophageal reflux disease without esophagitis Continue Protonix   Hypothyroidism, unspecified Continue levothyroxine.    Hypertension, essential, benign  continue Coreg    Thrombocytopenia (HCC) Monitor platelet counts.  This  is likely due to alcoholism Avoid Lovenox   Right knee pain Negative x-ray for any acute pathology.  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   This is likely from referred pain from the back.  Appreciate Ortho input No  surgical intervention warranted Multimodal pain regimen as above   DVT prophylaxis: SCD Code Status: Full Family Communication: Sister updated @ bedside 5/24 Disposition Plan: Status is: Inpatient Remains inpatient appropriate because: w/u and treatment of bacteremia; snf placement  Level of care: Med-Surg  Consultants:  Neurosurgery, ID  Procedures:  Epidural spinal injection 5/16  Antimicrobials: None   Subjective: Patient seen and examined.  Low back pain persists. Tolerating diet  Objective: Vitals:   06/24/21 0315 06/24/21 0818 06/24/21 0830 06/24/21 1004  BP: 134/68 (!) 156/87  (!) 142/72  Pulse: 88 95  (!) 107  Resp: '18 18  18  '$ Temp: (!) 100.4 F (38 C) (!) 103.2 F (39.6 C) (!) 102.1 F (38.9 C) 100 F (37.8 C)  TempSrc: Oral Oral  Oral  SpO2: 97% 96%  97%  Weight:      Height:        Intake/Output Summary (Last 24 hours) at 06/24/2021 1046 Last data filed at 06/24/2021 1045 Gross per 24 hour  Intake 283 ml  Output 800 ml  Net -517 ml   Filed Weights   06/19/21 0500 06/20/21 0600 06/22/21 0500  Weight: 119.2 kg 122.2 kg 123.5 kg    Examination:  General exam: NAD.  Appears uncomfortable Respiratory system: Left-sided crackles.  Normal work of breathing.  Room air Cardiovascular system: S1-S2, RRR, no murmurs, no pedal edema Gastrointestinal system: Soft, NT/ND, normal bowel sounds Central nervous system: Alert and oriented. No focal neurological deficits. Extremities: Decreased power BLE.  Decreased range of motion skin: No rashes, lesions or ulcers Psychiatry: Judgement and insight appear normal. Mood & affect appropriate.  Msk: no spinous process tenderness    Data Reviewed: I have personally reviewed following labs and imaging studies  CBC: Recent Labs  Lab 06/19/21 0941 06/21/21 1124 06/23/21 0928  WBC 6.9 4.4 4.3  NEUTROABS 5.7 3.3 3.3  HGB 14.9 13.6 13.8  HCT 45.5 41.8 42.1  MCV 95.0 95.2 94.0  PLT 62* 58* 46*   Basic  Metabolic Panel: Recent Labs  Lab 06/19/21 0941 06/21/21 1124 06/23/21 0928  NA 136 138 132*  K 4.0 4.8 4.2  CL 104 107 103  CO2 '27 28 22  '$ GLUCOSE 158* 144* 117*  BUN 32* 19 16  CREATININE 1.44* 1.04 1.06  CALCIUM 8.9 8.8* 9.1   GFR: Estimated Creatinine Clearance: 98.1 mL/min (by C-G formula based on SCr of 1.06 mg/dL). Liver Function Tests: No results for input(s): AST, ALT, ALKPHOS, BILITOT, PROT, ALBUMIN in the last 168 hours.  No results for input(s): LIPASE, AMYLASE in the last 168 hours.  No results for input(s): AMMONIA in the last 168 hours. Coagulation Profile: Recent Labs  Lab 06/19/21 0941 06/21/21 1124  INR 1.3* 1.3*   Cardiac Enzymes: No results for input(s): CKTOTAL, CKMB, CKMBINDEX, TROPONINI in the last 168 hours. BNP (last 3 results) No results for input(s): PROBNP in the last 8760 hours. HbA1C: No results for input(s): HGBA1C in the last 72 hours. CBG: No results for input(s): GLUCAP in the last 168 hours. Lipid Profile: No results for input(s): CHOL, HDL, LDLCALC, TRIG, CHOLHDL, LDLDIRECT in the last 72 hours. Thyroid Function Tests: No results for input(s): TSH, T4TOTAL, FREET4, T3FREE, THYROIDAB in the last 72 hours. Anemia Panel: No  results for input(s): VITAMINB12, FOLATE, FERRITIN, TIBC, IRON, RETICCTPCT in the last 72 hours. Sepsis Labs: Recent Labs  Lab 06/23/21 0928  PROCALCITON 0.15    Recent Results (from the past 240 hour(s))  Culture, blood (Routine X 2) w Reflex to ID Panel     Status: None (Preliminary result)   Collection Time: 06/23/21  9:28 AM   Specimen: BLOOD  Result Value Ref Range Status   Specimen Description   Final    BLOOD  LEFT HAND Performed at [facility], [address], [place], [place] [address]    Special Requests   Final    BOTTLES DRAWN AEROBIC AND ANAEROBIC Blood Culture adequate volume Performed at [facility], [address] [place], [place] [address]    Culture  Setup Time    Final    Organism ID to follow [place] AEROBIC AND ANAEROBIC BOTTLES CRITICAL RESULT CALLED TO, READ BACK BY AND VERIFIED WITH: JASON ROBBINS @ 0011 ON 06/24/2021.Marland KitchenMarland KitchenTKR GRAM STAIN REVIEWED-AGREE WITH RESULT Performed at [facility], [place] [address] [place], [place] [address]    Culture GRAM POSITIVE COCCI  Final   Report Status PENDING  Incomplete  Blood Culture ID Panel (Reflexed)     Status: Abnormal   Collection Time: 06/23/21  9:28 AM  Result Value Ref Range Status   Enterococcus faecalis NOT DETECTED NOT DETECTED Final   Enterococcus Faecium NOT DETECTED NOT DETECTED Final   Listeria monocytogenes NOT DETECTED NOT DETECTED Final   Staphylococcus species DETECTED (A) NOT DETECTED Final    Comment: CRITICAL RESULT CALLED TO, READ BACK BY AND VERIFIED WITH: JASON ROBBINS @ 0011 ON 06/24/2021.Marland KitchenMarland KitchenTKR    Staphylococcus aureus (BCID) DETECTED (A) NOT DETECTED Final    Comment: CRITICAL RESULT CALLED TO, READ BACK BY AND VERIFIED WITH: JASON ROBBINS @ 0011 ON 06/24/2021.Marland KitchenMarland KitchenTKR    Staphylococcus epidermidis NOT DETECTED NOT DETECTED Final   Staphylococcus lugdunensis NOT DETECTED NOT DETECTED Final   Streptococcus species NOT DETECTED NOT DETECTED Final   Streptococcus agalactiae NOT DETECTED NOT DETECTED Final   Streptococcus pneumoniae NOT DETECTED NOT DETECTED Final   Streptococcus pyogenes NOT DETECTED NOT DETECTED Final   A.calcoaceticus-baumannii NOT DETECTED NOT DETECTED Final   Bacteroides fragilis NOT DETECTED NOT DETECTED Final   Enterobacterales NOT DETECTED NOT DETECTED Final   Enterobacter cloacae complex NOT DETECTED NOT DETECTED Final   Escherichia coli NOT DETECTED NOT DETECTED Final   Klebsiella aerogenes NOT DETECTED NOT DETECTED Final   Klebsiella oxytoca NOT DETECTED NOT DETECTED Final   Klebsiella pneumoniae NOT DETECTED NOT DETECTED Final   Proteus species NOT DETECTED NOT DETECTED Final   Salmonella species NOT DETECTED NOT DETECTED  Final   Serratia marcescens NOT DETECTED NOT DETECTED Final   Haemophilus influenzae NOT DETECTED NOT DETECTED Final   Neisseria meningitidis NOT DETECTED NOT DETECTED Final   Pseudomonas aeruginosa NOT DETECTED NOT DETECTED Final   Stenotrophomonas maltophilia NOT DETECTED NOT DETECTED Final   Candida albicans NOT DETECTED NOT DETECTED Final   Candida auris NOT DETECTED NOT DETECTED Final   Candida glabrata NOT DETECTED NOT DETECTED Final   Candida krusei NOT DETECTED NOT DETECTED Final   Candida parapsilosis NOT DETECTED NOT DETECTED Final   Candida tropicalis NOT DETECTED NOT DETECTED Final   Cryptococcus neoformans/gattii NOT DETECTED NOT DETECTED Final   Meth resistant mecA/C and MREJ NOT DETECTED NOT DETECTED Final    Comment: Performed at [facility], [address] [place], [place]  [address]  Culture, blood (Routine X 2) w Reflex to ID Panel     Status: None (Preliminary result)   Collection Time: 06/23/21  9:34 AM   Specimen: BLOOD  Result Value Ref Range Status   Specimen Description   Final    BLOOD RIGHT HAND Performed at [facility], [address], [place], [place] [address]    Special Requests   Final    BOTTLES DRAWN AEROBIC AND ANAEROBIC Blood Culture adequate volume Performed at [facility], [address], [place], [place] [address]    Culture  Setup Time   Final    GRAM POSITIVE COCCI AEROBIC BOTTLE ONLY CRITICAL VALUE NOTED.  VALUE IS CONSISTENT WITH PREVIOUSLY REPORTED AND CALLED VALUE. GRAM STAIN REVIEWED-AGREE WITH RESULT Performed at [facility], [place] [address], [place],  [address]    Culture GRAM POSITIVE COCCI  Final   Report Status PENDING  Incomplete         Radiology Studies: US Venous Img Upper Uni Left (DVT)  Result Date: 06/24/2021 CLINICAL DATA:  Erythema and swelling near peripheral IV site for the past week. Evaluate for DVT. EXAM: LEFT UPPER EXTREMITY VENOUS DOPPLER ULTRASOUND TECHNIQUE:  Gray-scale sonography with graded compression, as well as color Doppler and duplex ultrasound were performed to evaluate the upper extremity deep venous system from the level of the subclavian vein and including the jugular, axillary, basilic, radial, ulnar and upper cephalic vein. Spectral Doppler was utilized to evaluate flow at rest and with distal augmentation maneuvers. COMPARISON:  None Available. FINDINGS: Contralateral Subclavian Vein: Respiratory phasicity is normal and symmetric with the symptomatic side. No evidence of thrombus. Normal compressibility. Internal Jugular Vein: No evidence of thrombus. Normal compressibility, respiratory phasicity and response to augmentation. Subclavian Vein: No evidence of thrombus. Normal compressibility, respiratory phasicity and response to augmentation. Axillary Vein: No evidence of thrombus. Normal compressibility, respiratory phasicity and response to augmentation. Cephalic Vein: While the proximal aspect of the left cephalic vein appears widely patent (images 13 and 14), there is mixed echogenic occlusive thrombus involving the distal aspect of the left cephalic vein at the level of the antecubital fossa, reportedly at the location of reported peripheral IV (images 15, 18 and 19). Basilic Vein: No evidence of thrombus. Normal compressibility, respiratory phasicity and response to augmentation. Brachial Veins: No evidence of thrombus. Normal compressibility, respiratory phasicity and response to augmentation. Radial Veins: No evidence of thrombus. Normal compressibility, respiratory phasicity and response to augmentation. Ulnar Veins: No evidence of thrombus. Normal compressibility, respiratory phasicity and response to augmentation. Other Findings:  None visualized. IMPRESSION: 1. No evidence of DVT within the left upper extremity. 2. Examination is positive for short-segment occlusive superficial thrombophlebitis involving the cephalic vein at the level of the  antecubital fossa, reportedly at the location of prior peripheral IV. Electronically Signed   By: Sandi Mariscal M.D.   On: 06/24/2021 07:54   DG Chest Port 1 View  Result Date: 06/23/2021 CLINICAL DATA:  Fever.  Possible pneumonia. EXAM: PORTABLE CHEST 1 VIEW COMPARISON:  Chest x-ray December 14, 2010. FINDINGS: Mild lateral left basilar airspace opacity. No visible pleural effusions or pneumothorax. Similar cardiomediastinal silhouette, probably accentuated by AP technique. No displaced fracture. IMPRESSION: Mild lateral left basilar airspace opacity, potentially pneumonia. Electronically Signed   By: Margaretha Sheffield M.D.   On: 06/23/2021 10:29        Scheduled Meds:  amitriptyline  25 mg Oral QHS   carvedilol  6.25 mg Oral BID  WC   diazepam  5 mg Oral BID   fluticasone  2 spray Each Nare Daily   gabapentin  600 mg Oral TID   levothyroxine  100 mcg Oral QAC breakfast   lidocaine  1 patch Transdermal Q24H   pantoprazole  40 mg Oral Daily   sodium chloride flush  3 mL Intravenous Q12H   thiamine  100 mg Oral Daily   tiZANidine  4 mg Oral TID   Continuous Infusions:   ceFAZolin (ANCEF) IV Stopped (06/24/21 0636)     LOS: 11 days       Desma Maxim, MD [facility]   If 7PM-7AM, please contact night-coverage  06/24/2021, 10:46 AM

## 2021-06-24 NOTE — Consult Note (Signed)
NAME: Shaun Weaver  DOB: 1960-08-11  MRN: [id number]  Date/Time: 06/24/2021 11:46 AM  REQUESTING PROVIDER: Dr.Wouk Subjective:  REASON FOR CONSULT: MSSA bacteremia ? Shaun Weaver is a 61 y.o. 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 Vitals normal on admission Labs revealed plt of 75, Total bili of 2.5.  Creatinine was 1.52.  MRI of the lumbar spine revealed multilevel degenerative changes significant at L5-S1.  Severe right-sided subarticular stenosis at L5-S1 with impingement of the descending right S1 nerve root and moderate bilateral subarticular stenosis at L4-L5 with abutment of the descending L5 nerve roots.  He was seen by neurosurgery but because of thrombocytopenia and abnormal LFTs secondary to cirrhosis no surgical intervention was deemed possible.  On 06/16/2021 he underwent successful epidural injection on the right L4 and left S1 with Depo-Medrol mixed with 2 mL of 1% lidocaine.  He developed fever on 06/22/2021 and on 523 it was Tmax of 103.2.  He was started on IV antibiotics after blood cultures were sent.  He was put on IV ceftriaxone and azithromycin for her left basilar airspace opacity As blood culture positive for MSSA I am seeing the patient and the antibiotic has been changed to cefazolin  Past Medical History:  Diagnosis Date   Anxiety    Arthritis    Back pain    Cirrhosis, alcoholic ([place])    Coronary artery disease    Deep vein thrombosis of portal vein 2014   [place]   Environmental allergies    GERD (gastroesophageal reflux disease)    H/O alcohol abuse    Hypertension    Hypothyroidism    Thyroid disease     Past Surgical History:  Procedure Laterality  Date   ANKLE FRACTURE SURGERY Right    EYE SURGERY Right    Cataract Extraction with lens implant   FRACTURE SURGERY Bilateral    pinning right ankle   IR INJECT/THERA/INC NEEDLE/CATH/PLC EPI/LUMB/SAC W/IMG  06/16/2021   KNEE ARTHROSCOPY Left 02/26/2015   Procedure: ARTHROSCOPY Left  KNEE partial medial menisectomy;  Surgeon: Leanor Kail, MD;  Location: [facility] ORS;  Service: Orthopedics;  Laterality: Left;   KNEE ARTHROSCOPY Right 03/31/2015   Procedure: ARTHROSCOPY KNEE;  Surgeon: Leanor Kail, MD;  Location: [facility] OR[facility];  Service: Orthopedics;  Laterality: Right;    Social History   Socioeconomic History   Marital status: Married    Spouse name: Not on file   Number of children: Not on file   Years of education: Not on file   Highest education level: Not on file  Occupational History   Not on file  Tobacco Use   Smoking status: Never   Smokeless tobacco: Never  Vaping Use   Vaping Use: Never used  Substance and Sexual Activity   Alcohol use: No   Drug use: No   Sexual activity: Not on file  Other Topics Concern   Not on file  Social History Narrative   Not on file   Social Determinants of Health   Financial Resource Strain: Not on file  Food Insecurity: Not on file  Transportation Needs: Not on file  Physical Activity: Not on file  Stress: Not on  file  Social Connections: Not on file  Intimate Partner Violence: Not on file    History reviewed. No pertinent family history. No Known Allergies I? Current Facility-Administered Medications  Medication Dose Route Frequency Provider Last Rate Last Admin   acetaminophen (TYLENOL) tablet 650 mg  650 mg Oral Q6H PRN Max Sane, MD   650 mg at 06/24/21 0835   amitriptyline (ELAVIL) tablet 25 mg  25 mg Oral QHS Manuella Ghazi, Vipul, MD   25 mg at 06/22/21 2251   carvedilol (COREG) tablet 6.25 mg  6.25 mg Oral BID WC Manuella Ghazi, Vipul, MD   6.25 mg at 06/24/21 0835   ceFAZolin (ANCEF) IVPB 2g/100 mL premix  2 g Intravenous Q8H Foust, Katy  L, NP   Stopped at 06/24/21 0636   diazepam (VALIUM) tablet 5 mg  5 mg Oral BID Ralene Muskrat B, MD   5 mg at 06/24/21 0836   fluticasone (FLONASE) 50 MCG/ACT nasal spray 2 spray  2 spray Each Nare Daily Para Skeans, MD   2 spray at 06/24/21 6213   gabapentin (NEURONTIN) tablet 600 mg  600 mg Oral TID Ralene Muskrat B, MD   600 mg at 06/24/21 0836   HYDROcodone-acetaminophen (NORCO/VICODIN) 5-325 MG per tablet 1 tablet  1 tablet Oral Q4H PRN Max Sane, MD   1 tablet at 06/20/21 1812   ipratropium-albuterol (DUONEB) 0.5-2.5 (3) MG/3ML nebulizer solution 3 mL  3 mL Nebulization Q4H PRN Ralene Muskrat B, MD       levothyroxine (SYNTHROID) tablet 100 mcg  100 mcg Oral QAC breakfast Florina Ou V, MD   100 mcg at 06/24/21 0601   lidocaine (LIDODERM) 5 % 1 patch  1 patch Transdermal Q24H Max Sane, MD   1 patch at 06/23/21 1330   pantoprazole (PROTONIX) EC tablet 40 mg  40 mg Oral Daily Dorothe Pea, RPH   40 mg at 06/24/21 0836   sodium chloride flush (NS) 0.9 % injection 3 mL  3 mL Intravenous Q12H Florina Ou V, MD   3 mL at 06/24/21 0837   thiamine tablet 100 mg  100 mg Oral Daily Benita Gutter, RPH   100 mg at 06/24/21 0865   tiZANidine (ZANAFLEX) tablet 4 mg  4 mg Oral TID Ralene Muskrat B, MD   4 mg at 06/24/21 1005   traZODone (DESYREL) tablet 50 mg  50 mg Oral QHS PRN Para Skeans, MD   50 mg at 06/21/21 2037     Abtx:  Anti-infectives (From admission, onward)    Start     Dose/Rate Route Frequency Ordered Stop   06/24/21 0600  ceFAZolin (ANCEF) IVPB 2g/100 mL premix        2 g 200 mL/hr over 30 Minutes Intravenous Every 8 hours 06/24/21 0038     06/23/21 1330  cefTRIAXone (ROCEPHIN) 2 g in sodium chloride 0.9 % 100 mL IVPB  Status:  Discontinued        2 g 200 mL/hr over 30 Minutes Intravenous Every 24 hours 06/23/21 1234 06/24/21 0037   06/23/21 1330  azithromycin (ZITHROMAX) 500 mg in sodium chloride 0.9 % 250 mL IVPB  Status:  Discontinued        500  mg 250 mL/hr over 60 Minutes Intravenous Every 24 hours 06/23/21 1234 06/24/21 0036       REVIEW OF SYSTEMS:  Const: fever, chills, negative weight loss Eyes: negative diplopia or visual changes, negative eye pain ENT: negative coryza, negative sore throat Resp: negative cough,  hemoptysis, some dyspnea Cards: negative for chest pain, palpitations, lower extremity edema GU: negative for frequency, dysuria and hematuria GI: Negative for abdominal pain, diarrhea, bleeding, constipation Skin: negative for rash and pruritus Heme: negative for easy bruising and gum/nose bleeding MS: pain knees and ankles Neurolo:negative for headaches, dizziness, vertigo, memory problems  Psych:  anxiety, depression  Endocrine: negative for thyroid, diabetes Allergy/Immunology- negative for any medication or food allergies ? Pertinent Positives include : Objective:  VITALS:  BP (!) 142/72 (BP Location: Right Arm)   Pulse (!) 107   Temp 100 F (37.8 C) (Oral)   Resp 18   Ht 6' (1.829 m)   Wt 123.5 kg   SpO2 97%   BMI 36.93 kg/m  LDA Foley Central line Other drainage tubes PHYSICAL EXAM:  General: Alert, cooperative, no distress, appears stated age.  Head: Normocephalic, without obvious abnormality, atraumatic. Eyes: Conjunctivae clear, anicteric sclerae. Pupils are equal ENT Nares normal. No drainage or sinus tenderness. Lips, mucosa, and tongue normal. No Thrush Neck: Supple, symmetrical, no adenopathy, thyroid: non tender no carotid bruit and no JVD. Back: No CVA tenderness. Lungs: Clear to auscultation bilaterally. No Wheezing or Rhonchi. No rales. Heart: Regular rate and rhythm, no murmur, rub or gallop. Abdomen: Soft, non-tender,not distended. Bowel sounds normal. No masses Extremities: left cubital fossa erythematous and tender iv site  Skin:  bruising Lymph: Cervical, supraclavicular normal. Neurologic: Grossly non-focal Pertinent Labs Lab Results CBC    Component Value  Date/Time   WBC 4.3 06/23/2021 0928   RBC 4.48 06/23/2021 0928   HGB 13.8 06/23/2021 0928   HCT 42.1 06/23/2021 0928   PLT 46 (L) 06/23/2021 0928   MCV 94.0 06/23/2021 0928   MCH 30.8 06/23/2021 0928   MCHC 32.8 06/23/2021 0928   RDW 15.4 06/23/2021 0928   LYMPHSABS 0.5 (L) 06/23/2021 0928   MONOABS 0.4 06/23/2021 0928   EOSABS 0.0 06/23/2021 0928   BASOSABS 0.0 06/23/2021 0928       Latest Ref Rng & Units 06/23/2021    9:28 AM 06/21/2021   11:24 AM 06/19/2021    9:41 AM  CMP  Glucose 70 - 99 mg/dL 117   144   158    BUN 8 - 23 mg/dL 16   19   32    Creatinine 0.61 - 1.24 mg/dL 1.06   1.04   1.44    Sodium 135 - 145 mmol/L 132   138   136    Potassium 3.5 - 5.1 mmol/L 4.2   4.8   4.0    Chloride 98 - 111 mmol/L 103   107   104    CO2 22 - 32 mmol/L '22   28   27    '$ Calcium 8.9 - 10.3 mg/dL 9.1   8.8   8.9        Microbiology: Recent Results (from the past 240 hour(s))  Culture, blood (Routine X 2) w Reflex to ID Panel     Status: None (Preliminary result)   Collection Time: 06/23/21  9:28 AM   Specimen: BLOOD  Result Value Ref Range Status   Specimen Description   Final    BLOOD  LEFT HAND Performed at [facility], [address] [place], [place] [address]    Special Requests   Final    BOTTLES DRAWN AEROBIC AND ANAEROBIC Blood Culture adequate volume Performed at [facility], [address], [place],  [address]    Culture  Setup Time  Final    Organism ID to follow GRAM POSITIVE COCCI IN BOTH AEROBIC AND ANAEROBIC BOTTLES CRITICAL RESULT CALLED TO, READ BACK BY AND VERIFIED WITH: JASON ROBBINS @ 0011 ON 06/24/2021.Marland KitchenMarland KitchenTKR GRAM STAIN REVIEWED-AGREE WITH RESULT Performed at [facility], [place] [address], [place], [place] [address]    Culture GRAM POSITIVE COCCI  Final   Report Status PENDING  Incomplete  Blood Culture ID Panel (Reflexed)     Status: Abnormal   Collection Time: 06/23/21  9:28 AM  Result Value Ref Range Status    Enterococcus faecalis NOT DETECTED NOT DETECTED Final   Enterococcus Faecium NOT DETECTED NOT DETECTED Final   Listeria monocytogenes NOT DETECTED NOT DETECTED Final   Staphylococcus species DETECTED (A) NOT DETECTED Final    Comment: CRITICAL RESULT CALLED TO, READ BACK BY AND VERIFIED WITH: JASON ROBBINS @ 0011 ON 06/24/2021.Marland KitchenMarland KitchenTKR    Staphylococcus aureus (BCID) DETECTED (A) NOT DETECTED Final    Comment: CRITICAL RESULT CALLED TO, READ BACK BY AND VERIFIED WITH: JASON ROBBINS @ 0011 ON 06/24/2021.Marland KitchenMarland KitchenTKR    Staphylococcus epidermidis NOT DETECTED NOT DETECTED Final   Staphylococcus lugdunensis NOT DETECTED NOT DETECTED Final   Streptococcus species NOT DETECTED NOT DETECTED Final   Streptococcus agalactiae NOT DETECTED NOT DETECTED Final   Streptococcus pneumoniae NOT DETECTED NOT DETECTED Final   Streptococcus pyogenes NOT DETECTED NOT DETECTED Final   A.calcoaceticus-baumannii NOT DETECTED NOT DETECTED Final   Bacteroides fragilis NOT DETECTED NOT DETECTED Final   Enterobacterales NOT DETECTED NOT DETECTED Final   Enterobacter cloacae complex NOT DETECTED NOT DETECTED Final   Escherichia coli NOT DETECTED NOT DETECTED Final   Klebsiella aerogenes NOT DETECTED NOT DETECTED Final   Klebsiella oxytoca NOT DETECTED NOT DETECTED Final   Klebsiella pneumoniae NOT DETECTED NOT DETECTED Final   Proteus species NOT DETECTED NOT DETECTED Final   Salmonella species NOT DETECTED NOT DETECTED Final   Serratia marcescens NOT DETECTED NOT DETECTED Final   Haemophilus influenzae NOT DETECTED NOT DETECTED Final   Neisseria meningitidis NOT DETECTED NOT DETECTED Final   Pseudomonas aeruginosa NOT DETECTED NOT DETECTED Final   Stenotrophomonas maltophilia NOT DETECTED NOT DETECTED Final   Candida albicans NOT DETECTED NOT DETECTED Final   Candida auris NOT DETECTED NOT DETECTED Final   Candida glabrata NOT DETECTED NOT DETECTED Final   Candida krusei NOT DETECTED NOT DETECTED Final   Candida  parapsilosis NOT DETECTED NOT DETECTED Final   Candida tropicalis NOT DETECTED NOT DETECTED Final   Cryptococcus neoformans/gattii NOT DETECTED NOT DETECTED Final   Meth resistant mecA/C and MREJ NOT DETECTED NOT DETECTED Final    Comment: Performed at [facility], [address] [place], [place] [address]  Culture, blood (Routine X 2) w Reflex to ID Panel     Status: None (Preliminary result)   Collection Time: 06/23/21  9:34 AM   Specimen: BLOOD  Result Value Ref Range Status   Specimen Description   Final    BLOOD RIGHT HAND Performed at [facility], [address], [place], [place] [address]    Special Requests   Final    BOTTLES DRAWN AEROBIC AND ANAEROBIC Blood Culture adequate volume Performed at [facility], [address] [place], [place] [address]    Culture  Setup Time   Final    GRAM POSITIVE COCCI AEROBIC BOTTLE ONLY CRITICAL VALUE NOTED.  VALUE IS CONSISTENT WITH PREVIOUSLY REPORTED AND CALLED VALUE. GRAM STAIN REVIEWED-AGREE WITH RESULT Performed at [facility], [address] [place], [place] [address]    Culture GRAM POSITIVE COCCI  Final   Report Status PENDING  Incomplete    IMAGING RESULTS:  I have personally reviewed the films ?left basilar infiltrate vs atelectasis  Impression/Recommendation  ?MSSA bacteremia-   He had a recent procedure on 5/16- ESI Need to repeat MRI to look for any abscess Continue IV cefazolin which has been started in place of ceftriaxone and azithromycin As blood culture was not done on admission it is difficult to say whether it was POA or nosocomial- but because he never had fever on admission and also MRI on admission did not show any evidence of lumbar infection it is likely related to the phlebitis of the peripheral IV site. Also need to r/o lumbar epidural infection due to ESI  Back pain and rt sciatica    Cirrhosis liver with splenomegaly H/o portal vein thrombosis H/O  esophageal varices ? ? ___________________________________________________ Discussed with patient, requesting provider Note:  This document was prepared using Dragon voice recognition software and may include unintentional dictation errors.

## 2021-06-24 NOTE — Plan of Care (Signed)
  Problem: Pain Managment: Goal: General experience of comfort will improve Outcome: Progressing   

## 2021-06-24 NOTE — Progress Notes (Signed)
PHARMACY - PHYSICIAN COMMUNICATION CRITICAL VALUE ALERT - BLOOD CULTURE IDENTIFICATION (BCID)  Shaun Weaver is an 61 y.o. male who presented to [facility] on 06/12/2021 with a chief complaint of  esophageal varices.   Assessment:  Staph aureus in 3 of 4 bottles, no resistance detected.  (include suspected source if known)  Name of physician (or Provider) Contacted:  Neomia Glass, NP   Current antibiotics: Ceftriaxone , Azithromycin   Changes to prescribed antibiotics recommended:  Will d/c current regimen and start on cefazolin 2 gm IV Q8H.   Results for orders placed or performed during the hospital encounter of 06/12/21  Blood Culture ID Panel (Reflexed) (Collected: 06/23/2021  9:28 AM)  Result Value Ref Range   Enterococcus faecalis NOT DETECTED NOT DETECTED   Enterococcus Faecium NOT DETECTED NOT DETECTED   Listeria monocytogenes NOT DETECTED NOT DETECTED   Staphylococcus species DETECTED (A) NOT DETECTED   Staphylococcus aureus (BCID) DETECTED (A) NOT DETECTED   Staphylococcus epidermidis NOT DETECTED NOT DETECTED   Staphylococcus lugdunensis NOT DETECTED NOT DETECTED   Streptococcus species NOT DETECTED NOT DETECTED   Streptococcus agalactiae NOT DETECTED NOT DETECTED   Streptococcus pneumoniae NOT DETECTED NOT DETECTED   Streptococcus pyogenes NOT DETECTED NOT DETECTED   A.calcoaceticus-baumannii NOT DETECTED NOT DETECTED   Bacteroides fragilis NOT DETECTED NOT DETECTED   Enterobacterales NOT DETECTED NOT DETECTED   Enterobacter cloacae complex NOT DETECTED NOT DETECTED   Escherichia coli NOT DETECTED NOT DETECTED   Klebsiella aerogenes NOT DETECTED NOT DETECTED   Klebsiella oxytoca NOT DETECTED NOT DETECTED   Klebsiella pneumoniae NOT DETECTED NOT DETECTED   Proteus species NOT DETECTED NOT DETECTED   Salmonella species NOT DETECTED NOT DETECTED   Serratia marcescens NOT DETECTED NOT DETECTED   Haemophilus influenzae NOT DETECTED NOT DETECTED   Neisseria meningitidis NOT  DETECTED NOT DETECTED   Pseudomonas aeruginosa NOT DETECTED NOT DETECTED   Stenotrophomonas maltophilia NOT DETECTED NOT DETECTED   Candida albicans NOT DETECTED NOT DETECTED   Candida auris NOT DETECTED NOT DETECTED   Candida glabrata NOT DETECTED NOT DETECTED   Candida krusei NOT DETECTED NOT DETECTED   Candida parapsilosis NOT DETECTED NOT DETECTED   Candida tropicalis NOT DETECTED NOT DETECTED   Cryptococcus neoformans/gattii NOT DETECTED NOT DETECTED   Meth resistant mecA/C and MREJ NOT DETECTED NOT DETECTED    [name] 06/24/2021  12:38 AM

## 2021-06-25 ENCOUNTER — Inpatient Hospital Stay: Payer: BC Managed Care – PPO

## 2021-06-25 DIAGNOSIS — M5441 Lumbago with sciatica, right side: Secondary | ICD-10-CM | POA: Diagnosis not present

## 2021-06-25 DIAGNOSIS — G8929 Other chronic pain: Secondary | ICD-10-CM | POA: Diagnosis not present

## 2021-06-25 DIAGNOSIS — B9561 Methicillin susceptible Staphylococcus aureus infection as the cause of diseases classified elsewhere: Secondary | ICD-10-CM

## 2021-06-25 LAB — BASIC METABOLIC PANEL
Anion gap: 6 (ref 5–15)
BUN: 18 mg/dL (ref 8–23)
CO2: 23 mmol/L (ref 22–32)
Calcium: 8.4 mg/dL — ABNORMAL LOW (ref 8.9–10.3)
Chloride: 105 mmol/L (ref 98–111)
Creatinine, Ser: 1.1 mg/dL (ref 0.61–1.24)
GFR, Estimated: >60 mL/min (ref >60)
Glucose, Bld: 123 mg/dL — ABNORMAL HIGH (ref 70–99)
Potassium: 4.1 mmol/L (ref 3.5–5.1)
Sodium: 134 mmol/L — ABNORMAL LOW (ref 135–145)

## 2021-06-25 LAB — CBC
HCT: 38.5 % — ABNORMAL LOW (ref 39.0–52.0)
Hemoglobin: 12.7 g/dL — ABNORMAL LOW (ref 13.0–17.0)
MCH: 30.6 pg (ref 26.0–34.0)
MCHC: 33 g/dL (ref 30.0–36.0)
MCV: 92.8 fL (ref 80.0–100.0)
Platelets: 41 10*3/uL — ABNORMAL LOW (ref 150–400)
RBC: 4.15 MIL/uL — ABNORMAL LOW (ref 4.22–5.81)
RDW: 15.3 % (ref 11.5–15.5)
WBC: 3.6 10*3/uL — ABNORMAL LOW (ref 4.0–10.5)
nRBC: 0 % (ref 0.0–0.2)

## 2021-06-25 MED ORDER — GADOBUTROL 1 MMOL/ML IV SOLN
10.0000 mL | Freq: Once | INTRAVENOUS | Status: AC | PRN
  Administered 2021-06-25: 10 mL via INTRAVENOUS

## 2021-06-25 NOTE — Plan of Care (Signed)
  Problem: Education: Goal: Knowledge of General Education information will improve Description: Including pain rating scale, medication(s)/side effects and non-pharmacologic comfort measures Outcome: Progressing   Problem: Clinical Measurements: Goal: Respiratory complications will improve Outcome: Progressing Goal: Cardiovascular complication will be avoided Outcome: Progressing   Problem: Activity: Goal: Risk for activity intolerance will decrease Outcome: Progressing   Problem: Nutrition: Goal: Adequate nutrition will be maintained Outcome: Progressing   Problem: Coping: Goal: Level of anxiety will decrease Outcome: Progressing   Problem: Pain Managment: Goal: General experience of comfort will improve Outcome: Progressing   Problem: Safety: Goal: Ability to remain free from injury will improve Outcome: Progressing

## 2021-06-25 NOTE — Progress Notes (Signed)
Physical Therapy Treatment Patient Details Name: Shaun Weaver MRN: [id number] DOB: [birth year] Today's Date: 06/25/2021   History of Present Illness 61 year old male with alcoholic liver cirrhosis admitted for uncontrolled back pain and right sided sciatica    PT Comments    Pt is making good progress towards goals and reports his pain is only 7/10 with ambulation this date. Pt able to place partial WBing through R LE. Continues to be limited by pain. RW used with step to antalgic gait pattern. Assistance for bathroom activities. Agreeable to sit in recliner at end of session. Will continue to progress as able.   Recommendations for follow up therapy are one component of a multi-disciplinary discharge planning process, led by the attending physician.  Recommendations may be updated based on patient status, additional functional criteria and insurance authorization.  Follow Up Recommendations  Skilled nursing-short term rehab (<3 hours/day)     Assistance Recommended at Discharge Intermittent Supervision/Assistance  Patient can return home with the following A little help with walking and/or transfers;A little help with bathing/dressing/bathroom;Assistance with cooking/housework;Assist for transportation;Help with stairs or ramp for entrance   Equipment Recommendations  BSC/3in1;Rolling walker (2 wheels);Wheelchair cushion (measurements PT);Wheelchair (measurements PT)    Recommendations for Other Services       Precautions / Restrictions Precautions Precautions: Fall Restrictions Weight Bearing Restrictions: No     Mobility  Bed Mobility Overal bed mobility: Needs Assistance Bed Mobility: Supine to Sit     Supine to sit: Modified independent (Device/Increase time)     General bed mobility comments: wanted bed flat prior to transfer. Uses railing to assist with technique. Once seated, upright posture and reports his leg "smarted" at him    Transfers Overall transfer level:  Needs assistance Equipment used: Rolling walker (2 wheels) Transfers: Sit to/from Stand Sit to Stand: Min guard           General transfer comment: bed slighly elevated. RW used and pt able to stand with cues. ONce standing, no LOB or dizziness noted    Ambulation/Gait Ambulation/Gait assistance: Min guard Gait Distance (Feet): 40 Feet Assistive device: Rolling walker (2 wheels) Gait Pattern/deviations: Step-to pattern, Decreased step length - right, Decreased step length - left, Shuffle, Antalgic       General Gait Details: ambulated in/out of bathroom, and then around room. Effortful antalgic gait pattern with heavy WBing through B UEs. Pt demonstrates upright posture with ambulation   Stairs             Wheelchair Mobility    Modified Rankin (Stroke Patients Only)       Balance Overall balance assessment: Needs assistance Sitting-balance support: Feet supported Sitting balance-Leahy Scale: Good     Standing balance support: Bilateral upper extremity supported, During functional activity, Reliant on assistive device for balance Standing balance-Leahy Scale: Fair                              Cognition Arousal/Alertness: Awake/alert Behavior During Therapy: WFL for tasks assessed/performed Overall Cognitive Status: Within Functional Limits for tasks assessed                                 General Comments: alert and agreeable to session        Exercises Other Exercises Other Exercises: ambulated to bathroom. CGA for transfers on/off low toliet with use of grab bar. RW used. Pt needs  supervision with hygiene    General Comments        Pertinent Vitals/Pain Pain Assessment Pain Assessment: 0-10 Pain Score: 7  Pain Location: R LE Pain Descriptors / Indicators: Radiating Pain Intervention(s): Limited activity within patient's tolerance, Repositioned    Home Living                          Prior Function             PT Goals (current goals can now be found in the care plan section) Acute Rehab PT Goals Patient Stated Goal: Get my pain under control PT Goal Formulation: With patient Time For Goal Achievement: 06/27/21 Potential to Achieve Goals: Good Progress towards PT goals: Progressing toward goals    Frequency    Min 2X/week      PT Plan Current plan remains appropriate    Co-evaluation              AM-PAC PT "6 Clicks" Mobility   Outcome Measure  Help needed turning from your back to your side while in a flat bed without using bedrails?: None Help needed moving from lying on your back to sitting on the side of a flat bed without using bedrails?: None Help needed moving to and from a bed to a chair (including a wheelchair)?: A Little Help needed standing up from a chair using your arms (e.g., wheelchair or bedside chair)?: A Little Help needed to walk in hospital room?: A Little Help needed climbing 3-5 steps with a railing? : Total 6 Click Score: 18    End of Session Equipment Utilized During Treatment: Gait belt Activity Tolerance: Patient limited by pain Patient left: in chair;with call bell/phone within reach;with chair alarm set Nurse Communication: Mobility status PT Visit Diagnosis: Difficulty in walking, not elsewhere classified (R26.2);Pain;Muscle weakness (generalized) (M62.81) Pain - Right/Left: Right Pain - part of body: Leg     Time: 6294-7654 PT Time Calculation (min) (ACUTE ONLY): 32 min  Charges:  $Gait Training: 8-22 mins $Therapeutic Activity: 8-22 mins                     Greggory Stallion, PT, DPT, GCS [phone]    Shaun Weaver 06/25/2021, 11:57 AM

## 2021-06-25 NOTE — Plan of Care (Signed)
  Problem: Activity: Goal: Risk for activity intolerance will decrease 06/25/2021 2233 by Kirtland Bouchard, RN Outcome: Progressing 06/25/2021 2233 by Kirtland Bouchard, RN Outcome: Progressing   Problem: Elimination: Goal: Will not experience complications related to bowel motility 06/25/2021 2233 by Kirtland Bouchard, RN Outcome: Progressing 06/25/2021 2233 by Kirtland Bouchard, RN Outcome: Progressing Goal: Will not experience complications related to urinary retention 06/25/2021 2233 by Kirtland Bouchard, RN Outcome: Progressing 06/25/2021 2233 by Kirtland Bouchard, RN Outcome: Progressing   Problem: Safety: Goal: Ability to remain free from injury will improve 06/25/2021 2233 by Kirtland Bouchard, RN Outcome: Progressing 06/25/2021 2233 by Kirtland Bouchard, RN Outcome: Progressing   Problem: Pain Managment: Goal: General experience of comfort will improve 06/25/2021 2233 by Kirtland Bouchard, RN Outcome: Progressing 06/25/2021 2233 by Kirtland Bouchard, RN Outcome: Progressing

## 2021-06-25 NOTE — Progress Notes (Signed)
Patient off the floor to MRI 

## 2021-06-25 NOTE — Progress Notes (Signed)
ID MSSA bacteremia Source left cubital IV site MRI lumbar spine no evidence of infection Will not need TEE Continue Cefazolin- may need upto 4 weeks

## 2021-06-25 NOTE — Progress Notes (Signed)
PROGRESS NOTE    Shaun Weaver  OZD:[id number] DOB: [birth year] DOA: 06/12/2021 PCP: Sofie Hartigan, MD    Brief Narrative:  61 year old male with alcoholic liver cirrhosis admitted for uncontrolled back pain and right sided sciatica   5/13: Started Medrol Dosepak, Toradol 4 times daily.  PT, OT eval 5/14: PT, OT recommends SNF.  TOC aware 5/15: Right knee x-ray.  SNF search in progress 5/16: ESI planned under IR 5/17: Status post epidural spinal injection.  Back pain overall improved, but not yet back to baseline 5/18: Back pain persistent.  Most severe in the buttock and radiating down the leg 5/19.  Persistent back pain.  Not a good surgical candidate per nsg 5/21: Limited relief despite escalating doses of medications 5/22: Pain control seems to be improving though patient more lethargic today. 5/23: Pain control seems to be improving however patient appears somewhat more delirious today.  Endorsing some difficulty breathing.  Low-grade temperature noted 100.8.  Chest x-ray with evolving left infiltrate concerning for CAP. But blood cultures mssa positive  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   Hypoxia   Alcoholic cirrhosis of liver (HCC)   Esophageal varices without bleeding (HCC)   Gastro-esophageal reflux disease without esophagitis   Hypothyroidism, unspecified   Hypertension, essential, benign   Thrombocytopenia (HCC)   Right knee pain  MSSA bacteremia Tmax 100.8 noted 5/23, last fever AM 5/24. MSSA on blood culture ID. CXR With concerns for pna but clinically no symptoms. Likely source if cellulitis left antecubital fossa from IV. TTE no vegetations. Recent injection lumbar spine - MRI no signs infection. - ID consulted - continue cefazolin per pharm, sensitivities pending  Cellulitis Induration left antecubital fossa with erythema, small amount of purulence expressed at bedside 5/24, no clear  abscess. The likely culprit. PIV has been removed. Improving. - cefazolin as above - monitor  Back pain Status post [facility] 5/16 Initially back pain improved, now slightly worse on 5/18 No neurosurgical intervention warranted On Medrol Dosepak per neurosurgery recommendations Reengaged neurosurgery for comment on 5/21 Switch to tizanidine on 5/21 with good result Plan: Continue following pain management.  Gabapentin 400 mg 3 times daily, Zanaflex 4 mg 3 times daily, Valium 5 mg twice daily, Lidoderm patch.  Physical therapy as tolerated.  Patient not a surgical candidate at this time.  Will need several weeks of medical therapy, physical therapy and frequent evaluations.  Can consider neurosurgical decompression in 4 to 6 weeks if pain remains uncontrolled.  Pain control is been quite challenging. Per TOC has bed available   Sciatica of right side associated with disorder of lumbosacral spine As above   Hypoxia, resolved He is on room air now negative dVT.   Alcoholic cirrhosis of liver (HCC) Pt no longer drinks. F/u with [facility]  Imaging in past showed cholecystitis His LFTs are within normal limit and total bilirubin was high but is improving.   It is possible he could have passed gallstone He is not coagulopathic thrombocytopenia makes him a dangerous surgical candidate   Esophageal varices without bleeding (HCC) dvt prophylaxis with heparin/Lovenox held due to low platelets.  Scd's. P.o. PPI   Gastro-esophageal reflux disease without esophagitis Continue Protonix   Hypothyroidism, unspecified Continue levothyroxine.    Hypertension, essential, benign  continue Coreg    Thrombocytopenia (HCC) Monitor platelet counts.  This is likely due to alcoholism Avoid Lovenox   Right knee pain Negative x-ray for  any acute pathology.  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   This is likely from referred pain from the back.  Appreciate Ortho input No surgical intervention warranted Multimodal pain  regimen as above   DVT prophylaxis: SCDs Code Status: Full Family Communication: Sister updated telephonically 5/25 Disposition Plan: Status is: Inpatient Remains inpatient appropriate because: w/u and treatment of bacteremia; snf placement  Level of care: Med-Surg  Consultants:  Neurosurgery, ID  Procedures:  Epidural spinal injection 5/16  Antimicrobials: None   Subjective: Patient seen and examined.  Low back pain and right lateral thigh pain persists. Tolerating diet  Objective: Vitals:   06/24/21 1500 06/24/21 1949 06/25/21 0555 06/25/21 0800  BP: 117/64 127/66 130/76 120/66  Pulse: 84 80 68 66  Resp: '16 19 16 16  '$ Temp: 98.5 F (36.9 C) 98.6 F (37 C) 98 F (36.7 C) 98 F (36.7 C)  TempSrc: Oral Oral Oral Oral  SpO2: 96% 95% 96% 95%  Weight:      Height:        Intake/Output Summary (Last 24 hours) at 06/25/2021 0940 Last data filed at 06/25/2021 0912 Gross per 24 hour  Intake 1113.15 ml  Output 950 ml  Net 163.15 ml   Filed Weights   06/19/21 0500 06/20/21 0600 06/22/21 0500  Weight: 119.2 kg 122.2 kg 123.5 kg    Examination:  General exam: NAD.  Appears uncomfortable Respiratory system: Left-sided crackles.  Normal work of breathing.  Room air Cardiovascular system: S1-S2, RRR, no murmurs, no pedal edema Gastrointestinal system: Soft, NT/ND, normal bowel sounds Central nervous system: Alert and oriented. No focal neurological deficits. Extremities: Decreased power BLE.  Decreased range of motion skin: No rashes, lesions or ulcers Psychiatry: Judgement and insight appear normal. Mood & affect appropriate.  Msk: no spinous process tenderness    Data Reviewed: I have personally reviewed following labs and imaging studies  CBC: Recent Labs  Lab 06/19/21 0941 06/21/21 1124 06/23/21 0928 06/25/21 0533  WBC 6.9 4.4 4.3 3.6*  NEUTROABS 5.7 3.3 3.3  --   HGB 14.9 13.6 13.8 12.7*  HCT 45.5 41.8 42.1 38.5*  MCV 95.0 95.2 94.0 92.8  PLT 62*  58* 46* 41*   Basic Metabolic Panel: Recent Labs  Lab 06/19/21 0941 06/21/21 1124 06/23/21 0928 06/25/21 0533  NA 136 138 132* 134*  K 4.0 4.8 4.2 4.1  CL 104 107 103 105  CO2 '27 28 22 23  '$ GLUCOSE 158* 144* 117* 123*  BUN 32* '19 16 18  '$ CREATININE 1.44* 1.04 1.06 1.10  CALCIUM 8.9 8.8* 9.1 8.4*   GFR: Estimated Creatinine Clearance: 94.5 mL/min (by C-G formula based on SCr of 1.1 mg/dL). Liver Function Tests: No results for input(s): AST, ALT, ALKPHOS, BILITOT, PROT, ALBUMIN in the last 168 hours.  No results for input(s): LIPASE, AMYLASE in the last 168 hours.  No results for input(s): AMMONIA in the last 168 hours. Coagulation Profile: Recent Labs  Lab 06/19/21 0941 06/21/21 1124  INR 1.3* 1.3*   Cardiac Enzymes: No results for input(s): CKTOTAL, CKMB, CKMBINDEX, TROPONINI in the last 168 hours. BNP (last 3 results) No results for input(s): PROBNP in the last 8760 hours. HbA1C: No results for input(s): HGBA1C in the last 72 hours. CBG: No results for input(s): GLUCAP in the last 168 hours. Lipid Profile: No results for input(s): CHOL, HDL, LDLCALC, TRIG, CHOLHDL, LDLDIRECT in the last 72 hours. Thyroid Function Tests: No results for input(s): TSH, T4TOTAL, FREET4, T3FREE, THYROIDAB in the last 72 hours.  Anemia Panel: No results for input(s): VITAMINB12, FOLATE, FERRITIN, TIBC, IRON, RETICCTPCT in the last 72 hours. Sepsis Labs: Recent Labs  Lab 06/23/21 0928  PROCALCITON 0.15    Recent Results (from the past 240 hour(s))  Culture, blood (Routine X 2) w Reflex to ID Panel     Status: Abnormal (Preliminary result)   Collection Time: 06/23/21  9:28 AM   Specimen: BLOOD  Result Value Ref Range Status   Specimen Description   Final    BLOOD  LEFT HAND Performed at [facility], [address] [place], [place] [address]    Special Requests   Final    BOTTLES DRAWN AEROBIC AND ANAEROBIC Blood Culture adequate volume Performed at [facility], [address] [place], [place] [address]    Culture  Setup Time   Final    Organism ID to follow [place] AEROBIC AND ANAEROBIC BOTTLES CRITICAL RESULT CALLED TO, READ BACK BY AND VERIFIED WITH: JASON ROBBINS @ 0011 ON 06/24/2021.Mar[place] KitchenMarland KitchenTKR GRAM STAIN REVIEWED-AGREE WITH RESULT    Culture (A)  Final    STAPHYLOCOCCUS AUREUS SUSCEPTIBILITIES TO FOLLOW Performed at [facility], [place] [address] [place], [place] [address]    Report Status PENDING  Incomplete  Blood Culture ID Panel (Reflexed)     Status: Abnormal   Collection Time: 06/23/21  9:28 AM  Result Value Ref Range Status   Enterococcus faecalis NOT DETECTED NOT DETECTED Final   Enterococcus Faecium NOT DETECTED NOT DETECTED Final   Listeria monocytogenes NOT DETECTED NOT DETECTED Final   Staphylococcus species DETECTED (A) NOT DETECTED Final    Comment: CRITICAL RESULT CALLED TO, READ BACK BY AND VERIFIED WITH: JASON ROBBINS @ 0011 ON 06/24/2021.Marland KitchenMarland KitchenTKR    Staphylococcus aureus (BCID) DETECTED (A) NOT DETECTED Final    Comment: CRITICAL RESULT CALLED TO, READ BACK BY AND VERIFIED WITH: JASON ROBBINS @ 0011 ON 06/24/2021.[place] KitchenMarland KitchenTKR    Staphylococcus epidermidis NOT DETECTED NOT DETECTED Final   Staphylococcus lugdunensis NOT DETECTED NOT DETECTED Final   Streptococcus species NOT DETECTED NOT DETECTED Final   Streptococcus agalactiae NOT DETECTED NOT DETECTED Final   Streptococcus pneumoniae NOT DETECTED NOT DETECTED Final   Streptococcus pyogenes NOT DETECTED NOT DETECTED Final   A.calcoaceticus-baumannii NOT DETECTED NOT DETECTED Final   Bacteroides fragilis NOT DETECTED NOT DETECTED Final   Enterobacterales NOT DETECTED NOT DETECTED Final   Enterobacter cloacae complex NOT DETECTED NOT DETECTED Final   Escherichia coli NOT DETECTED NOT DETECTED Final   Klebsiella aerogenes NOT DETECTED NOT DETECTED Final   Klebsiella oxytoca NOT DETECTED NOT DETECTED Final   Klebsiella pneumoniae NOT DETECTED NOT  DETECTED Final   Proteus species NOT DETECTED NOT DETECTED Final   Salmonella species NOT DETECTED NOT DETECTED Final   Serratia marcescens NOT DETECTED NOT DETECTED Final   Haemophilus influenzae NOT DETECTED NOT DETECTED Final   Neisseria meningitidis NOT DETECTED NOT DETECTED Final   Pseudomonas aeruginosa NOT DETECTED NOT DETECTED Final   Stenotrophomonas maltophilia NOT DETECTED NOT DETECTED Final   Candida albicans NOT DETECTED NOT DETECTED Final   Candida auris NOT DETECTED NOT DETECTED Final   Candida glabrata NOT DETECTED NOT DETECTED Final   Candida krusei NOT DETECTED NOT DETECTED Final   Candida parapsilosis NOT DETECTED NOT DETECTED Final   Candida tropicalis NOT DETECTED NOT DETECTED Final   Cryptococcus neoformans/gattii NOT DETECTED NOT DETECTED Final   Meth resistant mecA/C and MREJ NOT DETECTED NOT DETECTED Final    Comment: Performed  at [facility], [address] [place], [place] [address]  Culture, blood (Routine X 2) w Reflex to ID Panel     Status: Abnormal (Preliminary result)   Collection Time: 06/23/21  9:34 AM   Specimen: BLOOD RIGHT HAND  Result Value Ref Range Status   Specimen Description   Final    BLOOD RIGHT HAND Performed at [facility], [place] [address] [place], [place] [address]    Special Requests   Final    BOTTLES DRAWN AEROBIC AND ANAEROBIC Blood Culture adequate volume Performed at [facility], [address] [place], [place] [address]    Culture  Setup Time   Final    GRAM POSITIVE COCCI AEROBIC BOTTLE ONLY CRITICAL VALUE NOTED.  VALUE IS CONSISTENT WITH PREVIOUSLY REPORTED AND CALLED VALUE. GRAM STAIN REVIEWED-AGREE WITH RESULT Performed at [facility], [place] [address], [place], [place] [address]    Culture STAPHYLOCOCCUS AUREUS (A)  Final   Report Status PENDING  Incomplete         Radiology Studies: MR Lumbar Spine W Wo Contrast  Result Date: 06/25/2021 CLINICAL DATA:  Bacteremia, recent  epidural injection EXAM: MRI LUMBAR SPINE WITHOUT AND WITH CONTRAST TECHNIQUE: Multiplanar and multiecho pulse sequences of the lumbar spine were obtained without and with intravenous contrast. CONTRAST:  75m GADAVIST GADOBUTROL 1 MMOL/ML IV SOLN COMPARISON:  06/12/2021 MRI lumbar spine FINDINGS: Segmentation:  Standard. Alignment:  Physiologic. Vertebrae: No acute fracture, evidence of discitis, or suspicious osseous lesion. No abnormal osseous enhancement. Unchanged appearance of the vertebral bodies compared to 06/12/2021. Conus medullaris and cauda equina: Conus extends to the T12 level. Conus and cauda equina appear normal. No abnormal enhancement. Paraspinal and other soft tissues: Bilateral renal atrophy and multiple small renal cysts. Disc levels: T12-L1: No significant disc bulge. No spinal canal stenosis or neural foraminal narrowing. L1-L2: No significant disc bulge. Mild facet arthropathy. No spinal canal stenosis or neural foraminal narrowing. L2-L3: No significant disc bulge. Mild facet arthropathy. No spinal canal stenosis or neural foraminal narrowing. L3-L4: No significant disc bulge. Mild facet arthropathy. No spinal canal stenosis or neural foraminal narrowing. L4-L5: No significant disc bulge. Moderate facet arthropathy. Narrowing of the lateral recesses. No spinal canal stenosis or neural foraminal narrowing. L5-S1: Disc height loss with right eccentric disc bulge. Mild facet arthropathy. Disc protrusion contacts the descending right S1 nerve roots. No spinal canal stenosis. Mild right neural foraminal narrowing. IMPRESSION: 1. No evidence of discitis osteomyelitis. No acute osseous abnormality. 2. L5-S1 disc protrusion that contacts the descending right S1 nerve roots. Mild right neural foraminal narrowing at this level. 3. Narrowing of the lateral recesses at L4-L5, which could affect the descending L5 nerve roots. Electronically Signed   By: AMerilyn BabaM.D.   On: 06/25/2021 02:20   [place] Venous Img Upper Uni Left (DVT)  Result Date: 06/24/2021 CLINICAL DATA:  Erythema and swelling near peripheral IV site for the past week. Evaluate for DVT. EXAM: LEFT UPPER EXTREMITY VENOUS DOPPLER ULTRASOUND TECHNIQUE: Gray-scale sonography with graded compression, as well as color Doppler and duplex ultrasound were performed to evaluate the upper extremity deep venous system from the level of the subclavian vein and including the jugular, axillary, basilic, radial, ulnar and upper cephalic vein. Spectral Doppler was utilized to evaluate flow at rest and with distal augmentation maneuvers. COMPARISON:  None Available. FINDINGS: Contralateral Subclavian Vein: Respiratory phasicity is normal and symmetric with the symptomatic side. No evidence of thrombus. Normal compressibility.  Internal Jugular Vein: No evidence of thrombus. Normal compressibility, respiratory phasicity and response to augmentation. Subclavian Vein: No evidence of thrombus. Normal compressibility, respiratory phasicity and response to augmentation. Axillary Vein: No evidence of thrombus. Normal compressibility, respiratory phasicity and response to augmentation. Cephalic Vein: While the proximal aspect of the left cephalic vein appears widely patent (images 13 and 14), there is mixed echogenic occlusive thrombus involving the distal aspect of the left cephalic vein at the level of the antecubital fossa, reportedly at the location of reported peripheral IV (images 15, 18 and 19). Basilic Vein: No evidence of thrombus. Normal compressibility, respiratory phasicity and response to augmentation. Brachial Veins: No evidence of thrombus. Normal compressibility, respiratory phasicity and response to augmentation. Radial Veins: No evidence of thrombus. Normal compressibility, respiratory phasicity and response to augmentation. Ulnar Veins: No evidence of thrombus. Normal compressibility, respiratory phasicity and response to augmentation. Other Findings:   None visualized. IMPRESSION: 1. No evidence of DVT within the left upper extremity. 2. Examination is positive for short-segment occlusive superficial thrombophlebitis involving the cephalic vein at the level of the antecubital fossa, reportedly at the location of prior peripheral IV. Electronically Signed   By: Sandi Mariscal M.D.   On: 06/24/2021 07:54   DG Chest Port 1 View  Result Date: 06/23/2021 CLINICAL DATA:  Fever.  Possible pneumonia. EXAM: PORTABLE CHEST 1 VIEW COMPARISON:  Chest x-ray December 14, 2010. FINDINGS: Mild lateral left basilar airspace opacity. No visible pleural effusions or pneumothorax. Similar cardiomediastinal silhouette, probably accentuated by AP technique. No displaced fracture. IMPRESSION: Mild lateral left basilar airspace opacity, potentially pneumonia. Electronically Signed   By: Margaretha [place] M.D.   On: 06/23/2021 10:29   ECHOCARDIOGRAM COMPLETE  Result Date: 06/24/2021    ECHOCARDIOGRAM REPORT   Patient Name:   KEEVEN MATTY Date of Exam: 06/24/2021 Medical Rec #:  [id number]    Height:       72.0 in Accession #:    [id number]   Weight:       272.3 lb Date of Birth:  02-Feb-1960     BSA:          2.429 m Patient Age:    74 years     BP:           138/70 mmHg Patient Gender: M            HR:           86 bpm. Exam Location:  [facility] Procedure: 2D Echo, Cardiac Doppler and Color Doppler Indications:     Bacteremia R78.81  History:         Patient has no prior history of Echocardiogram examinations.                  Risk Factors:Hypertension. DVT.  Sonographer:     Sherrie Sport Referring Phys:  [id number] [place] L FOUST Diagnosing Phys: Kate Sable MD  Sonographer Comments: Technically challenging study due to limited acoustic windows, suboptimal subcostal window and no apical window. IMPRESSIONS  1. Left ventricular ejection fraction, by estimation, is 60 to 65%. The left ventricle has normal function. The left ventricle has no regional wall motion abnormalities. There is mild  left ventricular hypertrophy. Left ventricular diastolic function could not be evaluated.  2. Right ventricular systolic function is normal. The right ventricular size is not well visualized.  3. The mitral valve is normal in structure. No evidence of mitral valve regurgitation.  4. The aortic valve is tricuspid. Aortic valve regurgitation is  not visualized. FINDINGS  Left Ventricle: Left ventricular ejection fraction, by estimation, is 60 to 65%. The left ventricle has normal function. The left ventricle has no regional wall motion abnormalities. The left ventricular internal cavity size was normal in size. There is  mild left ventricular hypertrophy. Left ventricular diastolic function could not be evaluated. Right Ventricle: The right ventricular size is not well visualized. No increase in right ventricular wall thickness. Right ventricular systolic function is normal. Left Atrium: Left atrial size was normal in size. Right Atrium: Right atrial size was not well visualized. Pericardium: There is no evidence of pericardial effusion. Mitral Valve: The mitral valve is normal in structure. No evidence of mitral valve regurgitation. Tricuspid Valve: The tricuspid valve is normal in structure. Tricuspid valve regurgitation is not demonstrated. Aortic Valve: The aortic valve is tricuspid. Aortic valve regurgitation is not visualized. Pulmonic Valve: The pulmonic valve was not well visualized. Pulmonic valve regurgitation is not visualized. Aorta: The aortic root is normal in size and structure. Venous: The inferior vena cava was not well visualized. IAS/Shunts: No atrial level shunt detected by color flow Doppler.  LEFT VENTRICLE PLAX 2D LVIDd:         5.10 cm LVIDs:         3.40 cm LV PW:         1.30 cm LV IVS:        1.40 cm  LEFT ATRIUM         Index LA diam:    4.30 cm 1.77 cm/m                        PULMONIC VALVE AORTA                 PV Vmax:        0.83 m/s Ao Root diam: 3.50 cm PV Vmean:       49.600 cm/s                        PV VTI:         0.142 m                       PV Peak grad:   2.7 mmHg                       PV Mean grad:   1.0 mmHg                       RVOT Peak grad: 4 mmHg   SHUNTS Pulmonic VTI: 0.182 m Kate Sable MD Electronically signed by Kate Sable MD Signature Date/Time: 06/24/2021/5:07:09 PM    Final         Scheduled Meds:  amitriptyline  25 mg Oral QHS   carvedilol  6.25 mg Oral BID WC   diazepam  5 mg Oral BID   fluticasone  2 spray Each Nare Daily   gabapentin  600 mg Oral TID   levothyroxine  100 mcg Oral QAC breakfast   lidocaine  1 patch Transdermal Q24H   pantoprazole  40 mg Oral Daily   sodium chloride flush  3 mL Intravenous Q12H   thiamine  100 mg Oral Daily   tiZANidine  4 mg Oral TID   Continuous Infusions:  sodium chloride Stopped (06/24/21 1428)    ceFAZolin (ANCEF) IV 2 g (06/25/21 0600)  LOS: 12 days       Desma Maxim, MD [facility]   If 7PM-7AM, please contact night-coverage  06/25/2021, 9:40 AM

## 2021-06-25 NOTE — Progress Notes (Signed)
Occupational Therapy Treatment Patient Details Name: Shaun Weaver MRN: [id number] DOB: 21-Mar-1960 Today's Date: 06/25/2021   History of present illness 61 year old male with alcoholic liver cirrhosis admitted for uncontrolled back pain and right sided sciatica   OT comments  Chart reviewed to date, pt greeted in room agreeable to OT tx session. Tx session targeted improving endurance and activity tolerance for ADL tasks. Pt is able to 10' in room with RW and supervision before requiring rest break. STS completed 5x with CGG-MIN A, intermittent vcs for technique. Improved tolerance for activity on this date however pt would continue to benefit from discharge to STR to address deficits as he continues to perform below PLOF. OT will continue to follow.    Recommendations for follow up therapy are one component of a multi-disciplinary discharge planning process, led by the attending physician.  Recommendations may be updated based on patient status, additional functional criteria and insurance authorization.    Follow Up Recommendations  Skilled nursing-short term rehab (<3 hours/day)    Assistance Recommended at Discharge Intermittent Supervision/Assistance  Patient can return home with the following  A lot of help with walking and/or transfers;A lot of help with bathing/dressing/bathroom;Help with stairs or ramp for entrance   Equipment Recommendations  BSC/3in1    Recommendations for Other Services      Precautions / Restrictions Precautions Precautions: Fall Restrictions Weight Bearing Restrictions: No       Mobility Bed Mobility Overal bed mobility: Needs Assistance Bed Mobility: Supine to Sit, Sit to Supine     Supine to sit: HOB elevated, Supervision Sit to supine: Supervision, HOB elevated        Transfers Overall transfer level: Needs assistance Equipment used: Rolling walker (2 wheels) Transfers: Sit to/from Stand Sit to Stand: Min guard, Min assist (6 attempts)            General transfer comment: bed low     Balance Overall balance assessment: Needs assistance Sitting-balance support: Feet supported Sitting balance-Leahy Scale: Good     Standing balance support: Bilateral upper extremity supported, During functional activity, Reliant on assistive device for balance Standing balance-Leahy Scale: Fair                             ADL either performed or assessed with clinical judgement   ADL Overall ADL's : Needs assistance/impaired                 Upper Body Dressing : Sitting;Set up Upper Body Dressing Details (indicate cue type and reason): doff t shirt     Toilet Transfer: Min guard;Ambulation;Rolling walker (2 wheels) Toilet Transfer Details (indicate cue type and reason): simulated         Functional mobility during ADLs: Min guard;Supervision/safety;Rolling walker (2 wheels) (approx 10 feet in room)      Extremity/Trunk Assessment              Vision       Perception     Praxis      Cognition Arousal/Alertness: Awake/alert Behavior During Therapy: WFL for tasks assessed/performed Overall Cognitive Status: Within Functional Limits for tasks assessed                                          Exercises      Shoulder Instructions       General  Comments      Pertinent Vitals/ Pain       Pain Assessment Pain Assessment: Faces Faces Pain Scale: Hurts whole lot Pain Location: R hip Pain Descriptors / Indicators: Grimacing, Guarding, Radiating Pain Intervention(s): Limited activity within patient's tolerance, Repositioned, Monitored during session  Home Living                                          Prior Functioning/Environment              Frequency  Min 2X/week        Progress Toward Goals  OT Goals(current goals can now be found in the care plan section)  Progress towards OT goals: Progressing toward goals     Plan Discharge  plan remains appropriate;Frequency remains appropriate    Co-evaluation                 AM-PAC OT "6 Clicks" Daily Activity     Outcome Measure   Help from another person eating meals?: None Help from another person taking care of personal grooming?: A Little Help from another person toileting, which includes using toliet, bedpan, or urinal?: A Little Help from another person bathing (including washing, rinsing, drying)?: A Lot Help from another person to put on and taking off regular upper body clothing?: None Help from another person to put on and taking off regular lower body clothing?: A Lot 6 Click Score: 18    End of Session Equipment Utilized During Treatment: Rolling walker (2 wheels)  OT Visit Diagnosis: Other abnormalities of gait and mobility (R26.89);Muscle weakness (generalized) (M62.81)   Activity Tolerance Patient tolerated treatment well   Patient Left in bed;with call bell/phone within reach;with bed alarm set   Nurse Communication Mobility status;Patient requests pain meds        Time: 0923-3007 OT Time Calculation (min): 18 min  Charges: OT General Charges $OT Visit: 1 Visit OT Treatments $Therapeutic Activity: 8-22 mins  Shanon Payor, OTD OTR/L  06/25/21, 3:58 PM

## 2021-06-26 DIAGNOSIS — B9561 Methicillin susceptible Staphylococcus aureus infection as the cause of diseases classified elsewhere: Secondary | ICD-10-CM

## 2021-06-26 DIAGNOSIS — R7881 Bacteremia: Secondary | ICD-10-CM

## 2021-06-26 LAB — CBC
HCT: 38.9 % — ABNORMAL LOW (ref 39.0–52.0)
Hemoglobin: 12.7 g/dL — ABNORMAL LOW (ref 13.0–17.0)
MCH: 30.4 pg (ref 26.0–34.0)
MCHC: 32.6 g/dL (ref 30.0–36.0)
MCV: 93.1 fL (ref 80.0–100.0)
Platelets: 48 10*3/uL — ABNORMAL LOW (ref 150–400)
RBC: 4.18 MIL/uL — ABNORMAL LOW (ref 4.22–5.81)
RDW: 15.2 % (ref 11.5–15.5)
WBC: 4.1 10*3/uL (ref 4.0–10.5)
nRBC: 0 % (ref 0.0–0.2)

## 2021-06-26 LAB — BASIC METABOLIC PANEL
Anion gap: 4 — ABNORMAL LOW (ref 5–15)
BUN: 19 mg/dL (ref 8–23)
CO2: 25 mmol/L (ref 22–32)
Calcium: 8.5 mg/dL — ABNORMAL LOW (ref 8.9–10.3)
Chloride: 108 mmol/L (ref 98–111)
Creatinine, Ser: 0.97 mg/dL (ref 0.61–1.24)
GFR, Estimated: >60 mL/min (ref >60)
Glucose, Bld: 119 mg/dL — ABNORMAL HIGH (ref 70–99)
Potassium: 4 mmol/L (ref 3.5–5.1)
Sodium: 137 mmol/L (ref 135–145)

## 2021-06-26 LAB — CULTURE, BLOOD (ROUTINE X 2): Special Requests: ADEQUATE

## 2021-06-26 MED ORDER — POLYETHYLENE GLYCOL 3350 17 G PO PACK: 17.0000 g | PACK | Freq: Every day | ORAL | Status: DC

## 2021-06-26 MED ORDER — POLYETHYLENE GLYCOL 3350 17 G PO PACK
17.0000 g | PACK | Freq: Every day | ORAL | Status: DC | PRN
  Administered 2021-06-27 – 2021-06-28 (×2): 17 g via ORAL
  Filled 2021-06-26 (×2): qty 1

## 2021-06-26 NOTE — TOC Progression Note (Signed)
Transition of Care [facility]) - Progression Note    Patient Details  Name: Shaun Weaver MRN: [id number] Date of Birth: 05-06-1960  [facility] CM/SW Contact  Beverly Sessions, RN Phone Number: 06/26/2021, 3:45 PM  Clinical Narrative:    Per MD anticipated patient would be medically ready for discharge Sunday.    Confirmed with Johna Roles at [facility] that they can accept patient on Sunday and no insurance auth needed    Expected Discharge Plan: [place] Barriers to Discharge: Continued Medical Work up  Expected Discharge Plan and Services Expected Discharge Plan: [place] arrangements for the past 2 months: Single Family Home                                       Social Determinants of Health (SDOH) Interventions    Readmission Risk Interventions     View : No data to display.

## 2021-06-26 NOTE — Progress Notes (Signed)
Physical Therapy Treatment Patient Details Name: Shaun Weaver MRN: [id number] DOB: [birth year] Today's Date: 06/26/2021   History of Present Illness 61 year old male with alcoholic liver cirrhosis admitted for uncontrolled back pain and right sided sciatica.    PT Comments    Pt was pleasant and motivated to participate during the session and put forth good effort throughout. Pt reported significantly increased RLE pain during the session and required physical assistance with bed mobility and transfers.  Pt was only able to take several effortful steps during the session with very little weight bearing through the RLE.  Pt reported no other adverse symptoms other than RLE pain with SpO2 and HR WNL on room air.  Pt will benefit from PT services in a SNF setting upon discharge to safely address deficits listed in patient problem list for decreased caregiver assistance and eventual return to PLOF.     Recommendations for follow up therapy are one component of a multi-disciplinary discharge planning process, led by the attending physician.  Recommendations may be updated based on patient status, additional functional criteria and insurance authorization.  Follow Up Recommendations  Skilled nursing-short term rehab (<3 hours/day)     Assistance Recommended at Discharge Frequent or constant Supervision/Assistance  Patient can return home with the following A little help with walking and/or transfers;A little help with bathing/dressing/bathroom;Assistance with cooking/housework;Assist for transportation;Help with stairs or ramp for entrance   Equipment Recommendations  BSC/3in1;Rolling walker (2 wheels);Wheelchair cushion (measurements PT);Wheelchair (measurements PT)    Recommendations for Other Services       Precautions / Restrictions Precautions Precautions: Fall Restrictions Weight Bearing Restrictions: No     Mobility  Bed Mobility Overal bed mobility: Needs Assistance          Sit to supine: Min assist   General bed mobility comments: Min A to manage the RLE back into bed    Transfers Overall transfer level: Needs assistance Equipment used: Rolling walker (2 wheels) Transfers: Sit to/from Stand Sit to Stand: Min assist           General transfer comment: Mod verbal cues for sequencing with min A and extra time and effort needed to come to full upright standing position    Ambulation/Gait Ambulation/Gait assistance: Min guard Gait Distance (Feet): 3 Feet Assistive device: Rolling walker (2 wheels) Gait Pattern/deviations: Step-to pattern, Decreased step length - left, Antalgic, Decreased stance time - right Gait velocity: decreased     General Gait Details: Pt only able to take several very small steps from chair to bed with step-to pattern and very heavy lean on the RW with pt stating that he put almost no weight through his RLE   Chief Strategy Officer    Modified Rankin (Stroke Patients Only)       Balance Overall balance assessment: Needs assistance Sitting-balance support: Feet supported Sitting balance-Leahy Scale: Good     Standing balance support: Bilateral upper extremity supported, During functional activity Standing balance-Leahy Scale: Fair                              Cognition Arousal/Alertness: Awake/alert Behavior During Therapy: WFL for tasks assessed/performed Overall Cognitive Status: Within Functional Limits for tasks assessed  Exercises Total Joint Exercises Ankle Circles/Pumps: Strengthening, Both, 5 reps, 10 reps (with manual resistance on the LLE) Quad Sets: Strengthening, Both, 5 reps, 10 reps Gluteal Sets: Strengthening, Both, 5 reps, 10 reps Hip ABduction/ADduction: AAROM, Strengthening, Left, 10 reps Straight Leg Raises: AAROM, Strengthening, Left, 10 reps Long Arc Quad: Strengthening, Both, 10 reps Other  Exercises Other Exercises: HEP education for BLE APs, QS, and GS    General Comments        Pertinent Vitals/Pain Pain Assessment Pain Assessment: 0-10 Pain Score: 8  Pain Location: R hip Pain Descriptors / Indicators: Grimacing, Guarding, Aching Pain Intervention(s): Repositioned, Premedicated before session, Monitored during session    Home Living                          Prior Function            PT Goals (current goals can now be found in the care plan section) Progress towards PT goals: Not progressing toward goals - comment (limited by pain)    Frequency    Min 2X/week      PT Plan Current plan remains appropriate    Co-evaluation              AM-PAC PT "6 Clicks" Mobility   Outcome Measure  Help needed turning from your back to your side while in a flat bed without using bedrails?: A Little Help needed moving from lying on your back to sitting on the side of a flat bed without using bedrails?: A Little Help needed moving to and from a bed to a chair (including a wheelchair)?: A Little Help needed standing up from a chair using your arms (e.g., wheelchair or bedside chair)?: A Little Help needed to walk in hospital room?: A Lot Help needed climbing 3-5 steps with a railing? : Total 6 Click Score: 15    End of Session Equipment Utilized During Treatment: Gait belt Activity Tolerance: Patient limited by pain Patient left: in bed;with call bell/phone within reach;with bed alarm set Nurse Communication: Mobility status PT Visit Diagnosis: Difficulty in walking, not elsewhere classified (R26.2);Pain;Muscle weakness (generalized) (M62.81) Pain - Right/Left: Right Pain - part of body: Leg     Time: 5366-4403 PT Time Calculation (min) (ACUTE ONLY): 24 min  Charges:  $Therapeutic Exercise: 8-22 mins $Therapeutic Activity: 8-22 mins                     D. Scott Donyell Ding PT, DPT 06/26/21, 5:24 PM

## 2021-06-26 NOTE — Progress Notes (Signed)
Date of Admission:  06/12/2021     ID: Shaun Weaver is a 61 y.o. male  Active Problems:   Alcoholic cirrhosis of liver (HCC)   Esophageal varices without bleeding (HCC)   Gastro-esophageal reflux disease without esophagitis   Hypothyroidism, unspecified   Hypertension, essential, benign   Hypoxia   Sciatica of right side associated with disorder of lumbosacral spine   Thrombocytopenia (HCC)   Right knee pain   MSSA bacteremia    Subjective: Pt doing better Pain back and rt leg pain seem to have improved  No fever  Medications:   amitriptyline  25 mg Oral QHS   carvedilol  6.25 mg Oral BID WC   diazepam  5 mg Oral BID   fluticasone  2 spray Each Nare Daily   gabapentin  600 mg Oral TID   levothyroxine  100 mcg Oral QAC breakfast   lidocaine  1 patch Transdermal Q24H   pantoprazole  40 mg Oral Daily   sodium chloride flush  3 mL Intravenous Q12H   thiamine  100 mg Oral Daily   tiZANidine  4 mg Oral TID    Objective: Vital signs in last 24 hours: Patient Vitals for the past 24 hrs:  BP Temp Temp src Pulse Resp SpO2 Weight  06/26/21 0802 138/81 97.6 F (36.4 C) Oral (!) 57 20 96 % --  06/26/21 0500 -- -- -- -- -- -- 118.9 kg  06/26/21 0404 134/84 (!) 97.4 F (36.3 C) -- (!) 59 16 97 % --  06/25/21 2022 127/68 97.7 F (36.5 C) -- 65 20 98 % --  06/25/21 1548 122/70 97.8 F (36.6 C) Oral 70 16 100 % --     PHYSICAL EXAM:  General: Alert, cooperative, no distress, appears stated age.  Head: Normocephalic, without obvious abnormality, atraumatic. Eyes: Conjunctivae clear, anicteric sclerae. Pupils are equal ENT Nares normal. No drainage or sinus tenderness. Lips, mucosa, and tongue normal. No Thrush Neck: Supple, symmetrical, no adenopathy, thyroid: non tender no carotid bruit and no JVD. Back: No CVA tenderness. Lungs: Clear to auscultation bilaterally. No Wheezing or Rhonchi. No rales. Heart: Regular rate and rhythm, no murmur, rub or gallop. Abdomen: Soft,  non-tender,not distended. Bowel sounds normal. No masses Extremities: atraumatic, no cyanosis. No edema. No clubbing Skin: No rashes or lesions. Or bruising Lymph: Cervical, supraclavicular normal. Neurologic: Grossly non-focal  Lab Results Recent Labs    06/25/21 0533 06/26/21 0526  WBC 3.6* 4.1  HGB 12.7* 12.7*  HCT 38.5* 38.9*  NA 134* 137  K 4.1 4.0  CL 105 108  CO2 23 25  BUN 18 19  CREATININE 1.10 0.97  Microbiology: 06/23/21 4/4 blood culture MSSA 06/26/21 BC-  Studies/Results: MR Lumbar Spine W Wo Contrast  Result Date: 06/25/2021 CLINICAL DATA:  Bacteremia, recent epidural injection EXAM: MRI LUMBAR SPINE WITHOUT AND WITH CONTRAST TECHNIQUE: Multiplanar and multiecho pulse sequences of the lumbar spine were obtained without and with intravenous contrast. CONTRAST:  35m GADAVIST GADOBUTROL 1 MMOL/ML IV SOLN COMPARISON:  06/12/2021 MRI lumbar spine FINDINGS: Segmentation:  Standard. Alignment:  Physiologic. Vertebrae: No acute fracture, evidence of discitis, or suspicious osseous lesion. No abnormal osseous enhancement. Unchanged appearance of the vertebral bodies compared to 06/12/2021. Conus medullaris and cauda equina: Conus extends to the T12 level. Conus and cauda equina appear normal. No abnormal enhancement. Paraspinal and other soft tissues: Bilateral renal atrophy and multiple small renal cysts. Disc levels: T12-L1: No significant disc bulge. No spinal canal stenosis or neural  foraminal narrowing. L1-L2: No significant disc bulge. Mild facet arthropathy. No spinal canal stenosis or neural foraminal narrowing. L2-L3: No significant disc bulge. Mild facet arthropathy. No spinal canal stenosis or neural foraminal narrowing. L3-L4: No significant disc bulge. Mild facet arthropathy. No spinal canal stenosis or neural foraminal narrowing. L4-L5: No significant disc bulge. Moderate facet arthropathy. Narrowing of the lateral recesses. No spinal canal stenosis or neural foraminal  narrowing. L5-S1: Disc height loss with right eccentric disc bulge. Mild facet arthropathy. Disc protrusion contacts the descending right S1 nerve roots. No spinal canal stenosis. Mild right neural foraminal narrowing. IMPRESSION: 1. No evidence of discitis osteomyelitis. No acute osseous abnormality. 2. L5-S1 disc protrusion that contacts the descending right S1 nerve roots. Mild right neural foraminal narrowing at this level. 3. Narrowing of the lateral recesses at L4-L5, which could affect the descending L5 nerve roots. Electronically Signed   By: Merilyn Baba M.D.   On: 06/25/2021 02:20     Assessment/Plan:  MSSA bacteremia- source likely left cubital was initially purulence Pt also had epidural steroid injection on 06/16/21 L4-L5- concern for abscess Repeat MRI on 5/25 Neg As the bacteremia developed nearly 10 days after hospitalization less likely to have endocarditis PT will still need 4 weeks of IV cefazolin Once repeat blood culture is negative can insert PICC  Cirrhosis liver with splenomegaly  H/o portal vein thrombosis H/o esphageal varices  Back pain with rt sciatica   OPAT Orders   Diagnosis: MSSA bacteremia  Baseline Creatinine <1   No Known Allergies  Discharge antibiotics: Cefazolin 2 grams IV every 8hours  Duration: 4 weeks End Date: 07/22/21  [facility] Care Per Protocol:  Labs weekly while on IV antibiotics: _X_ CBC with differential  _X_ CMP   _X_ Please pull PIC at completion of IV antibiotics   Fax weekly labs to [place] [phone]  Clinic Follow Up Appt: 07/16/21 at [place] Call [phone]  With any questions or concerns

## 2021-06-26 NOTE — Progress Notes (Signed)
PROGRESS NOTE    Shaun Weaver  FBX:[id number] DOB: Sep 20, 1960 DOA: 06/12/2021 PCP: Sofie Hartigan, MD    Brief Narrative:  61 year old male with alcoholic liver cirrhosis admitted for uncontrolled back pain and right sided sciatica   5/13: Started Medrol Dosepak, Toradol 4 times daily.  PT, OT eval 5/14: PT, OT recommends SNF.  TOC aware 5/15: Right knee x-ray.  SNF search in progress 5/16: ESI planned under IR 5/17: Status post epidural spinal injection.  Back pain overall improved, but not yet back to baseline 5/18: Back pain persistent.  Most severe in the buttock and radiating down the leg 5/19.  Persistent back pain.  Not a good surgical candidate per nsg 5/21: Limited relief despite escalating doses of medications 5/22: Pain control seems to be improving though patient more lethargic today. 5/23: Pain control seems to be improving however patient appears somewhat more delirious today.  Endorsing some difficulty breathing.  Low-grade temperature noted 100.8.  Chest x-ray with evolving left infiltrate concerning for CAP. But blood cultures mssa positive  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   Hypoxia   Alcoholic cirrhosis of liver (HCC)   Esophageal varices without bleeding (HCC)   Gastro-esophageal reflux disease without esophagitis   Hypothyroidism, unspecified   Hypertension, essential, benign   Thrombocytopenia (HCC)   Right knee pain   MSSA bacteremia  MSSA bacteremia Tmax 100.8 noted 5/23, last fever AM 5/24. MSSA on blood culture ID. CXR With concerns for pna but clinically no symptoms. Likely source if cellulitis left antecubital fossa fIV thrombophlebitis. TTE no vegetations. Recent injection lumbar spine - MRI no signs infection. - ID consulted - continue cefazolin per ID, pan-sensitive. Won't need TEE. Repeat blood cultures drawn this morning. If remain negative can get PICC on Sunday and  discharge then  Cellulitis Induration left antecubital fossa with erythema, small amount of purulence expressed at bedside 5/24, no clear abscess. The likely culprit. PIV has been removed. Improving. - cefazolin as above - monitor  Back pain Status post [facility] 5/16 Initially back pain improved, now slightly worse on 5/18 No neurosurgical intervention warranted On Medrol Dosepak per neurosurgery recommendations Reengaged neurosurgery for comment on 5/21 Switch to tizanidine on 5/21 with good result Plan: Continue following pain management.  Gabapentin 400 mg 3 times daily, Zanaflex 4 mg 3 times daily, Valium 5 mg twice daily, Lidoderm patch.  Physical therapy as tolerated.  Patient not a surgical candidate at this time.  Will need several weeks of medical therapy, physical therapy and frequent evaluations.  Can consider neurosurgical decompression in 4 to 6 weeks if pain remains uncontrolled.  Pain control is been quite challenging. Per TOC has bed available   Sciatica of right side associated with disorder of lumbosacral spine As above   Hypoxia, resolved He is on room air now negative dVT.   Alcoholic cirrhosis of liver (HCC) Pt no longer drinks. F/u with [facility]  Imaging in past showed cholecystitis His LFTs are within normal limit and total bilirubin was high but is improving.   It is possible he could have passed gallstone He is not coagulopathic thrombocytopenia makes him a dangerous surgical candidate   Esophageal varices without bleeding (HCC) dvt prophylaxis with heparin/Lovenox held due to low platelets.  Scd's. P.o. PPI   Gastro-esophageal reflux disease without esophagitis Continue Protonix   Hypothyroidism, unspecified Continue levothyroxine.    Hypertension, essential, benign  continue Coreg  Thrombocytopenia (HCC) Monitor platelet counts.  This is likely due to alcoholism Avoid Lovenox   Right knee pain Negative x-ray for any acute pathology.  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   This is likely from referred pain from the back.  Appreciate Ortho input No surgical intervention warranted Multimodal pain regimen as above   DVT prophylaxis: SCDs Code Status: Full Family Communication: Sister updated telephonically 5/25. No answer when called today Disposition Plan: Status is: Inpatient Remains inpatient appropriate because: treatment of bacteremia   Level of care: Med-Surg  Consultants:  Neurosurgery, ID  Procedures:  Epidural spinal injection 5/16  Antimicrobials: None   Subjective: Patient seen and examined. Pain improved. Tolerating diet.  Objective: Vitals:   06/25/21 2022 06/26/21 0404 06/26/21 0500 06/26/21 0802  BP: 127/68 134/84  138/81  Pulse: 65 (!) 59  (!) 57  Resp: '20 16  20  '$ Temp: 97.7 F (36.5 C) (!) 97.4 F (36.3 C)  97.6 F (36.4 C)  TempSrc:    Oral  SpO2: 98% 97%  96%  Weight:   118.9 kg   Height:        Intake/Output Summary (Last 24 hours) at 06/26/2021 1402 Last data filed at 06/26/2021 1034 Gross per 24 hour  Intake 665.94 ml  Output 1100 ml  Net -434.06 ml   Filed Weights   06/20/21 0600 06/22/21 0500 06/26/21 0500  Weight: 122.2 kg 123.5 kg 118.9 kg    Examination:  General exam: NAD.  Appears uncomfortable Respiratory system: Left-sided crackles.  Normal work of breathing.  Room air Cardiovascular system: S1-S2, RRR, no murmurs, no pedal edema Gastrointestinal system: Soft, NT/ND, normal bowel sounds Central nervous system: Alert and oriented. No focal neurological deficits. Extremities: Decreased power BLE.  Decreased range of motion skin: No rashes, lesions or ulcers Psychiatry: Judgement and insight appear normal. Mood & affect appropriate.  Msk: no spinous process tenderness    Data Reviewed: I have personally reviewed following labs and imaging studies  CBC: Recent Labs  Lab 06/21/21 1124 06/23/21 0928 06/25/21 0533 06/26/21 0526  WBC 4.4 4.3 3.6* 4.1  NEUTROABS 3.3 3.3  --   --    HGB 13.6 13.8 12.7* 12.7*  HCT 41.8 42.1 38.5* 38.9*  MCV 95.2 94.0 92.8 93.1  PLT 58* 46* 41* 48*   Basic Metabolic Panel: Recent Labs  Lab 06/21/21 1124 06/23/21 0928 06/25/21 0533 06/26/21 0526  NA 138 132* 134* 137  K 4.8 4.2 4.1 4.0  CL 107 103 105 108  CO2 '28 22 23 25  '$ GLUCOSE 144* 117* 123* 119*  BUN '19 16 18 19  '$ CREATININE 1.04 1.06 1.10 0.97  CALCIUM 8.8* 9.1 8.4* 8.5*   GFR: Estimated Creatinine Clearance: 105.1 mL/min (by C-G formula based on SCr of 0.97 mg/dL). Liver Function Tests: No results for input(s): AST, ALT, ALKPHOS, BILITOT, PROT, ALBUMIN in the last 168 hours.  No results for input(s): LIPASE, AMYLASE in the last 168 hours.  No results for input(s): AMMONIA in the last 168 hours. Coagulation Profile: Recent Labs  Lab 06/21/21 1124  INR 1.3*   Cardiac Enzymes: No results for input(s): CKTOTAL, CKMB, CKMBINDEX, TROPONINI in the last 168 hours. BNP (last 3 results) No results for input(s): PROBNP in the last 8760 hours. HbA1C: No results for input(s): HGBA1C in the last 72 hours. CBG: No results for input(s): GLUCAP in the last 168 hours. Lipid Profile: No results for input(s): CHOL, HDL, LDLCALC, TRIG, CHOLHDL, LDLDIRECT in the last 72 hours. Thyroid  Function Tests: No results for input(s): TSH, T4TOTAL, FREET4, T3FREE, THYROIDAB in the last 72 hours. Anemia Panel: No results for input(s): VITAMINB12, FOLATE, FERRITIN, TIBC, IRON, RETICCTPCT in the last 72 hours. Sepsis Labs: Recent Labs  Lab 06/23/21 0928  PROCALCITON 0.15    Recent Results (from the past 240 hour(s))  Culture, blood (Routine X 2) w Reflex to ID Panel     Status: Abnormal   Collection Time: 06/23/21  9:28 AM   Specimen: BLOOD  Result Value Ref Range Status   Specimen Description   Final    BLOOD  LEFT HAND Performed at [facility], [address] [place], [place] [address]    Special Requests   Final    BOTTLES DRAWN AEROBIC AND ANAEROBIC Blood  Culture adequate volume Performed at [facility], [address] [place], [place] [address]    Culture  Setup Time   Final    Organism ID to follow [place] AEROBIC AND ANAEROBIC BOTTLES CRITICAL RESULT CALLED TO, READ BACK BY AND VERIFIED WITH: JASON ROBBINS @ 0011 ON 06/24/2021.Marland KitchenMarland KitchenTKR GRAM STAIN REVIEWED-AGREE WITH RESULT Performed at [facility], [place] [address] [place], [place] [address]    Culture STAPHYLOCOCCUS AUREUS (A)  Final   Report Status 06/26/2021 FINAL  Final   Organism ID, Bacteria STAPHYLOCOCCUS AUREUS  Final      Susceptibility   Staphylococcus aureus - MIC*    CIPROFLOXACIN <=0.5 SENSITIVE Sensitive     ERYTHROMYCIN <=0.25 SENSITIVE Sensitive     GENTAMICIN <=0.5 SENSITIVE Sensitive     OXACILLIN <=0.25 SENSITIVE Sensitive     TETRACYCLINE <=1 SENSITIVE Sensitive     VANCOMYCIN <=0.5 SENSITIVE Sensitive     TRIMETH/SULFA <=10 SENSITIVE Sensitive     CLINDAMYCIN <=0.25 SENSITIVE Sensitive     RIFAMPIN <=0.5 SENSITIVE Sensitive     Inducible Clindamycin NEGATIVE Sensitive     * STAPHYLOCOCCUS AUREUS  Blood Culture ID Panel (Reflexed)     Status: Abnormal   Collection Time: 06/23/21  9:28 AM  Result Value Ref Range Status   Enterococcus faecalis NOT DETECTED NOT DETECTED Final   Enterococcus Faecium NOT DETECTED NOT DETECTED Final   Listeria monocytogenes NOT DETECTED NOT DETECTED Final   Staphylococcus species DETECTED (A) NOT DETECTED Final    Comment: CRITICAL RESULT CALLED TO, READ BACK BY AND VERIFIED WITH: JASON ROBBINS @ 0011 ON 06/24/2021.Marland KitchenMarland KitchenTKR    Staphylococcus aureus (BCID) DETECTED (A) NOT DETECTED Final    Comment: CRITICAL RESULT CALLED TO, READ BACK BY AND VERIFIED WITH: JASON ROBBINS @ 0011 ON 06/24/2021.Marland KitchenMarland KitchenTKR    Staphylococcus epidermidis NOT DETECTED NOT DETECTED Final   Staphylococcus lugdunensis NOT DETECTED NOT DETECTED Final   Streptococcus species NOT DETECTED NOT DETECTED Final   Streptococcus  agalactiae NOT DETECTED NOT DETECTED Final   Streptococcus pneumoniae NOT DETECTED NOT DETECTED Final   Streptococcus pyogenes NOT DETECTED NOT DETECTED Final   A.calcoaceticus-baumannii NOT DETECTED NOT DETECTED Final   Bacteroides fragilis NOT DETECTED NOT DETECTED Final   Enterobacterales NOT DETECTED NOT DETECTED Final   Enterobacter cloacae complex NOT DETECTED NOT DETECTED Final   Escherichia coli NOT DETECTED NOT DETECTED Final   Klebsiella aerogenes NOT DETECTED NOT DETECTED Final   Klebsiella oxytoca NOT DETECTED NOT DETECTED Final   Klebsiella pneumoniae NOT DETECTED NOT DETECTED Final   Proteus species NOT DETECTED NOT DETECTED Final   Salmonella species NOT DETECTED NOT DETECTED Final   Serratia marcescens NOT DETECTED NOT DETECTED Final  Haemophilus influenzae NOT DETECTED NOT DETECTED Final   Neisseria meningitidis NOT DETECTED NOT DETECTED Final   Pseudomonas aeruginosa NOT DETECTED NOT DETECTED Final   Stenotrophomonas maltophilia NOT DETECTED NOT DETECTED Final   Candida albicans NOT DETECTED NOT DETECTED Final   Candida auris NOT DETECTED NOT DETECTED Final   Candida glabrata NOT DETECTED NOT DETECTED Final   Candida krusei NOT DETECTED NOT DETECTED Final   Candida parapsilosis NOT DETECTED NOT DETECTED Final   Candida tropicalis NOT DETECTED NOT DETECTED Final   Cryptococcus neoformans/gattii NOT DETECTED NOT DETECTED Final   Meth resistant mecA/C and MREJ NOT DETECTED NOT DETECTED Final    Comment: Performed at [facility], [address] [place], [place] [address]  Culture, blood (Routine X 2) w Reflex to ID Panel     Status: Abnormal   Collection Time: 06/23/21  9:34 AM   Specimen: BLOOD RIGHT HAND  Result Value Ref Range Status   Specimen Description   Final    BLOOD RIGHT HAND Performed at [place] [address], [place], [place] [address]    Special Requests   Final    BOTTLES DRAWN AEROBIC AND ANAEROBIC Blood Culture adequate  volume Performed at [facility], [address] [place], [place] [address]    Culture  Setup Time   Final    GRAM POSITIVE COCCI IN BOTH AEROBIC AND ANAEROBIC BOTTLES CRITICAL VALUE NOTED.  VALUE IS CONSISTENT WITH PREVIOUSLY REPORTED AND CALLED VALUE. GRAM STAIN REVIEWED-AGREE WITH RESULT Performed at [facility], [address] [place], [place] [address]    Culture (A)  Final    STAPHYLOCOCCUS AUREUS SUSCEPTIBILITIES PERFORMED ON PREVIOUS CULTURE WITHIN THE LAST 5 DAYS. Performed at [facility], [place] [address] [place], [place] [address]    Report Status 06/26/2021 FINAL  Final         Radiology Studies: MR Lumbar Spine W Wo Contrast  Result Date: 06/25/2021 CLINICAL DATA:  Bacteremia, recent epidural injection EXAM: MRI LUMBAR SPINE WITHOUT AND WITH CONTRAST TECHNIQUE: Multiplanar and multiecho pulse sequences of the lumbar spine were obtained without and with intravenous contrast. CONTRAST:  57m GADAVIST GADOBUTROL 1 MMOL/ML IV SOLN COMPARISON:  06/12/2021 MRI lumbar spine FINDINGS: Segmentation:  Standard. Alignment:  Physiologic. Vertebrae: No acute fracture, evidence of discitis, or suspicious osseous lesion. No abnormal osseous enhancement. Unchanged appearance of the vertebral bodies compared to 06/12/2021. Conus medullaris and cauda equina: Conus extends to the T12 level. Conus and cauda equina appear normal. No abnormal enhancement. Paraspinal and other soft tissues: Bilateral renal atrophy and multiple small renal cysts. Disc levels: T12-L1: No significant disc bulge. No spinal canal stenosis or neural foraminal narrowing. L1-L2: No significant disc bulge. Mild facet arthropathy. No spinal canal stenosis or neural foraminal narrowing. L2-L3: No significant disc bulge. Mild facet arthropathy. No spinal canal stenosis or neural foraminal narrowing. L3-L4: No significant disc bulge. Mild facet arthropathy. No spinal canal stenosis or neural foraminal  narrowing. L4-L5: No significant disc bulge. Moderate facet arthropathy. Narrowing of the lateral recesses. No spinal canal stenosis or neural foraminal narrowing. L5-S1: Disc height loss with right eccentric disc bulge. Mild facet arthropathy. Disc protrusion contacts the descending right S1 nerve roots. No spinal canal stenosis. Mild right neural foraminal narrowing. IMPRESSION: 1. No evidence of discitis osteomyelitis. No acute osseous abnormality. 2. L5-S1 disc protrusion that contacts the descending right S1 nerve roots. Mild right neural foraminal narrowing at this level. 3. Narrowing of the lateral recesses  at L4-L5, which could affect the descending L5 nerve roots. Electronically Signed   By: Merilyn Baba M.D.   On: 06/25/2021 02:20        Scheduled Meds:  amitriptyline  25 mg Oral QHS   carvedilol  6.25 mg Oral BID WC   diazepam  5 mg Oral BID   fluticasone  2 spray Each Nare Daily   gabapentin  600 mg Oral TID   levothyroxine  100 mcg Oral QAC breakfast   lidocaine  1 patch Transdermal Q24H   pantoprazole  40 mg Oral Daily   sodium chloride flush  3 mL Intravenous Q12H   thiamine  100 mg Oral Daily   tiZANidine  4 mg Oral TID   Continuous Infusions:  sodium chloride 10 mL/hr at 06/26/21 1337    ceFAZolin (ANCEF) IV 2 g (06/26/21 1338)     LOS: 13 days       Desma Maxim, MD [facility]   If 7PM-7AM, please contact night-coverage  06/26/2021, 2:02 PM

## 2021-06-26 NOTE — Progress Notes (Signed)
Occupational Therapy Treatment Patient Details Name: Shaun Weaver MRN: [id number] DOB: [birth year] Today's Date: 06/26/2021   History of present illness 61 year old male with alcoholic liver cirrhosis admitted for uncontrolled back pain and right sided sciatica   OT comments  Shaun Weaver was seen for OT treatment on this date. Upon arrival to room pt reclined in bed, agreeable to tx. Pt requires  CGA + RW with B forearm support for balance during grooming tasks in standing, tolerates ~3 minutes. CGA + RW for ADL t/f ~10 ft. Pt making good progress toward goals. Pt continues to be limited by pain. Will continue to follow POC. Discharge recommendation remains appropriate.     Recommendations for follow up therapy are one component of a multi-disciplinary discharge planning process, led by the attending physician.  Recommendations may be updated based on patient status, additional functional criteria and insurance authorization.    Follow Up Recommendations  Skilled nursing-short term rehab (<3 hours/day)    Assistance Recommended at Discharge Intermittent Supervision/Assistance  Patient can return home with the following  A lot of help with walking and/or transfers;A lot of help with bathing/dressing/bathroom;Help with stairs or ramp for entrance   Equipment Recommendations  BSC/3in1    Recommendations for Other Services      Precautions / Restrictions Precautions Precautions: Fall Restrictions Weight Bearing Restrictions: No       Mobility Bed Mobility Overal bed mobility: Needs Assistance Bed Mobility: Supine to Sit     Supine to sit: Supervision          Transfers Overall transfer level: Needs assistance Equipment used: Rolling walker (2 wheels) Transfers: Sit to/from Stand Sit to Stand: Min guard                 Balance Overall balance assessment: Needs assistance Sitting-balance support: Feet supported Sitting balance-Leahy Scale: Good     Standing balance  support: During functional activity, Single extremity supported Standing balance-Leahy Scale: Fair                             ADL either performed or assessed with clinical judgement   ADL Overall ADL's : Needs assistance/impaired                                       General ADL Comments: CGA + RW with B forearm support for balance during grooming tasks in standing, tolerates ~3 minutes. CGA + RW for ADL t/f ~10 ft      Cognition Arousal/Alertness: Awake/alert Behavior During Therapy: WFL for tasks assessed/performed Overall Cognitive Status: Within Functional Limits for tasks assessed                                                     Pertinent Vitals/ Pain       Pain Assessment Pain Assessment: 0-10 Pain Score: 10-Worst pain ever Pain Location: R hip Pain Descriptors / Indicators: Grimacing, Guarding, Radiating Pain Intervention(s): Limited activity within patient's tolerance, Repositioned   Frequency  Min 2X/week        Progress Toward Goals  OT Goals(current goals can now be found in the care plan section)  Progress towards OT goals: Progressing toward goals  Acute Rehab OT  Goals Patient Stated Goal: to go home OT Goal Formulation: With patient Time For Goal Achievement: 07/08/21 Potential to Achieve Goals: Good ADL Goals Pt Will Perform Grooming: with modified independence;standing Pt Will Perform Lower Body Dressing: with modified independence;with caregiver independent in assisting;sit to/from stand Pt Will Transfer to Toilet: with modified independence;ambulating;regular height toilet  Plan Discharge plan remains appropriate;Frequency remains appropriate    Co-evaluation                 AM-PAC OT "6 Clicks" Daily Activity     Outcome Measure   Help from another person eating meals?: None Help from another person taking care of personal grooming?: A Little Help from another person toileting,  which includes using toliet, bedpan, or urinal?: A Little Help from another person bathing (including washing, rinsing, drying)?: A Lot Help from another person to put on and taking off regular upper body clothing?: None Help from another person to put on and taking off regular lower body clothing?: A Lot 6 Click Score: 18    End of Session Equipment Utilized During Treatment: Rolling walker (2 wheels)  OT Visit Diagnosis: Other abnormalities of gait and mobility (R26.89);Muscle weakness (generalized) (M62.81)   Activity Tolerance Patient tolerated treatment well   Patient Left in chair;with call bell/phone within reach;with family/visitor present   Nurse Communication          Time: 5102-5852 OT Time Calculation (min): 12 min  Charges: OT General Charges $OT Visit: 1 Visit OT Treatments $Self Care/Home Management : 8-22 mins  Dessie Coma, M.S. OTR/L  06/26/21, 2:54 PM  ascom [phone]

## 2021-06-26 NOTE — Plan of Care (Signed)
  Problem: Education: Goal: Knowledge of General Education information will improve Description: Including pain rating scale, medication(s)/side effects and non-pharmacologic comfort measures Outcome: Progressing   Problem: Clinical Measurements: Goal: Ability to maintain clinical measurements within normal limits will improve Outcome: Progressing Goal: Cardiovascular complication will be avoided Outcome: Progressing   Problem: Activity: Goal: Risk for activity intolerance will decrease Outcome: Progressing   Problem: Nutrition: Goal: Adequate nutrition will be maintained Outcome: Progressing   Problem: Coping: Goal: Level of anxiety will decrease Outcome: Progressing   Problem: Elimination: Goal: Will not experience complications related to bowel motility Outcome: Progressing Goal: Will not experience complications related to urinary retention Outcome: Progressing

## 2021-06-27 NOTE — Progress Notes (Signed)
PROGRESS NOTE    Shaun Weaver  ZOX:[id number] DOB: December 12, 1960 DOA: 06/12/2021 PCP: Sofie Hartigan, MD    Brief Narrative:  61 year old male with alcoholic liver cirrhosis admitted for uncontrolled back pain and right sided sciatica   5/13: Started Medrol Dosepak, Toradol 4 times daily.  PT, OT eval 5/14: PT, OT recommends SNF.  TOC aware 5/15: Right knee x-ray.  SNF search in progress 5/16: ESI planned under IR 5/17: Status post epidural spinal injection.  Back pain overall improved, but not yet back to baseline 5/18: Back pain persistent.  Most severe in the buttock and radiating down the leg 5/19.  Persistent back pain.  Not a good surgical candidate per nsg 5/21: Limited relief despite escalating doses of medications 5/22: Pain control seems to be improving though patient more lethargic today. 5/23: Pain control seems to be improving however patient appears somewhat more delirious today.  Endorsing some difficulty breathing.  Low-grade temperature noted 100.8.  Chest x-ray with evolving left infiltrate concerning for CAP. But blood cultures mssa positive  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   Hypoxia   Alcoholic cirrhosis of liver (HCC)   Esophageal varices without bleeding (HCC)   Gastro-esophageal reflux disease without esophagitis   Hypothyroidism, unspecified   Hypertension, essential, benign   Thrombocytopenia (HCC)   Right knee pain   MSSA bacteremia  MSSA bacteremia Tmax 100.8 noted 5/23, last fever AM 5/24. MSSA on blood culture ID. CXR With concerns for pna but clinically no symptoms. Likely source if cellulitis left antecubital fossa fIV thrombophlebitis. TTE no vegetations. Recent injection lumbar spine - MRI no signs infection. - ID consulted - continue cefazolin per ID, pan-sensitive. Won't need TEE. Repeat blood cultures drawn 5/26. If remain negative can get PICC on Sunday and discharge  then - will ask TOC to confirm abx plan w/ snf  Cellulitis Induration left antecubital fossa with erythema, small amount of purulence expressed at bedside 5/24, no clear abscess. The likely culprit. PIV has been removed. Improving. - cefazolin as above - monitor  Back pain Status post [facility] 5/16 Initially back pain improved, now slightly worse on 5/18 No neurosurgical intervention warranted On Medrol Dosepak per neurosurgery recommendations Reengaged neurosurgery for comment on 5/21 Switch to tizanidine on 5/21 with good result Plan: Continue following pain management.  Gabapentin 400 mg 3 times daily, Zanaflex 4 mg 3 times daily, Valium 5 mg twice daily, Lidoderm patch.  Physical therapy as tolerated.  Patient not a surgical candidate at this time.  Will need several weeks of medical therapy, physical therapy and frequent evaluations.  Can consider neurosurgical decompression in 4 to 6 weeks if pain remains uncontrolled.  Pain control is been quite challenging. Per TOC has bed available. Plan to f/u with [facility] after d/c (is a patient there)   Sciatica of right side associated with disorder of lumbosacral spine As above   Hypoxia, resolved He is on room air now negative dVT.   Alcoholic cirrhosis of liver (HCC) Pt no longer drinks. F/u [facility]  Imaging in past showed cholecystitis His LFTs are within normal limit and total bilirubin was high but is improving.   It is possible he could have passed gallstone He is not coagulopathic thrombocytopenia makes him a dangerous surgical candidate   Esophageal varices without bleeding (HCC) dvt prophylaxis with heparin/Lovenox held due to low platelets.  Scd's. P.o. PPI   Gastro-esophageal reflux disease  without esophagitis Continue Protonix   Hypothyroidism, unspecified Continue levothyroxine.    Hypertension, essential, benign  continue Coreg    Thrombocytopenia (HCC) Monitor platelet counts.  This is likely due to  alcoholism Avoid Lovenox   Right knee pain Negative x-ray for any acute pathology.  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   This is likely from referred pain from the back.  Appreciate Ortho input No surgical intervention warranted Multimodal pain regimen as above   DVT prophylaxis: SCDs Code Status: Full Family Communication: Sister updated at bedside 5/27 Disposition Plan: Status is: Inpatient Remains inpatient appropriate because: treatment of bacteremia   Level of care: Med-Surg  Consultants:  Neurosurgery, ID  Procedures:  Epidural spinal injection 5/16  Antimicrobials: None   Subjective: Patient seen and examined. Pain right hip/leg persists. Tolerating diet.  Objective: Vitals:   06/26/21 1602 06/26/21 1949 06/27/21 0514 06/27/21 0855  BP: 113/73 128/75 122/77 (!) 160/86  Pulse: 66 64 64 68  Resp: '20 20 18 18  '$ Temp: 97.9 F (36.6 C) 97.8 F (36.6 C) 97.9 F (36.6 C) 97.8 F (36.6 C)  TempSrc: Oral  Oral Oral  SpO2: 100% 99% 98% 98%  Weight:      Height:        Intake/Output Summary (Last 24 hours) at 06/27/2021 1214 Last data filed at 06/27/2021 1100 Gross per 24 hour  Intake 902.21 ml  Output 700 ml  Net 202.21 ml   Filed Weights   06/20/21 0600 06/22/21 0500 06/26/21 0500  Weight: 122.2 kg 123.5 kg 118.9 kg    Examination:  General exam: NAD.  Appears uncomfortable Respiratory system: Left-sided crackles.  Normal work of breathing.  Room air Cardiovascular system: S1-S2, RRR, no murmurs, no pedal edema Gastrointestinal system: Soft, NT/ND, normal bowel sounds Central nervous system: Alert and oriented. No focal neurological deficits. Extremities: Decreased power BLE.  Decreased range of motion skin: induration left antecubital fossa iimproving Psychiatry: Judgement and insight appear normal. Mood & affect appropriate.  Msk: no spinous process tenderness    Data Reviewed: I have personally reviewed following labs and imaging studies  CBC: Recent  Labs  Lab 06/21/21 1124 06/23/21 0928 06/25/21 0533 06/26/21 0526  WBC 4.4 4.3 3.6* 4.1  NEUTROABS 3.3 3.3  --   --   HGB 13.6 13.8 12.7* 12.7*  HCT 41.8 42.1 38.5* 38.9*  MCV 95.2 94.0 92.8 93.1  PLT 58* 46* 41* 48*   Basic Metabolic Panel: Recent Labs  Lab 06/21/21 1124 06/23/21 0928 06/25/21 0533 06/26/21 0526  NA 138 132* 134* 137  K 4.8 4.2 4.1 4.0  CL 107 103 105 108  CO2 '28 22 23 25  '$ GLUCOSE 144* 117* 123* 119*  BUN '19 16 18 19  '$ CREATININE 1.04 1.06 1.10 0.97  CALCIUM 8.8* 9.1 8.4* 8.5*   GFR: Estimated Creatinine Clearance: 105.1 mL/min (by C-G formula based on SCr of 0.97 mg/dL). Liver Function Tests: No results for input(s): AST, ALT, ALKPHOS, BILITOT, PROT, ALBUMIN in the last 168 hours.  No results for input(s): LIPASE, AMYLASE in the last 168 hours.  No results for input(s): AMMONIA in the last 168 hours. Coagulation Profile: Recent Labs  Lab 06/21/21 1124  INR 1.3*   Cardiac Enzymes: No results for input(s): CKTOTAL, CKMB, CKMBINDEX, TROPONINI in the last 168 hours. BNP (last 3 results) No results for input(s): PROBNP in the last 8760 hours. HbA1C: No results for input(s): HGBA1C in the last 72 hours. CBG: No results for input(s): GLUCAP in the last  168 hours. Lipid Profile: No results for input(s): CHOL, HDL, LDLCALC, TRIG, CHOLHDL, LDLDIRECT in the last 72 hours. Thyroid Function Tests: No results for input(s): TSH, T4TOTAL, FREET4, T3FREE, THYROIDAB in the last 72 hours. Anemia Panel: No results for input(s): VITAMINB12, FOLATE, FERRITIN, TIBC, IRON, RETICCTPCT in the last 72 hours. Sepsis Labs: Recent Labs  Lab 06/23/21 0928  PROCALCITON 0.15    Recent Results (from the past 240 hour(s))  Culture, blood (Routine X 2) w Reflex to ID Panel     Status: Abnormal   Collection Time: 06/23/21  9:28 AM   Specimen: BLOOD  Result Value Ref Range Status   Specimen Description   Final    BLOOD  LEFT HAND Performed at [facility],  [address], [place], [place] [address]    Special Requests   Final    BOTTLES DRAWN AEROBIC AND ANAEROBIC Blood Culture adequate volume Performed at [facility], [address] [place], [place] [address]    Culture  Setup Time   Final    Organism ID to follow [place] AEROBIC AND ANAEROBIC BOTTLES CRITICAL RESULT CALLED TO, READ BACK BY AND VERIFIED WITH: JASON ROBBINS @ 0011 ON 06/24/2021.Marland KitchenMarland KitchenTKR GRAM STAIN REVIEWED-AGREE WITH RESULT Performed at [facility], [place] [address], [place], [place] [address]    Culture STAPHYLOCOCCUS AUREUS (A)  Final   Report Status 06/26/2021 FINAL  Final   Organism ID, Bacteria STAPHYLOCOCCUS AUREUS  Final      Susceptibility   Staphylococcus aureus - MIC*    CIPROFLOXACIN <=0.5 SENSITIVE Sensitive     ERYTHROMYCIN <=0.25 SENSITIVE Sensitive     GENTAMICIN <=0.5 SENSITIVE Sensitive     OXACILLIN <=0.25 SENSITIVE Sensitive     TETRACYCLINE <=1 SENSITIVE Sensitive     VANCOMYCIN <=0.5 SENSITIVE Sensitive     TRIMETH/SULFA <=10 SENSITIVE Sensitive     CLINDAMYCIN <=0.25 SENSITIVE Sensitive     RIFAMPIN <=0.5 SENSITIVE Sensitive     Inducible Clindamycin NEGATIVE Sensitive     * STAPHYLOCOCCUS AUREUS  Blood Culture ID Panel (Reflexed)     Status: Abnormal   Collection Time: 06/23/21  9:28 AM  Result Value Ref Range Status   Enterococcus faecalis NOT DETECTED NOT DETECTED Final   Enterococcus Faecium NOT DETECTED NOT DETECTED Final   Listeria monocytogenes NOT DETECTED NOT DETECTED Final   Staphylococcus species DETECTED (A) NOT DETECTED Final    Comment: CRITICAL RESULT CALLED TO, READ BACK BY AND VERIFIED WITH: JASON ROBBINS @ 0011 ON 06/24/2021.Marland KitchenMarland KitchenTKR    Staphylococcus aureus (BCID) DETECTED (A) NOT DETECTED Final    Comment: CRITICAL RESULT CALLED TO, READ BACK BY AND VERIFIED WITH: JASON ROBBINS @ 0011 ON 06/24/2021.Marland KitchenMarland KitchenTKR    Staphylococcus epidermidis NOT DETECTED NOT DETECTED Final   Staphylococcus  lugdunensis NOT DETECTED NOT DETECTED Final   Streptococcus species NOT DETECTED NOT DETECTED Final   Streptococcus agalactiae NOT DETECTED NOT DETECTED Final   Streptococcus pneumoniae NOT DETECTED NOT DETECTED Final   Streptococcus pyogenes NOT DETECTED NOT DETECTED Final   A.calcoaceticus-baumannii NOT DETECTED NOT DETECTED Final   Bacteroides fragilis NOT DETECTED NOT DETECTED Final   Enterobacterales NOT DETECTED NOT DETECTED Final   Enterobacter cloacae complex NOT DETECTED NOT DETECTED Final   Escherichia coli NOT DETECTED NOT DETECTED Final   Klebsiella aerogenes NOT DETECTED NOT DETECTED Final   Klebsiella oxytoca NOT DETECTED NOT DETECTED Final   Klebsiella pneumoniae NOT DETECTED NOT DETECTED Final   Proteus species NOT DETECTED NOT DETECTED  Final   Salmonella species NOT DETECTED NOT DETECTED Final   Serratia marcescens NOT DETECTED NOT DETECTED Final   Haemophilus influenzae NOT DETECTED NOT DETECTED Final   Neisseria meningitidis NOT DETECTED NOT DETECTED Final   Pseudomonas aeruginosa NOT DETECTED NOT DETECTED Final   Stenotrophomonas maltophilia NOT DETECTED NOT DETECTED Final   Candida albicans NOT DETECTED NOT DETECTED Final   Candida auris NOT DETECTED NOT DETECTED Final   Candida glabrata NOT DETECTED NOT DETECTED Final   Candida krusei NOT DETECTED NOT DETECTED Final   Candida parapsilosis NOT DETECTED NOT DETECTED Final   Candida tropicalis NOT DETECTED NOT DETECTED Final   Cryptococcus neoformans/gattii NOT DETECTED NOT DETECTED Final   Meth resistant mecA/C and MREJ NOT DETECTED NOT DETECTED Final    Comment: Performed at [facility], [address] [place], [place] [address]  Culture, blood (Routine X 2) w Reflex to ID Panel     Status: Abnormal (Preliminary result)   Collection Time: 06/23/21  9:34 AM   Specimen: BLOOD RIGHT HAND  Result Value Ref Range Status   Specimen Description   Final    BLOOD RIGHT HAND Performed at [facility], [place] [address], [place], [place] [address]    Special Requests   Final    BOTTLES DRAWN AEROBIC AND ANAEROBIC Blood Culture adequate volume Performed at [facility], [address] [place], [place] [address]    Culture  Setup Time   Final    GRAM POSITIVE COCCI IN BOTH AEROBIC AND ANAEROBIC BOTTLES CRITICAL VALUE NOTED.  VALUE IS CONSISTENT WITH PREVIOUSLY REPORTED AND CALLED VALUE. GRAM STAIN REVIEWED-AGREE WITH RESULT Performed at [facility], [address] [place], [place] [address]    Culture (A)  Final    STAPHYLOCOCCUS AUREUS SUSCEPTIBILITIES PERFORMED ON PREVIOUS CULTURE WITHIN THE LAST 5 DAYS. Performed at [facility], [place] [address], [place], [place] [address]    Report Status PENDING  Incomplete  Culture, blood (Routine X 2) w Reflex to ID Panel     Status: None (Preliminary result)   Collection Time: 06/26/21  5:26 AM   Specimen: BLOOD RIGHT HAND  Result Value Ref Range Status   Specimen Description BLOOD RIGHT HAND  Final   Special Requests   Final    BOTTLES DRAWN AEROBIC AND ANAEROBIC Blood Culture results may not be optimal due to an excessive volume of blood received in culture bottles   Culture   Final    NO GROWTH 1 DAY Performed at [facility], [address], [place], [place] [address]    Report Status PENDING  Incomplete  Culture, blood (Routine X 2) w Reflex to ID Panel     Status: None (Preliminary result)   Collection Time: 06/26/21  5:26 AM   Specimen: BLOOD  Result Value Ref Range Status   Specimen Description BLOOD LEFT HAND  Final   Special Requests   Final    BOTTLES DRAWN AEROBIC AND ANAEROBIC Blood Culture results may not be optimal due to an inadequate volume of blood received in culture bottles   Culture   Final    NO GROWTH 1 DAY Performed at [facility], [address], [place], [place] [address]    Report Status PENDING  Incomplete         Radiology Studies: No results  found.      Scheduled Meds:  amitriptyline  25 mg Oral QHS   carvedilol  6.25 mg Oral  BID WC   diazepam  5 mg Oral BID   fluticasone  2 spray Each Nare Daily   gabapentin  600 mg Oral TID   levothyroxine  100 mcg Oral QAC breakfast   lidocaine  1 patch Transdermal Q24H   pantoprazole  40 mg Oral Daily   sodium chloride flush  3 mL Intravenous Q12H   thiamine  100 mg Oral Daily   tiZANidine  4 mg Oral TID   Continuous Infusions:  sodium chloride Stopped (06/26/21 1434)    ceFAZolin (ANCEF) IV 2 g (06/27/21 0517)     LOS: 14 days       Desma Maxim, MD [facility]   If 7PM-7AM, please contact night-coverage  06/27/2021, 12:14 PM

## 2021-06-27 NOTE — TOC Progression Note (Addendum)
Transition of Care [facility]) - Progression Note    Patient Details  Name: SORREN VALLIER MRN: [id number] Date of Birth: [birth year]  Transition of Care [facility]) CM/SW [place], LCSW Phone Number: 06/27/2021, 12:33 PM  Clinical Narrative:   CSW called Tanya in Admissions at [facility] to confirm they can take patient tomorrow on IV antibiotics. Lavella Lemons stated she will check with their Nurse and call CSW back.   1:55- Tanya with [facility] confirmed they can take patient back on his IV anitbiotics listed in ID note.    Expected Discharge Plan: [place] Barriers to Discharge: Continued Medical Work up  Expected Discharge Plan and Services Expected Discharge Plan: [place] arrangements for the past 2 months: Single Family Home                                       Social Determinants of Health (SDOH) Interventions    Readmission Risk Interventions     View : No data to display.

## 2021-06-28 ENCOUNTER — Inpatient Hospital Stay: Payer: Self-pay

## 2021-06-28 LAB — CULTURE, BLOOD (ROUTINE X 2): Special Requests: ADEQUATE

## 2021-06-28 MED ORDER — HYDROCODONE-ACETAMINOPHEN 5-325 MG PO TABS: 1.0000 | ORAL_TABLET | ORAL | 0 refills | Status: AC | PRN

## 2021-06-28 MED ORDER — CHLORHEXIDINE GLUCONATE CLOTH 2 % EX PADS
6.0000 | MEDICATED_PAD | Freq: Every day | CUTANEOUS | Status: DC
  Administered 2021-06-28: 6 via TOPICAL

## 2021-06-28 MED ORDER — CEFAZOLIN SODIUM-DEXTROSE 2-4 GM/100ML-% IV SOLN
2.0000 g | Freq: Three times a day (TID) | INTRAVENOUS | Status: AC
Start: 2021-06-28 — End: ?

## 2021-06-28 MED ORDER — SODIUM CHLORIDE 0.9% FLUSH: 10.0000 mL | INTRAVENOUS | Status: DC | PRN

## 2021-06-28 MED ORDER — MAGNESIUM 30 MG PO TABS: 30.0000 mg | ORAL_TABLET | Freq: Every day | ORAL | Status: AC

## 2021-06-28 MED ORDER — SODIUM CHLORIDE 0.9% FLUSH
10.0000 mL | Freq: Two times a day (BID) | INTRAVENOUS | Status: DC
  Administered 2021-06-28: 10 mL

## 2021-06-28 MED ORDER — HEPARIN SOD (PORK) LOCK FLUSH 100 UNIT/ML IV SOLN
250.0000 [IU] | INTRAVENOUS | Status: AC | PRN
  Administered 2021-06-28: 250 [IU]
  Filled 2021-06-28 (×2): qty 5

## 2021-06-28 NOTE — Progress Notes (Signed)
Peripherally Inserted Central Catheter Placement  The IV Nurse has discussed with the patient and/or persons authorized to consent for the patient, the purpose of this procedure and the potential benefits and risks involved with this procedure.  The benefits include less needle sticks, lab draws from the catheter, and the patient may be discharged home with the catheter. Risks include, but not limited to, infection, bleeding, blood clot (thrombus formation), and puncture of an artery; nerve damage and irregular heartbeat and possibility to perform a PICC exchange if needed/ordered by physician.  Alternatives to this procedure were also discussed.  Bard Power PICC patient education guide, fact sheet on infection prevention and patient information card has been provided to patient /or left at bedside.    PICC Placement Documentation  PICC Single Lumen 20/23/34 Right Cephalic 42 cm 0 cm (Active)  Indication for Insertion or Continuance of Line Prolonged intravenous therapies 06/28/21 1141  Exposed Catheter (cm) 0 cm 06/28/21 1141  Site Assessment Clean, Dry, Intact 06/28/21 1141  Line Status Flushed;Saline locked;Blood return noted 06/28/21 1141  Dressing Type Transparent;Securing device 06/28/21 1141  Dressing Status Antimicrobial disc in place;Clean, Dry, Intact 06/28/21 1141  Safety Lock Not Applicable 35/68/61 6837  Line Care Connections checked and tightened;Cap changed;Tubing changed 06/28/21 1141  Line Adjustment (NICU/IV Team Only) No 06/28/21 1141  Dressing Intervention New dressing 06/28/21 1141  Dressing Change Due 07/05/21 06/28/21 1141       Rolena Infante 06/28/2021, 11:43 AM

## 2021-06-28 NOTE — Discharge Summary (Addendum)
Shaun Weaver KDT:[id number] DOB: 08-20-1960 DOA: 06/12/2021  PCP: Sofie Hartigan, MD  Admit date: 06/12/2021 Discharge date: 06/28/2021  Time spent: 35 minutes  Recommendations for Outpatient Follow-up:  Antibiotics through 6/21, will need weekly cbc with diff, cmp faxed to dr. Delaine Lame at [phone], and patient will need to follow-up with Dr. Delaine Lame 07/16/21 @ 09:00 Hepatology f/u Neurosurgery f/u    Discharge Diagnoses:  Active Problems:   Chronic bilateral low back pain without sciatica   Sciatica of right side associated with disorder of lumbosacral spine   Hypoxia   Alcoholic cirrhosis of liver (HCC)   Esophageal varices without bleeding (HCC)   Gastro-esophageal reflux disease without esophagitis   Hypothyroidism, unspecified   Hypertension, essential, benign   Thrombocytopenia (HCC)   Right knee pain   MSSA bacteremia   Portal vein thrombosis   Discharge Condition: stable  Diet recommendation: low sodium heart healthy  Filed Weights   06/20/21 0600 06/22/21 0500 06/26/21 0500  Weight: 122.2 kg 123.5 kg 118.9 kg    History of present illness:  From admission h and p Shaun Weaver is a 61 y.o. male with medical history significant of alcoholic liver cirrhosis being followed by [facility], history of alcohol abuse and patient has not had any alcohol since 2005.  Patient coming to [place] with presentations of ambulatory dysfunction and right leg weakness with ascending pain from his foot to his thigh since the last 2 months.  Intermittently patient reports sciatica symptoms going from his back to his buttocks on the right side.  Patient currently denies any pain right now.  Patient also does not report any headaches blurred vision symptoms of syncope.  Hospital Course:  Patient presents ambulatory dysfunction and right leg pain secondary to spinal stenosis with radiculopathy. Neurosurgery consulted. Epidural steroid injection administered. Poor surgical  candidate in present condition; neurosurgery will consider decompression in the future, can follow-up with them in 4-6 weeks. Patient's hospital course complicated by cellulitis/thrombophlebitis of left antecubital fossa from PIV, resulding in MSSA bacteremia. Patient was treated with IV abx. TTE w/o vegetations, ID did not think TEE necessary as this likely hospital acquired from PIV. MRI without signs of lumbar infection. Will need cefazolin through 6/21 and PICC was placed prior to discharge. The cellulitis/thrombophlebitis is resolving. Patient's known cirrhosis was stable and patient should f/u with his liver team.  Procedures: Epidural corticosteroid injection   Consultations: ID, ortho, neurosurgery  Discharge Exam: Vitals:   06/28/21 0240 06/28/21 0843  BP: 118/68 136/63  Pulse: (!) 59 64  Resp: 18 19  Temp: (!) 97.5 F (36.4 C) 98.2 F (36.8 C)  SpO2: 96% 97%    General exam: NAD.  Appears uncomfortable Respiratory system: Left-sided crackles.  Normal work of breathing.  Room air Cardiovascular system: S1-S2, RRR, no murmurs, no pedal edema Gastrointestinal system: Soft, NT/ND, normal bowel sounds Central nervous system: Alert and oriented. No focal neurological deficits. Extremities: Decreased power BLE.  Decreased range of motion skin: induration left antecubital fossa iimproving Psychiatry: Judgement and insight appear normal. Mood & affect appropriate.  Msk: no spinous process tenderness  Discharge Instructions   Discharge Instructions     Diet - low sodium heart healthy   Complete by: As directed    Increase activity slowly   Complete by: As directed       Allergies as of 06/28/2021   No Known Allergies      Medication List     STOP taking these medications  celecoxib 200 MG capsule Commonly known as: CELEBREX   cyclobenzaprine 10 MG tablet Commonly known as: FLEXERIL   nadolol 40 MG tablet Commonly known as: CORGARD   spironolactone 25 MG  tablet Commonly known as: ALDACTONE       TAKE these medications    amitriptyline 25 MG tablet Commonly known as: ELAVIL Take 25 mg by mouth at bedtime as needed for sleep.   carvedilol 6.25 MG tablet Commonly known as: COREG Take 6.25 mg by mouth 2 (two) times daily with a meal.   ceFAZolin 2-4 GM/100ML-% IVPB Commonly known as: ANCEF Inject 100 mLs (2 g total) into the vein every 8 (eight) hours.   ergocalciferol 1.25 MG (50000 UT) capsule Commonly known as: VITAMIN D2 Take 50,000 Units by mouth once a week. Friday   fluticasone 50 MCG/ACT nasal spray Commonly known as: FLONASE Place 2 sprays into both nostrils daily.   furosemide 20 MG tablet Commonly known as: LASIX Take 20 mg by mouth daily.   gabapentin 300 MG capsule Commonly known as: NEURONTIN Take 600 mg by mouth 3 (three) times daily.   HYDROcodone-acetaminophen 5-325 MG tablet Commonly known as: NORCO/VICODIN Take 1 tablet by mouth every 4 (four) hours as needed for severe pain or moderate pain.   levothyroxine 100 MCG tablet Commonly known as: SYNTHROID Take 100 mcg by mouth daily before breakfast.   lidocaine 5 % Commonly known as: Lidoderm Place 1 patch onto the skin every 12 (twelve) hours. Remove & Discard patch within 12 hours or as directed by MD   magnesium 30 MG tablet Take 1 tablet (30 mg total) by mouth daily. What changed: how much to take   mupirocin ointment 2 % Commonly known as: BACTROBAN Apply two times a day for 7 days.   omeprazole 20 MG capsule Commonly known as: PRILOSEC Take 40 mg by mouth at bedtime.   rivaroxaban 10 MG Tabs tablet Commonly known as: XARELTO Take 10 mg by mouth daily.   traZODone 50 MG tablet Commonly known as: DESYREL Take 50 mg by mouth as needed for sleep.       No Known Allergies  Contact information for follow-up providers     Meade Maw, MD Follow up.   Specialty: Neurosurgery Why: call to schedule an appointment in 4-6  weeks Contact information: [place] [address] [phone]         Tsosie Billing, MD Follow up.   Specialty: Infectious Diseases Why: 07/16/2021 @ 09:00 Contact information: [place] [address] [phone]              Contact information for after-discharge care     [facility] .   Service: Skilled Nursing Contact information: [place] [phone]                      The results of significant diagnostics from this hospitalization (including imaging, microbiology, ancillary and laboratory) are listed below for reference.    Significant Diagnostic Studies: MR LUMBAR SPINE WO CONTRAST  Result Date: 06/12/2021 CLINICAL DATA:  Low back pain, cauda equina syndrome suspected EXAM: MRI LUMBAR SPINE WITHOUT CONTRAST TECHNIQUE: Multiplanar, multisequence MR imaging of the lumbar spine was performed. No intravenous contrast was administered. COMPARISON:  Lumbar spine radiograph 04/25/2013, CT 03/14/2014 FINDINGS: Segmentation:  Standard. Alignment:  Physiologic. Vertebrae: No fracture, evidence of discitis, or aggressive bone lesion. Conus medullaris  and cauda equina: Conus extends to the T12 level. Conus and cauda equina appear normal. Paraspinal and other soft tissues: Mild paraspinal muscle atrophy. There is bilateral renal atrophy. There are multiple small renal cysts noted. No other significant abnormality. Disc levels: T12-L1: No significant spinal canal or neural foraminal narrowing. L1-L2: Mild bilateral facet arthropathy. No significant spinal canal or neural foraminal narrowing. L2-L3: Mild bilateral facet arthropathy. No significant spinal canal or neural foraminal narrowing L3-L4: Right greater than left facet arthropathy and ligament flavum hypertrophy results in mild, left greater than right subarticular stenosis. No  impingement. L4-L5: Moderate bilateral facet arthropathy with ligamentum flavum hypertrophy. There is moderate bilateral subarticular stenosis with abutment of a descending right and left L5 nerve roots (series 8, image 31). Endplate and facet spurring results in mild encroachment of the right neural foramen. L5-S1: Moderate disc height loss with asymmetric right disc bulging, ligamentum flavum hypertrophy, and bilateral facet arthropathy. There is severe right sided subarticular stenosis with impingement of the descending right S1 nerve root (series 8, image 36). Moderate right and mild left neural foraminal stenosis. IMPRESSION: Multilevel degenerative changes of the lumbar spine, most significant at L5-S1: Severe right-sided subarticular stenosis at L5-S1 with impingement of the descending right S1 nerve root. Moderate right and mild left neural foraminal stenosis at this level. Moderate bilateral subarticular stenosis at L4-L5 with abutment of the descending L5 nerve roots. Mild encroachment of the right neural foramen without exiting nerve root impingement. Mild, left greater than right subarticular stenosis at L3-L4, but no impingement. Electronically Signed   By: Maurine Simmering M.D.   On: 06/12/2021 16:25   MR Lumbar Spine W Wo Contrast  Result Date: 06/25/2021 CLINICAL DATA:  Bacteremia, recent epidural injection EXAM: MRI LUMBAR SPINE WITHOUT AND WITH CONTRAST TECHNIQUE: Multiplanar and multiecho pulse sequences of the lumbar spine were obtained without and with intravenous contrast. CONTRAST:  29m GADAVIST GADOBUTROL 1 MMOL/ML IV SOLN COMPARISON:  06/12/2021 MRI lumbar spine FINDINGS: Segmentation:  Standard. Alignment:  Physiologic. Vertebrae: No acute fracture, evidence of discitis, or suspicious osseous lesion. No abnormal osseous enhancement. Unchanged appearance of the vertebral bodies compared to 06/12/2021. Conus medullaris and cauda equina: Conus extends to the T12 level. Conus and cauda equina  appear normal. No abnormal enhancement. Paraspinal and other soft tissues: Bilateral renal atrophy and multiple small renal cysts. Disc levels: T12-L1: No significant disc bulge. No spinal canal stenosis or neural foraminal narrowing. L1-L2: No significant disc bulge. Mild facet arthropathy. No spinal canal stenosis or neural foraminal narrowing. L2-L3: No significant disc bulge. Mild facet arthropathy. No spinal canal stenosis or neural foraminal narrowing. L3-L4: No significant disc bulge. Mild facet arthropathy. No spinal canal stenosis or neural foraminal narrowing. L4-L5: No significant disc bulge. Moderate facet arthropathy. Narrowing of the lateral recesses. No spinal canal stenosis or neural foraminal narrowing. L5-S1: Disc height loss with right eccentric disc bulge. Mild facet arthropathy. Disc protrusion contacts the descending right S1 nerve roots. No spinal canal stenosis. Mild right neural foraminal narrowing. IMPRESSION: 1. No evidence of discitis osteomyelitis. No acute osseous abnormality. 2. L5-S1 disc protrusion that contacts the descending right S1 nerve roots. Mild right neural foraminal narrowing at this level. 3. Narrowing of the lateral recesses at L4-L5, which could affect the descending L5 nerve roots. Electronically Signed   By: AMerilyn BabaM.D.   On: 06/25/2021 02:20   CT ABDOMEN PELVIS W CONTRAST  Result Date: 06/13/2021 CLINICAL DATA:  Flank pain, kidney stone suspected Hydronephrosis ? hydronephrosis/ nephrolithiasis  EXAM: CT ABDOMEN AND PELVIS WITH CONTRAST TECHNIQUE: Multidetector CT imaging of the abdomen and pelvis was performed using the standard protocol following bolus administration of intravenous contrast. RADIATION DOSE REDUCTION: This exam was performed according to the departmental dose-optimization program which includes automated exposure control, adjustment of the mA and/or kV according to patient size and/or use of iterative reconstruction technique. CONTRAST:   171m OMNIPAQUE IOHEXOL 300 MG/ML  SOLN COMPARISON:  03/14/2014 FINDINGS: Lower chest: Scarring in the lung bases.  No acute abnormality. Hepatobiliary: Nodular shrunken liver compatible with cirrhosis. No focal hepatic abnormality. Gallbladder unremarkable. Pancreas: No focal abnormality or ductal dilatation. Spleen: Splenomegaly with a craniocaudal length of 14 cm. Adrenals/Urinary Tract: No adrenal abnormality. No focal renal abnormality. No stones or hydronephrosis. Urinary bladder is unremarkable. Stomach/Bowel: Stomach, large and small bowel grossly unremarkable. Vascular/Lymphatic: No evidence of aneurysm or adenopathy. Reproductive: No visible focal abnormality. Other: No free fluid or free air. Small umbilical hernia containing fat. Musculoskeletal: No acute bony abnormality. IMPRESSION: Cirrhosis with associated splenomegaly. No acute findings in the abdomen or pelvis. Small umbilical hernia containing fat. Electronically Signed   By: KRolm BaptiseM.D.   On: 06/13/2021 00:07   [place] Img Lower Bilateral (DVT)  Result Date: 06/12/2021 CLINICAL DATA:  Leg pain and edema EXAM: BILATERAL LOWER EXTREMITY VENOUS DOPPLER ULTRASOUND TECHNIQUE: Gray-scale sonography with compression, as well as color and duplex ultrasound, were performed to evaluate the deep venous system(s) from the level of the common femoral vein through the popliteal and proximal calf veins. COMPARISON:  None Available. FINDINGS: VENOUS Normal compressibility of the common femoral, superficial femoral, and popliteal veins, as well as the visualized calf veins. Visualized portions of profunda femoral vein and great saphenous vein unremarkable. No filling defects to suggest DVT on grayscale or color Doppler imaging. Doppler waveforms show normal direction of venous flow, normal respiratory plasticity and response to augmentation. OTHER None. Limitations: none IMPRESSION: 1. No evidence of deep venous thrombosis within either lower  extremity. Electronically Signed   By: MRanda NgoM.D.   On: 06/12/2021 23:57   [place] Img Upper Uni Left (DVT)  Result Date: 06/24/2021 CLINICAL DATA:  Erythema and swelling near peripheral IV site for the past week. Evaluate for DVT. EXAM: LEFT UPPER EXTREMITY VENOUS DOPPLER ULTRASOUND TECHNIQUE: Gray-scale sonography with graded compression, as well as color Doppler and duplex ultrasound were performed to evaluate the upper extremity deep venous system from the level of the subclavian vein and including the jugular, axillary, basilic, radial, ulnar and upper cephalic vein. Spectral Doppler was utilized to evaluate flow at rest and with distal augmentation maneuvers. COMPARISON:  None Available. FINDINGS: Contralateral Subclavian Vein: Respiratory phasicity is normal and symmetric with the symptomatic side. No evidence of thrombus. Normal compressibility. Internal Jugular Vein: No evidence of thrombus. Normal compressibility, respiratory phasicity and response to augmentation. Subclavian Vein: No evidence of thrombus. Normal compressibility, respiratory phasicity and response to augmentation. Axillary Vein: No evidence of thrombus. Normal compressibility, respiratory phasicity and response to augmentation. Cephalic Vein: While the proximal aspect of the left cephalic vein appears widely patent (images 13 and 14), there is mixed echogenic occlusive thrombus involving the distal aspect of the left cephalic vein at the level of the antecubital fossa, reportedly at the location of reported peripheral IV (images 15, 18 and 19). Basilic Vein: No evidence of thrombus. Normal compressibility, respiratory phasicity and response to augmentation. Brachial Veins: No evidence of thrombus. Normal compressibility, respiratory phasicity and response to augmentation. Radial Veins:  No evidence of thrombus. Normal compressibility, respiratory phasicity and response to augmentation. Ulnar Veins: No evidence of thrombus.  Normal compressibility, respiratory phasicity and response to augmentation. Other Findings:  None visualized. IMPRESSION: 1. No evidence of DVT within the left upper extremity. 2. Examination is positive for short-segment occlusive superficial thrombophlebitis involving the cephalic vein at the level of the antecubital fossa, reportedly at the location of prior peripheral IV. Electronically Signed   By: Sandi Mariscal M.D.   On: 06/24/2021 07:54   DG Chest Port 1 View  Result Date: 06/23/2021 CLINICAL DATA:  Fever.  Possible pneumonia. EXAM: PORTABLE CHEST 1 VIEW COMPARISON:  Chest x-ray December 14, 2010. FINDINGS: Mild lateral left basilar airspace opacity. No visible pleural effusions or pneumothorax. Similar cardiomediastinal silhouette, probably accentuated by AP technique. No displaced fracture. IMPRESSION: Mild lateral left basilar airspace opacity, potentially pneumonia. Electronically Signed   By: Margaretha Sheffield M.D.   On: 06/23/2021 10:29   DG Knee Complete 4 Views Right  Result Date: 06/15/2021 CLINICAL DATA:  Pain EXAM: RIGHT KNEE - COMPLETE 4 VIEW COMPARISON:  None Available. FINDINGS: No recent fracture or dislocation is seen. There is no effusion. Degenerative changes are noted with bony spurs in medial, lateral and patellofemoral compartments. IMPRESSION: No recent fracture or dislocation is seen. There is no significant effusion. Moderate degenerative changes are noted. Electronically Signed   By: Elmer Picker M.D.   On: 06/15/2021 17:19   IR INJECT DIAG/THERA/INC NEEDLE/CATH/PLC EPI/LUMB/SAC W/IMG  Result Date: 06/16/2021 CLINICAL DATA:  61 year old male with lumbosacral spondylosis without myelopathy. He has severe low back pain and right L5 and S1 radiculopathy. He presents for epidural steroid injection. FLUOROSCOPY: Radiation Exposure Index (as provided by the fluoroscopic device): 14.1 mGy Kerma PROCEDURE: The procedure, risks, benefits, and alternatives were explained to  the patient. Questions regarding the procedure were encouraged and answered. The patient understands and consents to the procedure. LUMBAR EPIDURAL INJECTION: An interlaminar approach was performed on right at L5-S1. The overlying skin was cleansed and anesthetized. A 20 gauge epidural needle was advanced using loss-of-resistance technique. DIAGNOSTIC EPIDURAL INJECTION: Injection of Isovue-M 200 shows a good epidural pattern with spread above and below the level of needle placement, primarily on the right no vascular opacification is seen. THERAPEUTIC EPIDURAL INJECTION: Eighty mg of Depo-Medrol mixed with 2 mL 1% lidocaine were instilled. The procedure was well-tolerated, and the patient was discharged thirty minutes following the injection in good condition. COMPLICATIONS: None. IMPRESSION: Technically successful epidural injection on the right L5-S1 #1. Electronically Signed   By: Jacqulynn Cadet M.D.   On: 06/16/2021 14:12   ECHOCARDIOGRAM COMPLETE  Result Date: 06/24/2021    ECHOCARDIOGRAM REPORT   Patient Name:   STANLEY LYNESS Date of Exam: 06/24/2021 Medical Rec #:  [id number]    Height:       72.0 in Accession #:    [id number]   Weight:       272.3 lb Date of Birth:  Sep 05, 1960     BSA:          2.429 m Patient Age:    5 years     BP:           138/70 mmHg Patient Gender: M            HR:           86 bpm. Exam Location:  [facility] Procedure: 2D Echo, Cardiac Doppler and Color Doppler Indications:     Bacteremia R78.81  History:  Patient has no prior history of Echocardiogram examinations.                  Risk Factors:Hypertension. DVT.  Sonographer:     Sherrie Sport Referring Phys:  [id number] [place] L FOUST Diagnosing Phys: Kate Sable MD  Sonographer Comments: Technically challenging study due to limited acoustic windows, suboptimal subcostal window and no apical window. IMPRESSIONS  1. Left ventricular ejection fraction, by estimation, is 60 to 65%. The left ventricle has normal function. The left  ventricle has no regional wall motion abnormalities. There is mild left ventricular hypertrophy. Left ventricular diastolic function could not be evaluated.  2. Right ventricular systolic function is normal. The right ventricular size is not well visualized.  3. The mitral valve is normal in structure. No evidence of mitral valve regurgitation.  4. The aortic valve is tricuspid. Aortic valve regurgitation is not visualized. FINDINGS  Left Ventricle: Left ventricular ejection fraction, by estimation, is 60 to 65%. The left ventricle has normal function. The left ventricle has no regional wall motion abnormalities. The left ventricular internal cavity size was normal in size. There is  mild left ventricular hypertrophy. Left ventricular diastolic function could not be evaluated. Right Ventricle: The right ventricular size is not well visualized. No increase in right ventricular wall thickness. Right ventricular systolic function is normal. Left Atrium: Left atrial size was normal in size. Right Atrium: Right atrial size was not well visualized. Pericardium: There is no evidence of pericardial effusion. Mitral Valve: The mitral valve is normal in structure. No evidence of mitral valve regurgitation. Tricuspid Valve: The tricuspid valve is normal in structure. Tricuspid valve regurgitation is not demonstrated. Aortic Valve: The aortic valve is tricuspid. Aortic valve regurgitation is not visualized. Pulmonic Valve: The pulmonic valve was not well visualized. Pulmonic valve regurgitation is not visualized. Aorta: The aortic root is normal in size and structure. Venous: The inferior vena cava was not well visualized. IAS/Shunts: No atrial level shunt detected by color flow Doppler.  LEFT VENTRICLE PLAX 2D LVIDd:         5.10 cm LVIDs:         3.40 cm LV PW:         1.30 cm LV IVS:        1.40 cm  LEFT ATRIUM         Index LA diam:    4.30 cm 1.77 cm/m                        PULMONIC VALVE AORTA                 PV Vmax:         0.83 m/s Ao Root diam: 3.50 cm PV Vmean:       49.600 cm/s                       PV VTI:         0.142 m                       PV Peak grad:   2.7 mmHg                       PV Mean grad:   1.0 mmHg                       RVOT Peak grad:  4 mmHg   SHUNTS Pulmonic VTI: 0.182 m Kate Sable MD Electronically signed by Kate Sable MD Signature Date/Time: 06/24/2021/5:07:09 PM    Final    [place] EKG SITE RITE  Result Date: 06/28/2021 If Site Rite image not attached, placement could not be confirmed due to current cardiac rhythm.  US Abdomen Limited RUQ (LIVER/GB)  Result Date: 06/12/2021 CLINICAL DATA:  Upper abdominal pain EXAM: ULTRASOUND ABDOMEN LIMITED RIGHT UPPER QUADRANT COMPARISON:  None Available. FINDINGS: Gallbladder: Gallbladder is mildly decompressed with mild wall thickening to 4 mm. This may be in part due to lack of distension. Negative sonographic Murphy's sign is elicited. Common bile duct: Diameter: 8.5 mm without evidence of intrahepatic ductal dilatation. Liver: Nodularity is noted without focal mass. Portal vein is patent on color Doppler imaging with normal direction of blood flow towards the liver. Other: None. IMPRESSION: Cirrhotic changes of the liver without focal mass. Mildly prominent gallbladder wall thickening at 4 mm within negative sonographic Murphy's sign. These changes are likely related to incomplete distension. Mildly prominent common bile duct at 8.5 mm. No intrahepatic ductal dilatation is noted. Correlate with laboratory values. Electronically Signed   By: Inez Catalina M.D.   On: 06/12/2021 22:33    Microbiology: Recent Results (from the past 240 hour(s))  Culture, blood (Routine X 2) w Reflex to ID Panel     Status: Abnormal   Collection Time: 06/23/21  9:28 AM   Specimen: BLOOD  Result Value Ref Range Status   Specimen Description   Final    BLOOD  LEFT HAND Performed at [facility], [address] [place], [place] [address]    Special  Requests   Final    BOTTLES DRAWN AEROBIC AND ANAEROBIC Blood Culture adequate volume Performed at [facility], [address] [place], [place] [address]    Culture  Setup Time   Final    Organism ID to follow [place] AEROBIC AND ANAEROBIC BOTTLES CRITICAL RESULT CALLED TO, READ BACK BY AND VERIFIED WITH: JASON ROBBINS @ 0011 ON 06/24/2021.Marland KitchenMarland KitchenTKR GRAM STAIN REVIEWED-AGREE WITH RESULT Performed at [facility], [place] [address] [place], [place] [address]    Culture STAPHYLOCOCCUS AUREUS (A)  Final   Report Status 06/26/2021 FINAL  Final   Organism ID, Bacteria STAPHYLOCOCCUS AUREUS  Final      Susceptibility   Staphylococcus aureus - MIC*    CIPROFLOXACIN <=0.5 SENSITIVE Sensitive     ERYTHROMYCIN <=0.25 SENSITIVE Sensitive     GENTAMICIN <=0.5 SENSITIVE Sensitive     OXACILLIN <=0.25 SENSITIVE Sensitive     TETRACYCLINE <=1 SENSITIVE Sensitive     VANCOMYCIN <=0.5 SENSITIVE Sensitive     TRIMETH/SULFA <=10 SENSITIVE Sensitive     CLINDAMYCIN <=0.25 SENSITIVE Sensitive     RIFAMPIN <=0.5 SENSITIVE Sensitive     Inducible Clindamycin NEGATIVE Sensitive     * STAPHYLOCOCCUS AUREUS  Blood Culture ID Panel (Reflexed)     Status: Abnormal   Collection Time: 06/23/21  9:28 AM  Result Value Ref Range Status   Enterococcus faecalis NOT DETECTED NOT DETECTED Final   Enterococcus Faecium NOT DETECTED NOT DETECTED Final   Listeria monocytogenes NOT DETECTED NOT DETECTED Final   Staphylococcus species DETECTED (A) NOT DETECTED Final    Comment: CRITICAL RESULT CALLED TO, READ BACK BY AND VERIFIED WITH: JASON ROBBINS @ 0011 ON 06/24/2021.Marland KitchenMarland KitchenTKR    Staphylococcus aureus (BCID) DETECTED (A) NOT DETECTED Final    Comment: CRITICAL RESULT CALLED TO, READ  BACK BY AND VERIFIED WITH: JASON ROBBINS @ 0011 ON 06/24/2021.Marland KitchenMarland KitchenTKR    Staphylococcus epidermidis NOT DETECTED NOT DETECTED Final   Staphylococcus lugdunensis NOT DETECTED NOT DETECTED Final   Streptococcus  species NOT DETECTED NOT DETECTED Final   Streptococcus agalactiae NOT DETECTED NOT DETECTED Final   Streptococcus pneumoniae NOT DETECTED NOT DETECTED Final   Streptococcus pyogenes NOT DETECTED NOT DETECTED Final   A.calcoaceticus-baumannii NOT DETECTED NOT DETECTED Final   Bacteroides fragilis NOT DETECTED NOT DETECTED Final   Enterobacterales NOT DETECTED NOT DETECTED Final   Enterobacter cloacae complex NOT DETECTED NOT DETECTED Final   Escherichia coli NOT DETECTED NOT DETECTED Final   Klebsiella aerogenes NOT DETECTED NOT DETECTED Final   Klebsiella oxytoca NOT DETECTED NOT DETECTED Final   Klebsiella pneumoniae NOT DETECTED NOT DETECTED Final   Proteus species NOT DETECTED NOT DETECTED Final   Salmonella species NOT DETECTED NOT DETECTED Final   Serratia marcescens NOT DETECTED NOT DETECTED Final   Haemophilus influenzae NOT DETECTED NOT DETECTED Final   Neisseria meningitidis NOT DETECTED NOT DETECTED Final   Pseudomonas aeruginosa NOT DETECTED NOT DETECTED Final   Stenotrophomonas maltophilia NOT DETECTED NOT DETECTED Final   Candida albicans NOT DETECTED NOT DETECTED Final   Candida auris NOT DETECTED NOT DETECTED Final   Candida glabrata NOT DETECTED NOT DETECTED Final   Candida krusei NOT DETECTED NOT DETECTED Final   Candida parapsilosis NOT DETECTED NOT DETECTED Final   Candida tropicalis NOT DETECTED NOT DETECTED Final   Cryptococcus neoformans/gattii NOT DETECTED NOT DETECTED Final   Meth resistant mecA/C and MREJ NOT DETECTED NOT DETECTED Final    Comment: Performed at [facility], [address] [place], [place] [address]  Culture, blood (Routine X 2) w Reflex to ID Panel     Status: Abnormal   Collection Time: 06/23/21  9:34 AM   Specimen: BLOOD RIGHT HAND  Result Value Ref Range Status   Specimen Description   Final    BLOOD RIGHT HAND Performed at [facility], [address] [place], [place] [address]    Special Requests   Final     BOTTLES DRAWN AEROBIC AND ANAEROBIC Blood Culture adequate volume Performed at [facility], [address] [place], [place] [address]    Culture  Setup Time   Final    GRAM POSITIVE COCCI IN BOTH AEROBIC AND ANAEROBIC BOTTLES CRITICAL VALUE NOTED.  VALUE IS CONSISTENT WITH PREVIOUSLY REPORTED AND CALLED VALUE. GRAM STAIN REVIEWED-AGREE WITH RESULT Performed at [facility], [address] [place], [place] [address]    Culture (A)  Final    STAPHYLOCOCCUS AUREUS SUSCEPTIBILITIES PERFORMED ON PREVIOUS CULTURE WITHIN THE LAST 5 DAYS. Performed at [facility], [address] [place], [place] [address]    Report Status 06/28/2021 FINAL  Final  Culture, blood (Routine X 2) w Reflex to ID Panel     Status: None (Preliminary result)   Collection Time: 06/26/21  5:26 AM   Specimen: BLOOD RIGHT HAND  Result Value Ref Range Status   Specimen Description BLOOD RIGHT HAND  Final   Special Requests   Final    BOTTLES DRAWN AEROBIC AND ANAEROBIC Blood Culture results may not be optimal due to an excessive volume of blood received in culture bottles   Culture   Final    NO GROWTH 2 DAYS Performed at [facility], [address], [place], [place] [address]    Report Status PENDING  Incomplete  Culture,  blood (Routine X 2) w Reflex to ID Panel     Status: None (Preliminary result)   Collection Time: 06/26/21  5:26 AM   Specimen: BLOOD  Result Value Ref Range Status   Specimen Description BLOOD LEFT HAND  Final   Special Requests   Final    BOTTLES DRAWN AEROBIC AND ANAEROBIC Blood Culture results may not be optimal due to an inadequate volume of blood received in culture bottles   Culture   Final    NO GROWTH 2 DAYS Performed at [facility], [address] [place], [place] [address]    Report Status PENDING  Incomplete     Labs: Basic Metabolic Panel: Recent Labs  Lab 06/23/21 0928 06/25/21 0533 06/26/21 0526  NA 132* 134* 137  K  4.2 4.1 4.0  CL 103 105 108  CO2 '22 23 25  '$ GLUCOSE 117* 123* 119*  BUN '16 18 19  '$ CREATININE 1.06 1.10 0.97  CALCIUM 9.1 8.4* 8.5*   Liver Function Tests: No results for input(s): AST, ALT, ALKPHOS, BILITOT, PROT, ALBUMIN in the last 168 hours. No results for input(s): LIPASE, AMYLASE in the last 168 hours. No results for input(s): AMMONIA in the last 168 hours. CBC: Recent Labs  Lab 06/23/21 0928 06/25/21 0533 06/26/21 0526  WBC 4.3 3.6* 4.1  NEUTROABS 3.3  --   --   HGB 13.8 12.7* 12.7*  HCT 42.1 38.5* 38.9*  MCV 94.0 92.8 93.1  PLT 46* 41* 48*   Cardiac Enzymes: No results for input(s): CKTOTAL, CKMB, CKMBINDEX, TROPONINI in the last 168 hours. BNP: BNP (last 3 results) No results for input(s): BNP in the last 8760 hours.  ProBNP (last 3 results) No results for input(s): PROBNP in the last 8760 hours.  CBG: No results for input(s): GLUCAP in the last 168 hours.     Signed:  Desma Maxim MD.  [facility] 06/28/2021, 12:19 PM

## 2021-06-28 NOTE — Progress Notes (Signed)
Pt left a bag of clothes on the floor. Pt was called in and said he will inform his sister to pick up the bag at her convenient time, informed him to pick up the bag at the nurse's station.  Bag was kept by secretary at a cabinet near the secretary's table.

## 2021-06-28 NOTE — TOC Transition Note (Addendum)
Transition of Care [facility]) - CM/SW Discharge Note   Patient Details  Name: Shaun Weaver MRN: [id number] Date of Birth: 24-Nov-1960  [facility] of [facility] CM/SW Contact:  Magnus Ivan, LCSW Phone Number: 06/28/2021, 11:07 AM   Clinical Narrative:   Discharge to [facility] today. Room 16B. Confirmed with Admissions Worker Tanya. Updated MD, RN, patient, and sister Blanch Media. Blanch Media had questions for MD, notified MD. Asked RN to call report once DC Summary is completed. EMS paperwork completed. Will call for EMS when patient is ready for transport.   12:30- RN and MD confirmed patient is ready for transport to [facility]. CSW called [facility] and added patient to list, patient is next on list. RN notified.     Final next level of care: Skilled Nursing Facility Barriers to Discharge: Barriers Resolved   Patient Goals and CMS Choice Patient states their goals for this hospitalization and ongoing recovery are:: SNF CMS Medicare.gov Compare Post Acute Care list provided to:: Patient Choice offered to / list presented to : Patient, Sibling  Discharge Placement              Patient chooses bed at: [facility] Patient to be transferred to facility by: [facility] Name of family member notified: patient and sister Zenovia Jarred notified Patient and family notified of of transfer: 06/28/21  Discharge Plan and Services                                     Social Determinants of Health (SDOH) Interventions     Readmission Risk Interventions     View : No data to display.

## 2021-06-28 NOTE — Progress Notes (Signed)
Pt has a DC order after PICC line was placed on R upper arm. Report was called in and spoke to [employer]. EMS came by and all his belongings were given to pt in a bag.

## 2021-07-01 LAB — CULTURE, BLOOD (ROUTINE X 2)
Culture: NO GROWTH
Culture: NO GROWTH

## 2021-07-16 ENCOUNTER — Encounter: Payer: Self-pay | Admitting: Infectious Diseases

## 2021-07-16 ENCOUNTER — Telehealth: Payer: Self-pay

## 2021-07-16 ENCOUNTER — Ambulatory Visit: Payer: BC Managed Care – PPO | Attending: Infectious Diseases | Admitting: Infectious Diseases

## 2021-07-16 VITALS — BP 122/78 | HR 71 | Temp 97.3°F

## 2021-07-16 DIAGNOSIS — R7881 Bacteremia: Secondary | ICD-10-CM | POA: Diagnosis not present

## 2021-07-16 DIAGNOSIS — R161 Splenomegaly, not elsewhere classified: Secondary | ICD-10-CM | POA: Diagnosis not present

## 2021-07-16 DIAGNOSIS — B9561 Methicillin susceptible Staphylococcus aureus infection as the cause of diseases classified elsewhere: Secondary | ICD-10-CM | POA: Insufficient documentation

## 2021-07-16 DIAGNOSIS — E785 Hyperlipidemia, unspecified: Secondary | ICD-10-CM | POA: Diagnosis not present

## 2021-07-16 DIAGNOSIS — K7460 Unspecified cirrhosis of liver: Secondary | ICD-10-CM | POA: Diagnosis not present

## 2021-07-16 DIAGNOSIS — Z86718 Personal history of other venous thrombosis and embolism: Secondary | ICD-10-CM | POA: Diagnosis not present

## 2021-07-16 DIAGNOSIS — K760 Fatty (change of) liver, not elsewhere classified: Secondary | ICD-10-CM | POA: Diagnosis not present

## 2021-07-16 DIAGNOSIS — Z792 Long term (current) use of antibiotics: Secondary | ICD-10-CM | POA: Insufficient documentation

## 2021-07-16 DIAGNOSIS — M5431 Sciatica, right side: Secondary | ICD-10-CM | POA: Insufficient documentation

## 2021-07-16 DIAGNOSIS — I803 Phlebitis and thrombophlebitis of lower extremities, unspecified: Secondary | ICD-10-CM | POA: Diagnosis not present

## 2021-07-16 DIAGNOSIS — I809 Phlebitis and thrombophlebitis of unspecified site: Secondary | ICD-10-CM | POA: Diagnosis present

## 2021-07-16 DIAGNOSIS — I10 Essential (primary) hypertension: Secondary | ICD-10-CM | POA: Insufficient documentation

## 2021-07-16 NOTE — Progress Notes (Signed)
NAME: Shaun Weaver  DOB: 07-20-1960  MRN: [id number]  Date/Time: 07/16/2021 8:54 AM   Subjective:   ? Shaun Weaver is a 61 y.o. 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 Complains of severe pain in the right side He wants to get the second dose He saw Dr. [place] Mt wanted for his antibiotics to complete.  Past Medical History:  Diagnosis Date   Anxiety    Arthritis    Back pain    Cirrhosis, alcoholic ([place])    Coronary artery disease    Deep vein thrombosis of portal vein 2014   [facility]   Environmental allergies    GERD (gastroesophageal reflux disease)    H/O alcohol abuse    Hypertension    Hypothyroidism    Thyroid disease     Past Surgical History:  Procedure Laterality Date   ANKLE FRACTURE SURGERY Right    EYE SURGERY Right    Cataract Extraction with lens implant   FRACTURE SURGERY Bilateral    pinning right ankle   IR INJECT/THERA/INC NEEDLE/CATH/PLC EPI/LUMB/SAC W/IMG  06/16/2021   KNEE ARTHROSCOPY Left 02/26/2015   Procedure: ARTHROSCOPY Left  KNEE partial medial menisectomy;  Surgeon: Leanor Kail, MD;  Location: [facility] ORS;  Service: Orthopedics;  Laterality: Left;   KNEE ARTHROSCOPY Right 03/31/2015   Procedure: ARTHROSCOPY KNEE;  Surgeon: Leanor Kail, MD;  Location: [facility] ORS;  Service: Orthopedics;  Laterality: Right;    Social History   Socioeconomic History   Marital status: Married    Spouse name: Not on file   Number of children: Not on file   Years of education: Not on file   Highest education level: Not on file  Occupational History   Not on file  Tobacco Use   Smoking status: Never   Smokeless tobacco: Never  Vaping Use   Vaping Use: Never used  Substance and Sexual Activity   Alcohol use: No   Drug use:  No   Sexual activity: Not on file  Other Topics Concern   Not on file  Social History Narrative   Not on file   Social Determinants of Health   Financial Resource Strain: Not on file  Food Insecurity: Not on file  Transportation Needs: Not on file  Physical Activity: Not on file  Stress: Not on file  Social Connections: Not on file  Intimate Partner Violence: Not on file    No family history on file. No Known Allergies I? Current Outpatient Medications  Medication Sig Dispense Refill   amitriptyline (ELAVIL) 25 MG tablet Take 25 mg by mouth at bedtime as needed for sleep.     carvedilol (COREG) 6.25 MG tablet Take 6.25 mg by mouth 2 (two) times daily with a meal.     ceFAZolin (ANCEF) 2-4 GM/100ML-% IVPB Inject 100 mLs (2 g total) into the vein every 8 (eight) hours. 1 each    ergocalciferol (VITAMIN D2) 50000 units capsule Take 50,000 Units by mouth once a week. Friday     fluticasone (FLONASE) 50 MCG/ACT nasal spray Place 2 sprays into both nostrils daily.     furosemide (LASIX) 20 MG tablet Take 20 mg by mouth daily.     gabapentin (NEURONTIN) 300 MG capsule Take 600 mg by mouth 3 (three) times daily.  HYDROcodone-acetaminophen (NORCO/VICODIN) 5-325 MG tablet Take 1 tablet by mouth every 4 (four) hours as needed for severe pain or moderate pain. 30 tablet 0   levothyroxine (SYNTHROID, LEVOTHROID) 100 MCG tablet Take 100 mcg by mouth daily before breakfast.     lidocaine (LIDODERM) 5 % Place 1 patch onto the skin every 12 (twelve) hours. Remove & Discard patch within 12 hours or as directed by MD 60 patch 11   magnesium 30 MG tablet Take 1 tablet (30 mg total) by mouth daily.     mupirocin ointment (BACTROBAN) 2 % Apply two times a day for 7 days. 22 g 0   omeprazole (PRILOSEC) 40 MG capsule Take 1 tablet by mouth daily.     rivaroxaban (XARELTO) 10 MG TABS tablet Take 10 mg by mouth daily.     traZODone (DESYREL) 50 MG tablet Take 50 mg by mouth as needed for sleep.     No  current facility-administered medications for this visit.     Abtx:  Anti-infectives (From admission, onward)    None       REVIEW OF SYSTEMS:  Const: negative fever, negative chills, negative weight loss Eyes: negative diplopia or visual changes, negative eye pain ENT: negative coryza, negative sore throat Resp: negative cough, hemoptysis, dyspnea Cards: negative for chest pain, palpitations, lower extremity edema GU: negative for frequency, dysuria and hematuria GI: Negative for abdominal pain, diarrhea, bleeding, constipation Skin: negative for rash and pruritus Heme: negative for easy bruising and gum/nose bleeding MS: Right lower back pain and right leg pain  Neurolo:negative for headaches, dizziness, vertigo, memory problems  Psych:  anxiety, depression  Endocrine: negative for thyroid, diabetes Allergy/Immunology- negative for any medication or food allergies  Objective:  VITALS:  BP 122/78   Pulse 71   Temp (!) 97.3 F (36.3 C) (Temporal)  LDA PICC line PHYSICAL EXAM:  General: Alert, cooperative, no distress, appears stated age.  Head: Normocephalic, without obvious abnormality, atraumatic. Eyes: Conjunctivae clear, anicteric sclerae. Pupils are equal ENT Nares normal. No drainage or sinus tenderness. Lips, mucosa, and tongue normal. No Thrush Neck: Supple, symmetrical, no adenopathy, thyroid: non tender no carotid bruit and no JVD. Back: No CVA tenderness. Lungs: Clear to auscultation bilaterally. No Wheezing or Rhonchi. No rales. Heart: Regular rate and rhythm, no murmur, rub or gallop. Abdomen: Did not examine  extremities: atraumatic, no cyanosis. No edema. No clubbing Skin: No rashes or lesions. Or bruising Lymph: Cervical, supraclavicular normal. Neurologic: Not assessed.  Patient in wheelchair Pertinent Labs This weeks lab from nursing home is pending   ? Impression/Recommendation MSSA bacteremia 10 days after hospitalization..  Source was the  left cubital thrombophlebitis.  Patient had an epidural steroid injection on 06/16/2021 at L4-L5.  There was no abscess or discitis or any infection on the repeat MRI on 5/25. Repeat blood cultures were negative Patient is getting 4 weeks of IV cefazolin. He will finish on 07/22/2021.  Right-sided sciatica Local injection can be given while on antibiotic as it would cover it as well  Cirrhosis of liver with splenomegaly.  History of portal vein thrombosis History of esophageal varices.  Discussed the management with the patient and his sister PICC line can be removed once he completes antibiotic. Follow-up as needed We will communicate with Dr. Alba Destine.  Sent a message through epic ? ? ___________________________________________________ Discussed with patient, requesting provider Note:  This document was prepared using Dragon voice recognition software and may include unintentional dictation errors.

## 2021-07-16 NOTE — Telephone Encounter (Signed)
Received call today from Kathlee Nations with Dr. Alba Destine office at [facility] regarding clearance to get Epidural steroid injection. Per Kathlee Nations Dr. Alba Destine will need clearance before they can do injection due to being on antibiotics.  Would like patient off antibiotics one week before doing injection. Will forward message to MD to advise on clearance.  P: [place], RMA

## 2021-07-16 NOTE — Telephone Encounter (Signed)
Per DR. Ravishankar;  Can you let them know that he can get the injection while on IV antibiotic- I sent an outside message thru Epic to [place] with my cell phone number- [phone]  Will update Kathlee Nations tomorrow once office opens.  Leatrice Jewels, RMA

## 2021-07-17 NOTE — Telephone Encounter (Signed)
Attempted to call Shaun Weaver back to relay providers message this morning. Not able to reach her at this time. Left message with staff who will update Dr. Alba Destine. Leatrice Jewels, RMA

## 2021-07-28 ENCOUNTER — Encounter: Payer: Self-pay | Admitting: Oncology

## 2021-07-28 ENCOUNTER — Inpatient Hospital Stay: Payer: BC Managed Care – PPO | Attending: Oncology | Admitting: Oncology

## 2021-07-28 ENCOUNTER — Telehealth: Payer: Self-pay

## 2021-07-28 ENCOUNTER — Inpatient Hospital Stay: Payer: BC Managed Care – PPO

## 2021-07-28 VITALS — BP 143/86 | HR 97 | Temp 96.9°F | Ht 72.0 in | Wt 256.0 lb

## 2021-07-28 DIAGNOSIS — K7030 Alcoholic cirrhosis of liver without ascites: Secondary | ICD-10-CM | POA: Insufficient documentation

## 2021-07-28 DIAGNOSIS — D61818 Other pancytopenia: Secondary | ICD-10-CM | POA: Insufficient documentation

## 2021-07-28 DIAGNOSIS — I10 Essential (primary) hypertension: Secondary | ICD-10-CM | POA: Diagnosis not present

## 2021-07-28 DIAGNOSIS — D649 Anemia, unspecified: Secondary | ICD-10-CM

## 2021-07-28 DIAGNOSIS — D696 Thrombocytopenia, unspecified: Secondary | ICD-10-CM | POA: Diagnosis not present

## 2021-07-28 DIAGNOSIS — R161 Splenomegaly, not elsewhere classified: Secondary | ICD-10-CM | POA: Insufficient documentation

## 2021-07-28 LAB — CBC WITH DIFFERENTIAL/PLATELET
Abs Immature Granulocytes: 0.02 10*3/uL (ref 0.00–0.07)
Basophils Absolute: 0 10*3/uL (ref 0.0–0.1)
Basophils Relative: 1 %
Eosinophils Absolute: 0.1 10*3/uL (ref 0.0–0.5)
Eosinophils Relative: 3 %
HCT: 41.6 % (ref 39.0–52.0)
Hemoglobin: 13.9 g/dL (ref 13.0–17.0)
Immature Granulocytes: 1 %
Lymphocytes Relative: 22 %
Lymphs Abs: 0.9 10*3/uL (ref 0.7–4.0)
MCH: 32 pg (ref 26.0–34.0)
MCHC: 33.4 g/dL (ref 30.0–36.0)
MCV: 95.9 fL (ref 80.0–100.0)
Monocytes Absolute: 0.5 10*3/uL (ref 0.1–1.0)
Monocytes Relative: 12 %
Neutro Abs: 2.5 10*3/uL (ref 1.7–7.7)
Neutrophils Relative %: 61 %
Platelets: 94 10*3/uL — ABNORMAL LOW (ref 150–400)
RBC: 4.34 MIL/uL (ref 4.22–5.81)
RDW: 14.7 % (ref 11.5–15.5)
WBC: 4.1 10*3/uL (ref 4.0–10.5)
nRBC: 0 % (ref 0.0–0.2)

## 2021-07-28 LAB — COMPREHENSIVE METABOLIC PANEL
ALT: 10 U/L (ref 0–44)
AST: 32 U/L (ref 15–41)
Albumin: 3.2 g/dL — ABNORMAL LOW (ref 3.5–5.0)
Alkaline Phosphatase: 73 U/L (ref 38–126)
Anion gap: 6 (ref 5–15)
BUN: 10 mg/dL (ref 8–23)
CO2: 27 mmol/L (ref 22–32)
Calcium: 8.9 mg/dL (ref 8.9–10.3)
Chloride: 102 mmol/L (ref 98–111)
Creatinine, Ser: 1.02 mg/dL (ref 0.61–1.24)
GFR, Estimated: >60 mL/min (ref >60)
Glucose, Bld: 85 mg/dL (ref 70–99)
Potassium: 3.6 mmol/L (ref 3.5–5.1)
Sodium: 135 mmol/L (ref 135–145)
Total Bilirubin: 1.4 mg/dL — ABNORMAL HIGH (ref 0.3–1.2)
Total Protein: 6.5 g/dL (ref 6.5–8.1)

## 2021-07-28 LAB — IMMATURE PLATELET FRACTION: Immature Platelet Fraction: 2.4 % (ref 1.2–8.6)

## 2021-07-28 LAB — HEPATITIS PANEL, ACUTE
HCV Ab: NONREACTIVE
Hep A IgM: NONREACTIVE
Hep B C IgM: NONREACTIVE
Hepatitis B Surface Ag: NONREACTIVE

## 2021-07-28 LAB — TECHNOLOGIST SMEAR REVIEW: Plt Morphology: DECREASED

## 2021-07-28 LAB — FOLATE: Folate: 18.6 ng/mL (ref >5.9)

## 2021-07-28 LAB — VITAMIN B12: Vitamin B-12: 371 pg/mL (ref 180–914)

## 2021-07-28 NOTE — Progress Notes (Signed)
Hematology/Oncology Consult note [phone] [phone]         Patient Care Team: Sofie Hartigan, MD as PCP - General (Family Medicine)  REFERRING PROVIDER: Demetrius Charity, MD  CHIEF COMPLAINTS/REASON FOR VISIT:  Evaluation of pancytopenia  HISTORY OF PRESENTING ILLNESS:   Shaun Weaver is a  61 y.o.  male with PMH listed below was seen in consultation at the request of  Demetrius Charity, MD  for evaluation of pancytopenia Patient was admitted from 06/12/2021 - 06/28/2021 due to ambulatory dysfunction and right leg weakness, pain.  Neurosurgery was consulted.  Ambulatory dysfunction, leg weakness secondary to spinal stenosis with radiculopathy.  And epidural steroid injection administrated.  Patient was felt to be a poor surgical candidate with his present condition.  Neurosurgery may consider decompression in the future.  His hospitalization was complicated by cellulitis/thrombophlebitis of left antecubital fossa from peripheral IV resulting MSSA bacteremia.  Patient was treated with IV antibiotics and finished the course.  TTE showed no vegetations.  ID did not think TEE was necessary. 06/25/2021, CBC showed a hemoglobin of 12.7, white count 3.6, platelet count 41,000. Patient was referred to establish care with hematology for pancytopenia work-up Patient was accompanied by her sister today.  Patient has chronic history of alcoholic cirrhosis and follows up with [facility] gastroenterology.  Reviewed patient's previous lab records reviewed that he has been chronically thrombocytopenic.  His platelet count baseline was in the 90s.  Today patient denies any acute bleeding events.  Mobility is restricted due to back pain and lower extremity weakness secondary to spinal stenosis.  He has appointment for follow-up with neurosurgery for additional epidural injections.  Review of Systems  Constitutional:  Positive for fatigue. Negative for appetite change, chills, fever and  unexpected weight change.  HENT:   Negative for hearing loss and voice change.   Eyes:  Negative for eye problems and icterus.  Respiratory:  Negative for chest tightness, cough and shortness of breath.   Cardiovascular:  Negative for chest pain and leg swelling.  Gastrointestinal:  Negative for abdominal distention and abdominal pain.  Endocrine: Negative for hot flashes.  Genitourinary:  Negative for difficulty urinating, dysuria and frequency.   Musculoskeletal:  Positive for back pain. Negative for arthralgias.       Lower extremity weakness  Skin:  Negative for itching and rash.  Neurological:  Negative for light-headedness and numbness.  Hematological:  Negative for adenopathy. Does not bruise/bleed easily.  Psychiatric/Behavioral:  Negative for confusion.     MEDICAL HISTORY:  Past Medical History:  Diagnosis Date   Anxiety    Arthritis    Back pain    Cirrhosis, alcoholic ([place])    Coronary artery disease    Deep vein thrombosis of portal vein 2014   [place]   Environmental allergies    GERD (gastroesophageal reflux disease)    H/O alcohol abuse    Hypertension    Hypothyroidism    Thyroid disease     SURGICAL HISTORY: Past Surgical History:  Procedure Laterality Date   ANKLE FRACTURE SURGERY Right    EYE SURGERY Right    Cataract Extraction with lens implant   FRACTURE SURGERY Bilateral    pinning right ankle   IR INJECT/THERA/INC NEEDLE/CATH/PLC EPI/LUMB/SAC W/IMG  06/16/2021   KNEE ARTHROSCOPY Left 02/26/2015   Procedure: ARTHROSCOPY Left  KNEE partial medial menisectomy;  Surgeon: Leanor Kail, MD;  Location: [facility] ORS;  Service: Orthopedics;  Laterality: Left;   KNEE ARTHROSCOPY Right  03/31/2015   Procedure: ARTHROSCOPY KNEE;  Surgeon: Leanor Kail, MD;  Location: [facility] ORS;  Service: Orthopedics;  Laterality: Right;    SOCIAL HISTORY: Social History   Socioeconomic History   Marital status: Married    Spouse name: Not on file   Number of  children: Not on file   Years of education: Not on file   Highest education level: Not on file  Occupational History   Not on file  Tobacco Use   Smoking status: Never   Smokeless tobacco: Never  Vaping Use   Vaping Use: Never used  Substance and Sexual Activity   Alcohol use: No   Drug use: No   Sexual activity: Not on file  Other Topics Concern   Not on file  Social History Narrative   Not on file   Social Determinants of Health   Financial Resource Strain: Not on file  Food Insecurity: Not on file  Transportation Needs: Not on file  Physical Activity: Not on file  Stress: Not on file  Social Connections: Not on file  Intimate Partner Violence: Not on file    FAMILY HISTORY: History reviewed. No pertinent family history.  ALLERGIES:  has No Known Allergies.  MEDICATIONS:  Current Outpatient Medications  Medication Sig Dispense Refill   amitriptyline (ELAVIL) 25 MG tablet Take 25 mg by mouth at bedtime as needed for sleep.     carvedilol (COREG) 6.25 MG tablet Take 6.25 mg by mouth 2 (two) times daily with a meal.     ceFAZolin (ANCEF) 2-4 GM/100ML-% IVPB Inject 100 mLs (2 g total) into the vein every 8 (eight) hours. 1 each    ergocalciferol (VITAMIN D2) 50000 units capsule Take 50,000 Units by mouth once a week. Friday     fluticasone (FLONASE) 50 MCG/ACT nasal spray Place 2 sprays into both nostrils daily.     furosemide (LASIX) 20 MG tablet Take 20 mg by mouth daily.     gabapentin (NEURONTIN) 300 MG capsule Take 600 mg by mouth 3 (three) times daily.     HYDROcodone-acetaminophen (NORCO/VICODIN) 5-325 MG tablet Take 1 tablet by mouth every 4 (four) hours as needed for severe pain or moderate pain. 30 tablet 0   levothyroxine (SYNTHROID, LEVOTHROID) 100 MCG tablet Take 100 mcg by mouth daily before breakfast.     lidocaine (LIDODERM) 5 % Place 1 patch onto the skin every 12 (twelve) hours. Remove & Discard patch within 12 hours or as directed by MD 60 patch 11    magnesium 30 MG tablet Take 1 tablet (30 mg total) by mouth daily.     mupirocin ointment (BACTROBAN) 2 % Apply two times a day for 7 days. 22 g 0   omeprazole (PRILOSEC) 40 MG capsule Take 1 tablet by mouth daily.     rivaroxaban (XARELTO) 10 MG TABS tablet Take 10 mg by mouth daily.     traZODone (DESYREL) 50 MG tablet Take 50 mg by mouth as needed for sleep.     No current facility-administered medications for this visit.     PHYSICAL EXAMINATION: ECOG PERFORMANCE STATUS: 1 - Symptomatic but completely ambulatory Vitals:   07/28/21 1015  BP: (!) 143/86  Pulse: 97  Temp: (!) 96.9 F (36.1 C)   Filed Weights   07/28/21 1015  Weight: 256 lb (116.1 kg)    Physical Exam Constitutional:      General: He is not in acute distress.    Appearance: He is obese.  HENT:  Head: Normocephalic and atraumatic.  Eyes:     General: No scleral icterus. Cardiovascular:     Rate and Rhythm: Normal rate and regular rhythm.     Heart sounds: Normal heart sounds.  Pulmonary:     Effort: Pulmonary effort is normal. No respiratory distress.     Breath sounds: No wheezing.  Abdominal:     General: Bowel sounds are normal. There is no distension.     Palpations: Abdomen is soft.  Musculoskeletal:        General: No deformity. Normal range of motion.     Cervical back: Normal range of motion and neck supple.  Skin:    General: Skin is warm and dry.     Findings: No erythema or rash.  Neurological:     Mental Status: He is alert and oriented to person, place, and time. Mental status is at baseline.     Cranial Nerves: No cranial nerve deficit.  Psychiatric:        Mood and Affect: Mood normal.     LABORATORY DATA:  I have reviewed the data as listed Lab Results  Component Value Date   WBC 4.1 07/28/2021   HGB 13.9 07/28/2021   HCT 41.6 07/28/2021   MCV 95.9 07/28/2021   PLT 94 (L) 07/28/2021   Recent Labs    06/13/21 0424 06/13/21 1353 06/14/21 0558 06/15/21 0552  06/25/21 0533 06/26/21 0526 07/28/21 1059  NA 134*   < > 134*   < > 134* 137 135  K 5.6*   < > 5.1   < > 4.1 4.0 3.6  CL 106   < > 107   < > 105 108 102  CO2 23   < > 24   < > '23 25 27  '$ GLUCOSE 140*   < > 140*   < > 123* 119* 85  BUN 27*   < > 33*   < > '18 19 10  '$ CREATININE 1.33*   < > 1.46*   < > 1.10 0.97 1.02  CALCIUM 8.9   < > 9.0   < > 8.4* 8.5* 8.9  GFRNONAA >60   < > 54*   < > >60 >60 >60  PROT 6.5  --  5.6*  --   --   --  6.5  ALBUMIN 3.2*  --  2.9*  --   --   --  3.2*  AST 26  --  23  --   --   --  32  ALT 23  --  21  --   --   --  10  ALKPHOS 69  --  61  --   --   --  73  BILITOT 1.7*  --  0.8  --   --   --  1.4*   < > = values in this interval not displayed.   Iron/TIBC/Ferritin/ %Sat No results found for: "IRON", "TIBC", "FERRITIN", "IRONPCTSAT"    RADIOGRAPHIC STUDIES: I have personally reviewed the radiological images as listed and agreed with the findings in the report. No results found.    ASSESSMENT & PLAN:  1. Thrombocytopenia ([place])   2. Normocytic anemia    #Acute on chronic thrombocytopenia, The acute component is likely secondary to infection/antibiotic use.  The chronic component is likely secondary to the splenomegaly due to cirrhosis.  I recommend to repeat CBC, CMP, immature platelet fraction, peripheral blood flow flow cytometry, B12, folate, hepatitis panel, protein electrophoresis.  #Normocytic anemia, likely reactive  due to infection/antibiotic use.  Pending above work-up.  -Available labs are reviewed at the time of dictation.  CBC showed improvement of hemoglobin and platelet count. Patient has a vitamin B12 level of 371, folate 18.6.  Additional labs are pending.  Given that he is count has improved close to his baseline now I will hold off additional testing.  Patient will follow-up in 6 months.   Orders Placed This Encounter  Procedures   Technologist smear review    Standing Status:   Future    Number of Occurrences:   1    Standing  Expiration Date:   07/29/2022   CBC with Differential/Platelet    Standing Status:   Future    Number of Occurrences:   1    Standing Expiration Date:   07/29/2022   Comprehensive metabolic panel    Standing Status:   Future    Number of Occurrences:   1    Standing Expiration Date:   07/29/2022   Immature Platelet Fraction    Standing Status:   Future    Number of Occurrences:   1    Standing Expiration Date:   07/29/2022   Flow cytometry panel-leukemia/lymphoma work-up    Standing Status:   Future    Number of Occurrences:   1    Standing Expiration Date:   07/29/2022   Vitamin B12    Standing Status:   Future    Number of Occurrences:   1    Standing Expiration Date:   07/29/2022   Folate    Standing Status:   Future    Number of Occurrences:   1    Standing Expiration Date:   07/29/2022   Hepatitis panel, acute    Standing Status:   Future    Number of Occurrences:   1    Standing Expiration Date:   07/29/2022   Protein electrophoresis, serum    Standing Status:   Future    Number of Occurrences:   1    Standing Expiration Date:   07/29/2022    All questions were answered. The patient knows to call the clinic with any problems questions or concerns.  cc Demetrius Charity, MD    Return of visit: I recommend patient to follow-up in 6 months. Thank you for this kind referral and the opportunity to participate in the care of this patient. A copy of today's note is routed to referring provider   Earlie Server, MD, PhD [facility] Hematology Oncology 07/28/2021

## 2021-07-30 LAB — PROTEIN ELECTROPHORESIS, SERUM
A/G Ratio: 1.1 (ref 0.7–1.7)
Albumin ELP: 3.2 g/dL (ref 2.9–4.4)
Alpha-1-Globulin: 0.2 g/dL (ref 0.0–0.4)
Alpha-2-Globulin: 0.5 g/dL (ref 0.4–1.0)
Beta Globulin: 0.9 g/dL (ref 0.7–1.3)
Gamma Globulin: 1.2 g/dL (ref 0.4–1.8)
Globulin, Total: 2.9 g/dL (ref 2.2–3.9)
Total Protein ELP: 6.1 g/dL (ref 6.0–8.5)

## 2021-07-31 ENCOUNTER — Ambulatory Visit
Admission: RE | Admit: 2021-07-31 | Discharge: 2021-07-31 | Disposition: A | Discharge status: Home / Self Care | Payer: BC Managed Care – PPO | Source: Ambulatory Visit | Attending: Physical Medicine & Rehabilitation | Admitting: Physical Medicine & Rehabilitation

## 2021-07-31 ENCOUNTER — Other Ambulatory Visit: Payer: Self-pay | Admitting: Physical Medicine & Rehabilitation

## 2021-07-31 DIAGNOSIS — M7989 Other specified soft tissue disorders: Secondary | ICD-10-CM | POA: Insufficient documentation

## 2021-07-31 LAB — COMP PANEL: LEUKEMIA/LYMPHOMA: CLINICAL INFO: 27

## 2021-12-31 ENCOUNTER — Other Ambulatory Visit: Payer: Self-pay | Admitting: Adult Health Nurse Practitioner

## 2021-12-31 DIAGNOSIS — I8510 Secondary esophageal varices without bleeding: Secondary | ICD-10-CM

## 2021-12-31 DIAGNOSIS — K7031 Alcoholic cirrhosis of liver with ascites: Secondary | ICD-10-CM

## 2022-01-07 ENCOUNTER — Ambulatory Visit
Admission: RE | Admit: 2022-01-07 | Discharge: 2022-01-07 | Disposition: A | Discharge status: Home / Self Care | Payer: BC Managed Care – PPO | Source: Ambulatory Visit | Attending: Adult Health Nurse Practitioner | Admitting: Adult Health Nurse Practitioner

## 2022-01-07 DIAGNOSIS — R161 Splenomegaly, not elsewhere classified: Secondary | ICD-10-CM | POA: Diagnosis not present

## 2022-01-07 DIAGNOSIS — K760 Fatty (change of) liver, not elsewhere classified: Secondary | ICD-10-CM | POA: Insufficient documentation

## 2022-01-07 DIAGNOSIS — R932 Abnormal findings on diagnostic imaging of liver and biliary tract: Secondary | ICD-10-CM | POA: Insufficient documentation

## 2022-01-07 DIAGNOSIS — I8510 Secondary esophageal varices without bleeding: Secondary | ICD-10-CM | POA: Diagnosis not present

## 2022-01-07 DIAGNOSIS — K7031 Alcoholic cirrhosis of liver with ascites: Secondary | ICD-10-CM | POA: Insufficient documentation

## 2022-01-07 DIAGNOSIS — N281 Cyst of kidney, acquired: Secondary | ICD-10-CM | POA: Insufficient documentation

## 2022-01-07 DIAGNOSIS — D1809 Hemangioma of other sites: Secondary | ICD-10-CM | POA: Insufficient documentation

## 2022-01-07 DIAGNOSIS — K7689 Other specified diseases of liver: Secondary | ICD-10-CM | POA: Diagnosis not present

## 2022-01-07 MED ORDER — GADOBUTROL 1 MMOL/ML IV SOLN
10.0000 mL | Freq: Once | INTRAVENOUS | Status: AC | PRN
  Administered 2022-01-07: 10 mL via INTRAVENOUS

## 2022-01-15 DIAGNOSIS — H40023 Open angle with borderline findings, high risk, bilateral: Secondary | ICD-10-CM | POA: Diagnosis not present

## 2022-01-15 DIAGNOSIS — H21233 Degeneration of iris (pigmentary), bilateral: Secondary | ICD-10-CM | POA: Diagnosis not present

## 2022-01-18 DIAGNOSIS — M5441 Lumbago with sciatica, right side: Secondary | ICD-10-CM | POA: Diagnosis not present

## 2022-01-18 DIAGNOSIS — G8929 Other chronic pain: Secondary | ICD-10-CM | POA: Diagnosis not present

## 2022-01-18 DIAGNOSIS — M533 Sacrococcygeal disorders, not elsewhere classified: Secondary | ICD-10-CM | POA: Diagnosis not present

## 2022-01-18 DIAGNOSIS — M48062 Spinal stenosis, lumbar region with neurogenic claudication: Secondary | ICD-10-CM | POA: Diagnosis not present

## 2022-01-26 ENCOUNTER — Other Ambulatory Visit: Payer: Self-pay | Admitting: *Deleted

## 2022-01-26 DIAGNOSIS — D696 Thrombocytopenia, unspecified: Secondary | ICD-10-CM

## 2022-01-26 DIAGNOSIS — D649 Anemia, unspecified: Secondary | ICD-10-CM

## 2022-01-27 ENCOUNTER — Telehealth: Payer: Self-pay | Admitting: Oncology

## 2022-01-27 ENCOUNTER — Inpatient Hospital Stay: Payer: BC Managed Care – PPO | Attending: Oncology

## 2022-01-27 ENCOUNTER — Inpatient Hospital Stay: Payer: BC Managed Care – PPO | Admitting: Oncology

## 2022-01-27 NOTE — Telephone Encounter (Signed)
pt missed appt for today, reached out to have him r/s. He would like to know why he needs appt before r/s

## 2022-01-27 NOTE — Telephone Encounter (Signed)
Called and spoke to pt and informed him that appt is to follow up on anemia and thromobocytpenia. He states he does not want to r/s and states that he has "another Doctor following up on it at [facility]."

## 2022-01-27 NOTE — Assessment & Plan Note (Deleted)
secondary to the splenomegaly due to cirrhosis.

## 2022-02-22 DIAGNOSIS — K766 Portal hypertension: Secondary | ICD-10-CM | POA: Diagnosis not present

## 2022-02-22 DIAGNOSIS — K7460 Unspecified cirrhosis of liver: Secondary | ICD-10-CM | POA: Diagnosis not present

## 2022-02-22 DIAGNOSIS — I81 Portal vein thrombosis: Secondary | ICD-10-CM | POA: Diagnosis not present

## 2022-02-25 DIAGNOSIS — G8929 Other chronic pain: Secondary | ICD-10-CM | POA: Diagnosis not present

## 2022-02-25 DIAGNOSIS — M533 Sacrococcygeal disorders, not elsewhere classified: Secondary | ICD-10-CM | POA: Diagnosis not present

## 2022-03-11 DIAGNOSIS — M533 Sacrococcygeal disorders, not elsewhere classified: Secondary | ICD-10-CM | POA: Diagnosis not present

## 2022-03-11 DIAGNOSIS — G8929 Other chronic pain: Secondary | ICD-10-CM | POA: Diagnosis not present

## 2022-03-11 DIAGNOSIS — M48062 Spinal stenosis, lumbar region with neurogenic claudication: Secondary | ICD-10-CM | POA: Diagnosis not present

## 2022-03-11 DIAGNOSIS — M5441 Lumbago with sciatica, right side: Secondary | ICD-10-CM | POA: Diagnosis not present

## 2022-05-11 DIAGNOSIS — E782 Mixed hyperlipidemia: Secondary | ICD-10-CM | POA: Diagnosis not present

## 2022-05-11 DIAGNOSIS — R7302 Impaired glucose tolerance (oral): Secondary | ICD-10-CM | POA: Diagnosis not present

## 2022-05-12 DIAGNOSIS — G629 Polyneuropathy, unspecified: Secondary | ICD-10-CM | POA: Diagnosis not present

## 2022-05-18 DIAGNOSIS — R7302 Impaired glucose tolerance (oral): Secondary | ICD-10-CM | POA: Diagnosis not present

## 2022-05-18 DIAGNOSIS — F419 Anxiety disorder, unspecified: Secondary | ICD-10-CM | POA: Diagnosis not present

## 2022-05-18 DIAGNOSIS — E440 Moderate protein-calorie malnutrition: Secondary | ICD-10-CM | POA: Diagnosis not present

## 2022-05-18 DIAGNOSIS — K219 Gastro-esophageal reflux disease without esophagitis: Secondary | ICD-10-CM | POA: Diagnosis not present

## 2022-05-18 DIAGNOSIS — E039 Hypothyroidism, unspecified: Secondary | ICD-10-CM | POA: Diagnosis not present

## 2022-05-18 DIAGNOSIS — E782 Mixed hyperlipidemia: Secondary | ICD-10-CM | POA: Diagnosis not present

## 2022-05-18 DIAGNOSIS — M129 Arthropathy, unspecified: Secondary | ICD-10-CM | POA: Diagnosis not present

## 2022-05-18 DIAGNOSIS — D696 Thrombocytopenia, unspecified: Secondary | ICD-10-CM | POA: Diagnosis not present

## 2022-05-18 DIAGNOSIS — I10 Essential (primary) hypertension: Secondary | ICD-10-CM | POA: Diagnosis not present

## 2022-06-23 ENCOUNTER — Inpatient Hospital Stay: Payer: Medicaid Other

## 2022-06-23 ENCOUNTER — Inpatient Hospital Stay: Payer: Medicaid Other | Admitting: Oncology

## 2022-08-04 DIAGNOSIS — E669 Obesity, unspecified: Secondary | ICD-10-CM | POA: Diagnosis not present

## 2022-08-04 DIAGNOSIS — M1712 Unilateral primary osteoarthritis, left knee: Secondary | ICD-10-CM | POA: Diagnosis not present

## 2022-09-27 DIAGNOSIS — D696 Thrombocytopenia, unspecified: Secondary | ICD-10-CM | POA: Diagnosis not present

## 2022-09-27 DIAGNOSIS — E871 Hypo-osmolality and hyponatremia: Secondary | ICD-10-CM | POA: Diagnosis not present

## 2022-09-27 DIAGNOSIS — D61818 Other pancytopenia: Secondary | ICD-10-CM | POA: Diagnosis not present

## 2022-09-27 DIAGNOSIS — R0602 Shortness of breath: Secondary | ICD-10-CM | POA: Diagnosis not present

## 2022-09-27 DIAGNOSIS — J918 Pleural effusion in other conditions classified elsewhere: Secondary | ICD-10-CM | POA: Diagnosis not present

## 2022-09-27 DIAGNOSIS — Z86718 Personal history of other venous thrombosis and embolism: Secondary | ICD-10-CM | POA: Diagnosis not present

## 2022-09-27 DIAGNOSIS — G8929 Other chronic pain: Secondary | ICD-10-CM | POA: Diagnosis not present

## 2022-09-27 DIAGNOSIS — J90 Pleural effusion, not elsewhere classified: Secondary | ICD-10-CM | POA: Diagnosis not present

## 2022-09-27 DIAGNOSIS — Z79890 Hormone replacement therapy (postmenopausal): Secondary | ICD-10-CM | POA: Diagnosis not present

## 2022-09-27 DIAGNOSIS — I10 Essential (primary) hypertension: Secondary | ICD-10-CM | POA: Diagnosis not present

## 2022-09-27 DIAGNOSIS — J9811 Atelectasis: Secondary | ICD-10-CM | POA: Diagnosis not present

## 2022-09-27 DIAGNOSIS — I491 Atrial premature depolarization: Secondary | ICD-10-CM | POA: Diagnosis not present

## 2022-09-27 DIAGNOSIS — I4581 Long QT syndrome: Secondary | ICD-10-CM | POA: Diagnosis not present

## 2022-09-27 DIAGNOSIS — E785 Hyperlipidemia, unspecified: Secondary | ICD-10-CM | POA: Diagnosis not present

## 2022-09-27 DIAGNOSIS — K769 Liver disease, unspecified: Secondary | ICD-10-CM | POA: Diagnosis not present

## 2022-09-27 DIAGNOSIS — K219 Gastro-esophageal reflux disease without esophagitis: Secondary | ICD-10-CM | POA: Diagnosis not present

## 2022-09-27 DIAGNOSIS — I4891 Unspecified atrial fibrillation: Secondary | ICD-10-CM | POA: Diagnosis not present

## 2022-09-27 DIAGNOSIS — R079 Chest pain, unspecified: Secondary | ICD-10-CM | POA: Diagnosis not present

## 2022-09-27 DIAGNOSIS — R0902 Hypoxemia: Secondary | ICD-10-CM | POA: Diagnosis not present

## 2022-09-27 DIAGNOSIS — K760 Fatty (change of) liver, not elsewhere classified: Secondary | ICD-10-CM | POA: Diagnosis not present

## 2022-09-27 DIAGNOSIS — F419 Anxiety disorder, unspecified: Secondary | ICD-10-CM | POA: Diagnosis not present

## 2022-09-27 DIAGNOSIS — E039 Hypothyroidism, unspecified: Secondary | ICD-10-CM | POA: Diagnosis not present

## 2022-09-27 DIAGNOSIS — I4729 Other ventricular tachycardia: Secondary | ICD-10-CM | POA: Diagnosis not present

## 2022-09-27 DIAGNOSIS — N179 Acute kidney failure, unspecified: Secondary | ICD-10-CM | POA: Diagnosis not present

## 2022-09-27 DIAGNOSIS — K7682 Hepatic encephalopathy: Secondary | ICD-10-CM | POA: Diagnosis not present

## 2022-09-28 DIAGNOSIS — J90 Pleural effusion, not elsewhere classified: Secondary | ICD-10-CM | POA: Diagnosis not present

## 2022-09-28 DIAGNOSIS — R0902 Hypoxemia: Secondary | ICD-10-CM | POA: Diagnosis not present

## 2022-09-28 DIAGNOSIS — I10 Essential (primary) hypertension: Secondary | ICD-10-CM | POA: Diagnosis not present

## 2022-09-28 DIAGNOSIS — N179 Acute kidney failure, unspecified: Secondary | ICD-10-CM | POA: Diagnosis not present

## 2022-09-28 DIAGNOSIS — G8929 Other chronic pain: Secondary | ICD-10-CM | POA: Diagnosis not present

## 2022-09-28 DIAGNOSIS — R0600 Dyspnea, unspecified: Secondary | ICD-10-CM | POA: Diagnosis not present

## 2022-09-28 DIAGNOSIS — J939 Pneumothorax, unspecified: Secondary | ICD-10-CM | POA: Diagnosis not present

## 2022-09-28 DIAGNOSIS — E039 Hypothyroidism, unspecified: Secondary | ICD-10-CM | POA: Diagnosis not present

## 2022-09-28 DIAGNOSIS — K219 Gastro-esophageal reflux disease without esophagitis: Secondary | ICD-10-CM | POA: Diagnosis not present

## 2022-09-28 DIAGNOSIS — Z48813 Encounter for surgical aftercare following surgery on the respiratory system: Secondary | ICD-10-CM | POA: Diagnosis not present

## 2022-09-28 DIAGNOSIS — I81 Portal vein thrombosis: Secondary | ICD-10-CM | POA: Diagnosis not present

## 2022-09-28 DIAGNOSIS — K760 Fatty (change of) liver, not elsewhere classified: Secondary | ICD-10-CM | POA: Diagnosis not present

## 2022-09-28 DIAGNOSIS — E871 Hypo-osmolality and hyponatremia: Secondary | ICD-10-CM | POA: Diagnosis not present

## 2022-09-28 DIAGNOSIS — I491 Atrial premature depolarization: Secondary | ICD-10-CM | POA: Diagnosis not present

## 2022-09-28 DIAGNOSIS — J9811 Atelectasis: Secondary | ICD-10-CM | POA: Diagnosis not present

## 2022-09-29 DIAGNOSIS — K760 Fatty (change of) liver, not elsewhere classified: Secondary | ICD-10-CM | POA: Diagnosis not present

## 2022-09-29 DIAGNOSIS — K219 Gastro-esophageal reflux disease without esophagitis: Secondary | ICD-10-CM | POA: Diagnosis not present

## 2022-09-29 DIAGNOSIS — K7460 Unspecified cirrhosis of liver: Secondary | ICD-10-CM | POA: Diagnosis not present

## 2022-09-29 DIAGNOSIS — E039 Hypothyroidism, unspecified: Secondary | ICD-10-CM | POA: Diagnosis not present

## 2022-09-29 DIAGNOSIS — I81 Portal vein thrombosis: Secondary | ICD-10-CM | POA: Diagnosis not present

## 2022-09-29 DIAGNOSIS — I10 Essential (primary) hypertension: Secondary | ICD-10-CM | POA: Diagnosis not present

## 2022-09-29 DIAGNOSIS — R0600 Dyspnea, unspecified: Secondary | ICD-10-CM | POA: Diagnosis not present

## 2022-09-29 DIAGNOSIS — E871 Hypo-osmolality and hyponatremia: Secondary | ICD-10-CM | POA: Diagnosis not present

## 2022-09-29 DIAGNOSIS — R0902 Hypoxemia: Secondary | ICD-10-CM | POA: Diagnosis not present

## 2022-09-29 DIAGNOSIS — N179 Acute kidney failure, unspecified: Secondary | ICD-10-CM | POA: Diagnosis not present

## 2022-09-29 DIAGNOSIS — J90 Pleural effusion, not elsewhere classified: Secondary | ICD-10-CM | POA: Diagnosis not present

## 2022-09-30 DIAGNOSIS — I81 Portal vein thrombosis: Secondary | ICD-10-CM | POA: Diagnosis not present

## 2022-09-30 DIAGNOSIS — E039 Hypothyroidism, unspecified: Secondary | ICD-10-CM | POA: Diagnosis not present

## 2022-09-30 DIAGNOSIS — K7460 Unspecified cirrhosis of liver: Secondary | ICD-10-CM | POA: Diagnosis not present

## 2022-09-30 DIAGNOSIS — E871 Hypo-osmolality and hyponatremia: Secondary | ICD-10-CM | POA: Diagnosis not present

## 2022-09-30 DIAGNOSIS — J90 Pleural effusion, not elsewhere classified: Secondary | ICD-10-CM | POA: Diagnosis not present

## 2022-09-30 DIAGNOSIS — K760 Fatty (change of) liver, not elsewhere classified: Secondary | ICD-10-CM | POA: Diagnosis not present

## 2022-09-30 DIAGNOSIS — D61818 Other pancytopenia: Secondary | ICD-10-CM | POA: Diagnosis not present

## 2022-09-30 DIAGNOSIS — I10 Essential (primary) hypertension: Secondary | ICD-10-CM | POA: Diagnosis not present

## 2022-09-30 DIAGNOSIS — R0602 Shortness of breath: Secondary | ICD-10-CM | POA: Diagnosis not present

## 2022-09-30 DIAGNOSIS — K219 Gastro-esophageal reflux disease without esophagitis: Secondary | ICD-10-CM | POA: Diagnosis not present

## 2022-09-30 DIAGNOSIS — N179 Acute kidney failure, unspecified: Secondary | ICD-10-CM | POA: Diagnosis not present

## 2022-09-30 DIAGNOSIS — R0902 Hypoxemia: Secondary | ICD-10-CM | POA: Diagnosis not present

## 2022-10-01 DIAGNOSIS — N179 Acute kidney failure, unspecified: Secondary | ICD-10-CM | POA: Diagnosis not present

## 2022-10-01 DIAGNOSIS — I81 Portal vein thrombosis: Secondary | ICD-10-CM | POA: Diagnosis not present

## 2022-10-01 DIAGNOSIS — R0902 Hypoxemia: Secondary | ICD-10-CM | POA: Diagnosis not present

## 2022-10-01 DIAGNOSIS — R000 Tachycardia, unspecified: Secondary | ICD-10-CM | POA: Diagnosis not present

## 2022-10-01 DIAGNOSIS — K760 Fatty (change of) liver, not elsewhere classified: Secondary | ICD-10-CM | POA: Diagnosis not present

## 2022-10-01 DIAGNOSIS — I071 Rheumatic tricuspid insufficiency: Secondary | ICD-10-CM | POA: Diagnosis not present

## 2022-10-01 DIAGNOSIS — G8929 Other chronic pain: Secondary | ICD-10-CM | POA: Diagnosis not present

## 2022-10-01 DIAGNOSIS — Z9889 Other specified postprocedural states: Secondary | ICD-10-CM | POA: Diagnosis not present

## 2022-10-01 DIAGNOSIS — Z79899 Other long term (current) drug therapy: Secondary | ICD-10-CM | POA: Diagnosis not present

## 2022-10-01 DIAGNOSIS — K219 Gastro-esophageal reflux disease without esophagitis: Secondary | ICD-10-CM | POA: Diagnosis not present

## 2022-10-01 DIAGNOSIS — R0600 Dyspnea, unspecified: Secondary | ICD-10-CM | POA: Diagnosis not present

## 2022-10-01 DIAGNOSIS — I10 Essential (primary) hypertension: Secondary | ICD-10-CM | POA: Diagnosis not present

## 2022-10-01 DIAGNOSIS — J9811 Atelectasis: Secondary | ICD-10-CM | POA: Diagnosis not present

## 2022-10-01 DIAGNOSIS — E039 Hypothyroidism, unspecified: Secondary | ICD-10-CM | POA: Diagnosis not present

## 2022-10-01 DIAGNOSIS — J90 Pleural effusion, not elsewhere classified: Secondary | ICD-10-CM | POA: Diagnosis not present

## 2022-10-02 DIAGNOSIS — R0600 Dyspnea, unspecified: Secondary | ICD-10-CM | POA: Diagnosis not present

## 2022-10-02 DIAGNOSIS — K219 Gastro-esophageal reflux disease without esophagitis: Secondary | ICD-10-CM | POA: Diagnosis not present

## 2022-10-02 DIAGNOSIS — D61818 Other pancytopenia: Secondary | ICD-10-CM | POA: Diagnosis not present

## 2022-10-02 DIAGNOSIS — K760 Fatty (change of) liver, not elsewhere classified: Secondary | ICD-10-CM | POA: Diagnosis not present

## 2022-10-02 DIAGNOSIS — I81 Portal vein thrombosis: Secondary | ICD-10-CM | POA: Diagnosis not present

## 2022-10-02 DIAGNOSIS — E039 Hypothyroidism, unspecified: Secondary | ICD-10-CM | POA: Diagnosis not present

## 2022-10-02 DIAGNOSIS — K7290 Hepatic failure, unspecified without coma: Secondary | ICD-10-CM | POA: Diagnosis not present

## 2022-10-02 DIAGNOSIS — N179 Acute kidney failure, unspecified: Secondary | ICD-10-CM | POA: Diagnosis not present

## 2022-10-02 DIAGNOSIS — J90 Pleural effusion, not elsewhere classified: Secondary | ICD-10-CM | POA: Diagnosis not present

## 2022-10-02 DIAGNOSIS — F419 Anxiety disorder, unspecified: Secondary | ICD-10-CM | POA: Diagnosis not present

## 2022-10-02 DIAGNOSIS — I8500 Esophageal varices without bleeding: Secondary | ICD-10-CM | POA: Diagnosis not present

## 2022-10-02 DIAGNOSIS — R161 Splenomegaly, not elsewhere classified: Secondary | ICD-10-CM | POA: Diagnosis not present

## 2022-10-02 DIAGNOSIS — R0902 Hypoxemia: Secondary | ICD-10-CM | POA: Diagnosis not present

## 2022-10-02 DIAGNOSIS — I10 Essential (primary) hypertension: Secondary | ICD-10-CM | POA: Diagnosis not present

## 2022-10-03 DIAGNOSIS — D61818 Other pancytopenia: Secondary | ICD-10-CM | POA: Diagnosis not present

## 2022-10-03 DIAGNOSIS — G8929 Other chronic pain: Secondary | ICD-10-CM | POA: Diagnosis not present

## 2022-10-03 DIAGNOSIS — K219 Gastro-esophageal reflux disease without esophagitis: Secondary | ICD-10-CM | POA: Diagnosis not present

## 2022-10-03 DIAGNOSIS — N179 Acute kidney failure, unspecified: Secondary | ICD-10-CM | POA: Diagnosis not present

## 2022-10-03 DIAGNOSIS — Z79890 Hormone replacement therapy (postmenopausal): Secondary | ICD-10-CM | POA: Diagnosis not present

## 2022-10-03 DIAGNOSIS — J90 Pleural effusion, not elsewhere classified: Secondary | ICD-10-CM | POA: Diagnosis not present

## 2022-10-03 DIAGNOSIS — I10 Essential (primary) hypertension: Secondary | ICD-10-CM | POA: Diagnosis not present

## 2022-10-03 DIAGNOSIS — F419 Anxiety disorder, unspecified: Secondary | ICD-10-CM | POA: Diagnosis not present

## 2022-10-03 DIAGNOSIS — Z79899 Other long term (current) drug therapy: Secondary | ICD-10-CM | POA: Diagnosis not present

## 2022-10-03 DIAGNOSIS — E039 Hypothyroidism, unspecified: Secondary | ICD-10-CM | POA: Diagnosis not present

## 2022-10-03 DIAGNOSIS — I81 Portal vein thrombosis: Secondary | ICD-10-CM | POA: Diagnosis not present

## 2022-10-04 DIAGNOSIS — E039 Hypothyroidism, unspecified: Secondary | ICD-10-CM | POA: Diagnosis not present

## 2022-10-04 DIAGNOSIS — G8929 Other chronic pain: Secondary | ICD-10-CM | POA: Diagnosis not present

## 2022-10-04 DIAGNOSIS — K7469 Other cirrhosis of liver: Secondary | ICD-10-CM | POA: Diagnosis not present

## 2022-10-04 DIAGNOSIS — F419 Anxiety disorder, unspecified: Secondary | ICD-10-CM | POA: Diagnosis not present

## 2022-10-04 DIAGNOSIS — I129 Hypertensive chronic kidney disease with stage 1 through stage 4 chronic kidney disease, or unspecified chronic kidney disease: Secondary | ICD-10-CM | POA: Diagnosis not present

## 2022-10-04 DIAGNOSIS — I81 Portal vein thrombosis: Secondary | ICD-10-CM | POA: Diagnosis not present

## 2022-10-04 DIAGNOSIS — Z79899 Other long term (current) drug therapy: Secondary | ICD-10-CM | POA: Diagnosis not present

## 2022-10-04 DIAGNOSIS — K219 Gastro-esophageal reflux disease without esophagitis: Secondary | ICD-10-CM | POA: Diagnosis not present

## 2022-10-04 DIAGNOSIS — N189 Chronic kidney disease, unspecified: Secondary | ICD-10-CM | POA: Diagnosis not present

## 2022-10-04 DIAGNOSIS — N179 Acute kidney failure, unspecified: Secondary | ICD-10-CM | POA: Diagnosis not present

## 2022-10-04 DIAGNOSIS — J90 Pleural effusion, not elsewhere classified: Secondary | ICD-10-CM | POA: Diagnosis not present

## 2022-10-05 DIAGNOSIS — R0902 Hypoxemia: Secondary | ICD-10-CM | POA: Diagnosis not present

## 2022-10-05 DIAGNOSIS — J9811 Atelectasis: Secondary | ICD-10-CM | POA: Diagnosis not present

## 2022-10-05 DIAGNOSIS — K7460 Unspecified cirrhosis of liver: Secondary | ICD-10-CM | POA: Diagnosis not present

## 2022-10-05 DIAGNOSIS — J90 Pleural effusion, not elsewhere classified: Secondary | ICD-10-CM | POA: Diagnosis not present

## 2022-10-06 DIAGNOSIS — J90 Pleural effusion, not elsewhere classified: Secondary | ICD-10-CM | POA: Diagnosis not present

## 2022-10-07 DIAGNOSIS — I8510 Secondary esophageal varices without bleeding: Secondary | ICD-10-CM | POA: Diagnosis not present

## 2022-10-07 DIAGNOSIS — N179 Acute kidney failure, unspecified: Secondary | ICD-10-CM | POA: Diagnosis not present

## 2022-10-07 DIAGNOSIS — J948 Other specified pleural conditions: Secondary | ICD-10-CM | POA: Diagnosis not present

## 2022-10-07 DIAGNOSIS — J90 Pleural effusion, not elsewhere classified: Secondary | ICD-10-CM | POA: Diagnosis not present

## 2022-10-07 DIAGNOSIS — K7682 Hepatic encephalopathy: Secondary | ICD-10-CM | POA: Diagnosis not present

## 2022-10-07 DIAGNOSIS — J9811 Atelectasis: Secondary | ICD-10-CM | POA: Diagnosis not present

## 2022-10-07 DIAGNOSIS — I81 Portal vein thrombosis: Secondary | ICD-10-CM | POA: Diagnosis not present

## 2022-10-08 DIAGNOSIS — K7290 Hepatic failure, unspecified without coma: Secondary | ICD-10-CM | POA: Diagnosis not present

## 2022-10-08 DIAGNOSIS — K7682 Hepatic encephalopathy: Secondary | ICD-10-CM | POA: Diagnosis not present

## 2022-10-08 DIAGNOSIS — J90 Pleural effusion, not elsewhere classified: Secondary | ICD-10-CM | POA: Diagnosis not present

## 2022-10-08 DIAGNOSIS — R161 Splenomegaly, not elsewhere classified: Secondary | ICD-10-CM | POA: Diagnosis not present

## 2022-10-08 DIAGNOSIS — J948 Other specified pleural conditions: Secondary | ICD-10-CM | POA: Diagnosis not present

## 2022-10-08 DIAGNOSIS — J9811 Atelectasis: Secondary | ICD-10-CM | POA: Diagnosis not present

## 2022-10-09 DIAGNOSIS — I81 Portal vein thrombosis: Secondary | ICD-10-CM | POA: Diagnosis not present

## 2022-10-09 DIAGNOSIS — N179 Acute kidney failure, unspecified: Secondary | ICD-10-CM | POA: Diagnosis not present

## 2022-10-09 DIAGNOSIS — K7682 Hepatic encephalopathy: Secondary | ICD-10-CM | POA: Diagnosis not present

## 2022-10-09 DIAGNOSIS — K219 Gastro-esophageal reflux disease without esophagitis: Secondary | ICD-10-CM | POA: Diagnosis not present

## 2022-10-09 DIAGNOSIS — I129 Hypertensive chronic kidney disease with stage 1 through stage 4 chronic kidney disease, or unspecified chronic kidney disease: Secondary | ICD-10-CM | POA: Diagnosis not present

## 2022-10-09 DIAGNOSIS — I8500 Esophageal varices without bleeding: Secondary | ICD-10-CM | POA: Diagnosis not present

## 2022-10-09 DIAGNOSIS — J90 Pleural effusion, not elsewhere classified: Secondary | ICD-10-CM | POA: Diagnosis not present

## 2022-10-09 DIAGNOSIS — N189 Chronic kidney disease, unspecified: Secondary | ICD-10-CM | POA: Diagnosis not present

## 2022-10-09 DIAGNOSIS — K7469 Other cirrhosis of liver: Secondary | ICD-10-CM | POA: Diagnosis not present

## 2022-10-09 DIAGNOSIS — R0902 Hypoxemia: Secondary | ICD-10-CM | POA: Diagnosis not present

## 2022-10-09 DIAGNOSIS — J948 Other specified pleural conditions: Secondary | ICD-10-CM | POA: Diagnosis not present

## 2022-10-10 DIAGNOSIS — I8500 Esophageal varices without bleeding: Secondary | ICD-10-CM | POA: Diagnosis not present

## 2022-10-10 DIAGNOSIS — I129 Hypertensive chronic kidney disease with stage 1 through stage 4 chronic kidney disease, or unspecified chronic kidney disease: Secondary | ICD-10-CM | POA: Diagnosis not present

## 2022-10-10 DIAGNOSIS — I81 Portal vein thrombosis: Secondary | ICD-10-CM | POA: Diagnosis not present

## 2022-10-10 DIAGNOSIS — J9811 Atelectasis: Secondary | ICD-10-CM | POA: Diagnosis not present

## 2022-10-10 DIAGNOSIS — N179 Acute kidney failure, unspecified: Secondary | ICD-10-CM | POA: Diagnosis not present

## 2022-10-10 DIAGNOSIS — N189 Chronic kidney disease, unspecified: Secondary | ICD-10-CM | POA: Diagnosis not present

## 2022-10-10 DIAGNOSIS — J90 Pleural effusion, not elsewhere classified: Secondary | ICD-10-CM | POA: Diagnosis not present

## 2022-10-10 DIAGNOSIS — K7682 Hepatic encephalopathy: Secondary | ICD-10-CM | POA: Diagnosis not present

## 2022-10-10 DIAGNOSIS — K219 Gastro-esophageal reflux disease without esophagitis: Secondary | ICD-10-CM | POA: Diagnosis not present

## 2022-10-10 DIAGNOSIS — R0902 Hypoxemia: Secondary | ICD-10-CM | POA: Diagnosis not present

## 2022-10-10 DIAGNOSIS — K7469 Other cirrhosis of liver: Secondary | ICD-10-CM | POA: Diagnosis not present

## 2022-10-10 DIAGNOSIS — J948 Other specified pleural conditions: Secondary | ICD-10-CM | POA: Diagnosis not present

## 2022-10-11 DIAGNOSIS — J948 Other specified pleural conditions: Secondary | ICD-10-CM | POA: Diagnosis not present

## 2022-10-11 DIAGNOSIS — K7469 Other cirrhosis of liver: Secondary | ICD-10-CM | POA: Diagnosis not present

## 2022-10-11 DIAGNOSIS — K219 Gastro-esophageal reflux disease without esophagitis: Secondary | ICD-10-CM | POA: Diagnosis not present

## 2022-10-11 DIAGNOSIS — N179 Acute kidney failure, unspecified: Secondary | ICD-10-CM | POA: Diagnosis not present

## 2022-10-11 DIAGNOSIS — R0902 Hypoxemia: Secondary | ICD-10-CM | POA: Diagnosis not present

## 2022-10-11 DIAGNOSIS — I8500 Esophageal varices without bleeding: Secondary | ICD-10-CM | POA: Diagnosis not present

## 2022-10-11 DIAGNOSIS — I81 Portal vein thrombosis: Secondary | ICD-10-CM | POA: Diagnosis not present

## 2022-10-11 DIAGNOSIS — J90 Pleural effusion, not elsewhere classified: Secondary | ICD-10-CM | POA: Diagnosis not present

## 2022-10-11 DIAGNOSIS — I129 Hypertensive chronic kidney disease with stage 1 through stage 4 chronic kidney disease, or unspecified chronic kidney disease: Secondary | ICD-10-CM | POA: Diagnosis not present

## 2022-10-11 DIAGNOSIS — N189 Chronic kidney disease, unspecified: Secondary | ICD-10-CM | POA: Diagnosis not present

## 2022-10-11 DIAGNOSIS — K7682 Hepatic encephalopathy: Secondary | ICD-10-CM | POA: Diagnosis not present

## 2022-10-12 DIAGNOSIS — I81 Portal vein thrombosis: Secondary | ICD-10-CM | POA: Diagnosis not present

## 2022-10-12 DIAGNOSIS — K7469 Other cirrhosis of liver: Secondary | ICD-10-CM | POA: Diagnosis not present

## 2022-10-12 DIAGNOSIS — N179 Acute kidney failure, unspecified: Secondary | ICD-10-CM | POA: Diagnosis not present

## 2022-10-12 DIAGNOSIS — J948 Other specified pleural conditions: Secondary | ICD-10-CM | POA: Diagnosis not present

## 2022-10-12 DIAGNOSIS — I8500 Esophageal varices without bleeding: Secondary | ICD-10-CM | POA: Diagnosis not present

## 2022-10-12 DIAGNOSIS — J90 Pleural effusion, not elsewhere classified: Secondary | ICD-10-CM | POA: Diagnosis not present

## 2022-10-12 DIAGNOSIS — K7682 Hepatic encephalopathy: Secondary | ICD-10-CM | POA: Diagnosis not present

## 2022-10-12 DIAGNOSIS — I129 Hypertensive chronic kidney disease with stage 1 through stage 4 chronic kidney disease, or unspecified chronic kidney disease: Secondary | ICD-10-CM | POA: Diagnosis not present

## 2022-10-12 DIAGNOSIS — N189 Chronic kidney disease, unspecified: Secondary | ICD-10-CM | POA: Diagnosis not present

## 2022-10-12 DIAGNOSIS — K219 Gastro-esophageal reflux disease without esophagitis: Secondary | ICD-10-CM | POA: Diagnosis not present

## 2022-10-12 DIAGNOSIS — R0902 Hypoxemia: Secondary | ICD-10-CM | POA: Diagnosis not present

## 2022-10-13 DIAGNOSIS — K7290 Hepatic failure, unspecified without coma: Secondary | ICD-10-CM | POA: Diagnosis not present

## 2022-10-13 DIAGNOSIS — I81 Portal vein thrombosis: Secondary | ICD-10-CM | POA: Diagnosis not present

## 2022-10-13 DIAGNOSIS — K7460 Unspecified cirrhosis of liver: Secondary | ICD-10-CM | POA: Diagnosis not present

## 2022-10-13 DIAGNOSIS — J90 Pleural effusion, not elsewhere classified: Secondary | ICD-10-CM | POA: Diagnosis not present

## 2022-10-18 ENCOUNTER — Telehealth: Payer: Self-pay

## 2022-10-18 NOTE — Transitions of Care (Post Inpatient/ED Visit) (Signed)
10/18/2022  Name: Shaun Weaver MRN: [id number] DOB: 09-16-1960  Today's TOC FU Call Status:    Attempted to reach the patient regarding the most recent Inpatient/ED visit.  Follow Up Plan: Additional outreach attempts will be made to reach the patient to complete the Transitions of Care (Post Inpatient/ED visit) call.   Alyse Low, RN, BA, [facility], CRRN [facility] Coordinator, Transition of Care Ph # [phone]

## 2022-10-21 ENCOUNTER — Telehealth: Payer: Self-pay

## 2022-10-21 NOTE — Transitions of Care (Post Inpatient/ED Visit) (Signed)
10/21/2022  Name: DEVRAJ BOAZ MRN: [id number] DOB: 11-08-1960  Today's TOC FU Call Status: Today's TOC FU Call Status:: Unsuccessful Call (2nd Attempt) Unsuccessful Call (2nd Attempt) Date: 10/21/22  Attempted to reach the patient regarding the most recent Inpatient/ED visit.  Follow Up Plan: Additional outreach attempts will be made to reach the patient to complete the Transitions of Care (Post Inpatient/ED visit) call.   Alyse Low, RN, BA, [facility], CRRN [facility] Coordinator, Transition of Care Ph # [phone]

## 2022-11-12 DIAGNOSIS — E782 Mixed hyperlipidemia: Secondary | ICD-10-CM | POA: Diagnosis not present

## 2022-11-12 DIAGNOSIS — R7302 Impaired glucose tolerance (oral): Secondary | ICD-10-CM | POA: Diagnosis not present

## 2022-11-25 DIAGNOSIS — R7989 Other specified abnormal findings of blood chemistry: Secondary | ICD-10-CM | POA: Diagnosis not present

## 2022-12-18 DIAGNOSIS — R4182 Altered mental status, unspecified: Secondary | ICD-10-CM | POA: Diagnosis not present

## 2022-12-18 DIAGNOSIS — L03213 Periorbital cellulitis: Secondary | ICD-10-CM | POA: Diagnosis not present

## 2022-12-18 DIAGNOSIS — D696 Thrombocytopenia, unspecified: Secondary | ICD-10-CM | POA: Diagnosis not present

## 2022-12-18 DIAGNOSIS — R296 Repeated falls: Secondary | ICD-10-CM | POA: Diagnosis not present

## 2022-12-18 DIAGNOSIS — G928 Other toxic encephalopathy: Secondary | ICD-10-CM | POA: Diagnosis not present

## 2022-12-18 DIAGNOSIS — E872 Acidosis, unspecified: Secondary | ICD-10-CM | POA: Diagnosis not present

## 2022-12-18 DIAGNOSIS — I8500 Esophageal varices without bleeding: Secondary | ICD-10-CM | POA: Diagnosis not present

## 2022-12-18 DIAGNOSIS — I81 Portal vein thrombosis: Secondary | ICD-10-CM | POA: Diagnosis not present

## 2022-12-18 DIAGNOSIS — R918 Other nonspecific abnormal finding of lung field: Secondary | ICD-10-CM | POA: Diagnosis not present

## 2022-12-18 DIAGNOSIS — E878 Other disorders of electrolyte and fluid balance, not elsewhere classified: Secondary | ICD-10-CM | POA: Diagnosis not present

## 2022-12-18 DIAGNOSIS — H1033 Unspecified acute conjunctivitis, bilateral: Secondary | ICD-10-CM | POA: Diagnosis not present

## 2022-12-18 DIAGNOSIS — E039 Hypothyroidism, unspecified: Secondary | ICD-10-CM | POA: Diagnosis not present

## 2022-12-18 DIAGNOSIS — K862 Cyst of pancreas: Secondary | ICD-10-CM | POA: Diagnosis not present

## 2022-12-18 DIAGNOSIS — E876 Hypokalemia: Secondary | ICD-10-CM | POA: Diagnosis not present

## 2022-12-18 DIAGNOSIS — Z1152 Encounter for screening for COVID-19: Secondary | ICD-10-CM | POA: Diagnosis not present

## 2022-12-18 DIAGNOSIS — D6859 Other primary thrombophilia: Secondary | ICD-10-CM | POA: Diagnosis not present

## 2022-12-18 DIAGNOSIS — K7682 Hepatic encephalopathy: Secondary | ICD-10-CM | POA: Diagnosis not present

## 2022-12-18 DIAGNOSIS — E785 Hyperlipidemia, unspecified: Secondary | ICD-10-CM | POA: Diagnosis not present

## 2022-12-18 DIAGNOSIS — K766 Portal hypertension: Secondary | ICD-10-CM | POA: Diagnosis not present

## 2022-12-18 DIAGNOSIS — K219 Gastro-esophageal reflux disease without esophagitis: Secondary | ICD-10-CM | POA: Diagnosis not present

## 2022-12-18 DIAGNOSIS — E440 Moderate protein-calorie malnutrition: Secondary | ICD-10-CM | POA: Diagnosis not present

## 2022-12-18 DIAGNOSIS — N189 Chronic kidney disease, unspecified: Secondary | ICD-10-CM | POA: Diagnosis not present

## 2022-12-18 DIAGNOSIS — F419 Anxiety disorder, unspecified: Secondary | ICD-10-CM | POA: Diagnosis not present

## 2022-12-18 DIAGNOSIS — N179 Acute kidney failure, unspecified: Secondary | ICD-10-CM | POA: Diagnosis not present

## 2022-12-18 DIAGNOSIS — J90 Pleural effusion, not elsewhere classified: Secondary | ICD-10-CM | POA: Diagnosis not present

## 2022-12-19 DIAGNOSIS — L03213 Periorbital cellulitis: Secondary | ICD-10-CM | POA: Diagnosis not present

## 2022-12-19 DIAGNOSIS — G928 Other toxic encephalopathy: Secondary | ICD-10-CM | POA: Diagnosis not present

## 2022-12-19 DIAGNOSIS — K828 Other specified diseases of gallbladder: Secondary | ICD-10-CM | POA: Diagnosis not present

## 2022-12-19 DIAGNOSIS — N179 Acute kidney failure, unspecified: Secondary | ICD-10-CM | POA: Diagnosis not present

## 2022-12-19 DIAGNOSIS — I81 Portal vein thrombosis: Secondary | ICD-10-CM | POA: Diagnosis not present

## 2022-12-19 DIAGNOSIS — J9601 Acute respiratory failure with hypoxia: Secondary | ICD-10-CM | POA: Diagnosis not present

## 2022-12-19 DIAGNOSIS — K7682 Hepatic encephalopathy: Secondary | ICD-10-CM | POA: Diagnosis not present

## 2022-12-19 DIAGNOSIS — K7460 Unspecified cirrhosis of liver: Secondary | ICD-10-CM | POA: Diagnosis not present

## 2022-12-19 DIAGNOSIS — I8500 Esophageal varices without bleeding: Secondary | ICD-10-CM | POA: Diagnosis not present

## 2022-12-19 DIAGNOSIS — R161 Splenomegaly, not elsewhere classified: Secondary | ICD-10-CM | POA: Diagnosis not present

## 2022-12-20 DIAGNOSIS — E878 Other disorders of electrolyte and fluid balance, not elsewhere classified: Secondary | ICD-10-CM | POA: Diagnosis not present

## 2022-12-20 DIAGNOSIS — K7682 Hepatic encephalopathy: Secondary | ICD-10-CM | POA: Diagnosis not present

## 2022-12-20 DIAGNOSIS — L03213 Periorbital cellulitis: Secondary | ICD-10-CM | POA: Diagnosis not present

## 2022-12-20 DIAGNOSIS — H1033 Unspecified acute conjunctivitis, bilateral: Secondary | ICD-10-CM | POA: Diagnosis not present

## 2022-12-21 DIAGNOSIS — C220 Liver cell carcinoma: Secondary | ICD-10-CM | POA: Diagnosis not present

## 2022-12-21 DIAGNOSIS — K766 Portal hypertension: Secondary | ICD-10-CM | POA: Diagnosis not present

## 2022-12-21 DIAGNOSIS — J9601 Acute respiratory failure with hypoxia: Secondary | ICD-10-CM | POA: Diagnosis not present

## 2022-12-21 DIAGNOSIS — G9341 Metabolic encephalopathy: Secondary | ICD-10-CM | POA: Diagnosis not present

## 2022-12-21 DIAGNOSIS — N179 Acute kidney failure, unspecified: Secondary | ICD-10-CM | POA: Diagnosis not present

## 2022-12-21 DIAGNOSIS — K7469 Other cirrhosis of liver: Secondary | ICD-10-CM | POA: Diagnosis not present

## 2022-12-21 DIAGNOSIS — L03213 Periorbital cellulitis: Secondary | ICD-10-CM | POA: Diagnosis not present

## 2022-12-21 DIAGNOSIS — I8500 Esophageal varices without bleeding: Secondary | ICD-10-CM | POA: Diagnosis not present

## 2022-12-22 DIAGNOSIS — K7682 Hepatic encephalopathy: Secondary | ICD-10-CM | POA: Diagnosis not present

## 2022-12-22 DIAGNOSIS — J9601 Acute respiratory failure with hypoxia: Secondary | ICD-10-CM | POA: Diagnosis not present

## 2022-12-22 DIAGNOSIS — D539 Nutritional anemia, unspecified: Secondary | ICD-10-CM | POA: Diagnosis not present

## 2022-12-23 DIAGNOSIS — R4182 Altered mental status, unspecified: Secondary | ICD-10-CM | POA: Diagnosis not present

## 2022-12-23 DIAGNOSIS — G9341 Metabolic encephalopathy: Secondary | ICD-10-CM | POA: Diagnosis not present

## 2022-12-23 DIAGNOSIS — J90 Pleural effusion, not elsewhere classified: Secondary | ICD-10-CM | POA: Diagnosis not present

## 2022-12-23 DIAGNOSIS — I8500 Esophageal varices without bleeding: Secondary | ICD-10-CM | POA: Diagnosis not present

## 2022-12-23 DIAGNOSIS — J9601 Acute respiratory failure with hypoxia: Secondary | ICD-10-CM | POA: Diagnosis not present

## 2022-12-23 DIAGNOSIS — L03213 Periorbital cellulitis: Secondary | ICD-10-CM | POA: Diagnosis not present

## 2022-12-24 DIAGNOSIS — K7682 Hepatic encephalopathy: Secondary | ICD-10-CM | POA: Diagnosis not present

## 2022-12-24 DIAGNOSIS — I8500 Esophageal varices without bleeding: Secondary | ICD-10-CM | POA: Diagnosis not present

## 2022-12-24 DIAGNOSIS — L03213 Periorbital cellulitis: Secondary | ICD-10-CM | POA: Diagnosis not present

## 2022-12-24 DIAGNOSIS — N179 Acute kidney failure, unspecified: Secondary | ICD-10-CM | POA: Diagnosis not present

## 2022-12-24 DIAGNOSIS — G928 Other toxic encephalopathy: Secondary | ICD-10-CM | POA: Diagnosis not present

## 2022-12-25 DIAGNOSIS — L03213 Periorbital cellulitis: Secondary | ICD-10-CM | POA: Diagnosis not present

## 2022-12-25 DIAGNOSIS — K7682 Hepatic encephalopathy: Secondary | ICD-10-CM | POA: Diagnosis not present

## 2022-12-25 DIAGNOSIS — J9601 Acute respiratory failure with hypoxia: Secondary | ICD-10-CM | POA: Diagnosis not present

## 2022-12-25 DIAGNOSIS — I8500 Esophageal varices without bleeding: Secondary | ICD-10-CM | POA: Diagnosis not present

## 2022-12-29 DIAGNOSIS — K219 Gastro-esophageal reflux disease without esophagitis: Secondary | ICD-10-CM | POA: Diagnosis not present

## 2022-12-29 DIAGNOSIS — K7682 Hepatic encephalopathy: Secondary | ICD-10-CM | POA: Diagnosis not present

## 2022-12-29 DIAGNOSIS — I81 Portal vein thrombosis: Secondary | ICD-10-CM | POA: Diagnosis not present

## 2022-12-29 DIAGNOSIS — E039 Hypothyroidism, unspecified: Secondary | ICD-10-CM | POA: Diagnosis not present

## 2022-12-29 DIAGNOSIS — F419 Anxiety disorder, unspecified: Secondary | ICD-10-CM | POA: Diagnosis not present

## 2023-05-17 ENCOUNTER — Other Ambulatory Visit: Payer: Self-pay | Admitting: Physician Assistant

## 2023-05-17 DIAGNOSIS — K7031 Alcoholic cirrhosis of liver with ascites: Secondary | ICD-10-CM

## 2023-05-24 ENCOUNTER — Ambulatory Visit
Admission: RE | Admit: 2023-05-24 | Discharge: 2023-05-24 | Disposition: A | Discharge status: Home / Self Care | Source: Ambulatory Visit | Attending: Physician Assistant | Admitting: Physician Assistant

## 2023-05-24 DIAGNOSIS — K7031 Alcoholic cirrhosis of liver with ascites: Secondary | ICD-10-CM | POA: Insufficient documentation

## 2023-05-24 MED ORDER — GADOBUTROL 1 MMOL/ML IV SOLN
10.0000 mL | Freq: Once | INTRAVENOUS | Status: AC | PRN
Start: 2023-05-24 — End: 2023-05-24
  Administered 2023-05-24: 10 mL via INTRAVENOUS

## 2023-12-23 ENCOUNTER — Ambulatory Visit: Admission: EM | Admit: 2023-12-23 | Discharge: 2023-12-23 | Disposition: A | Discharge status: Home / Self Care

## 2023-12-23 ENCOUNTER — Encounter: Payer: Self-pay | Admitting: Emergency Medicine

## 2023-12-23 DIAGNOSIS — L03115 Cellulitis of right lower limb: Secondary | ICD-10-CM | POA: Diagnosis not present

## 2023-12-23 DIAGNOSIS — S8992XA Unspecified injury of left lower leg, initial encounter: Secondary | ICD-10-CM | POA: Diagnosis not present

## 2023-12-23 DIAGNOSIS — R600 Localized edema: Secondary | ICD-10-CM

## 2023-12-23 DIAGNOSIS — S8002XA Contusion of left knee, initial encounter: Secondary | ICD-10-CM

## 2023-12-23 DIAGNOSIS — L03116 Cellulitis of left lower limb: Secondary | ICD-10-CM | POA: Diagnosis not present

## 2023-12-23 NOTE — ED Notes (Signed)
 Patient is being discharged from the Urgent Care and sent to the [facility] Emergency Department via private vehicle with friend . Per Jolan Host, PA, patient is in need of higher level of care due to Cellulitis in both lower legs. Patient is aware and verbalizes understanding of plan of care.  Vitals:   12/23/23 1235  BP: 113/73  Pulse: 82  Resp: 15  Temp: (!) 97.5 F (36.4 C)  SpO2: 95%

## 2023-12-23 NOTE — ED Triage Notes (Signed)
 Patient reports reports getting sunburn on his lower legs couple weeks ago.  Patient reports redness, swelling, pain and warmth in his lower legs for about a week.  Patient denies fevers.

## 2023-12-23 NOTE — ED Provider Notes (Signed)
 [facility]    CSN: [id number] Arrival date & time: 12/23/23  1219      History   Chief Complaint Chief Complaint  Patient presents with   Cellulitis    HPI Shaun Weaver is a 63 y.o. male with history of alcoholic cirrhosis, chronic back pain, GERD, CAD, hypertension, hypothyroidism, DVT, anxiety, hyperlipidemia, thrombocytopenia, and esophageal varices presents for bilateral lower extremity swelling and erythema over the past 5-7 days. Patient recently returned from a trip to [place] and returned yesterday. He reports getting sunburned while on vacation. He has also fallen a few times and scraped up his legs/knees. He fell directly onto the left knee at the airport yesterday. Has been unable to bear weight since. Reports progressively worsening erythema bilateral lower extremities (R>L). No fever, chills, sweats, chest pain, shortness of breath, numbness. No pustular drainage from wounds. No other concerns.  HPI  Past Medical History:  Diagnosis Date   Anxiety    Arthritis    Back pain    Cirrhosis, alcoholic ([facility])    Coronary artery disease    Deep vein thrombosis of portal vein 2014   [facility]   Environmental allergies    GERD (gastroesophageal reflux disease)    H/O alcohol abuse    Hypertension    Hypothyroidism    Thyroid disease     Patient Active Problem List   Diagnosis Date Noted   Normocytic anemia 07/28/2021   MSSA bacteremia 06/25/2021   Right knee pain 06/15/2021   Thrombocytopenia 06/13/2021   Hypoxia 06/12/2021   Sciatica of right side associated with disorder of lumbosacral spine 06/12/2021   Chronic bilateral low back pain without sciatica 12/14/2018   Retinal detachment of right eye with multiple breaks 07/24/2016   Esophageal varices without bleeding ([facility]) 05/01/2014   Portal vein thrombosis 12/18/2012   Alcoholic cirrhosis of liver ([facility]) 05/30/2012   Gastro-esophageal reflux disease without esophagitis 05/30/2012    Hyperlipemia 05/30/2012   Hypothyroidism, unspecified 05/30/2012   Hypertension, essential, benign 05/30/2012    Past Surgical History:  Procedure Laterality Date   ANKLE FRACTURE SURGERY Right    EYE SURGERY Right    Cataract Extraction with lens implant   FRACTURE SURGERY Bilateral    pinning right ankle   IR INJECT/THERA/INC NEEDLE/CATH/PLC EPI/LUMB/SAC W/IMG  06/16/2021   KNEE ARTHROSCOPY Left 02/26/2015   Procedure: ARTHROSCOPY Left  KNEE partial medial menisectomy;  Surgeon: Helayne Glenn, MD;  Location: [facility] ORS;  Service: Orthopedics;  Laterality: Left;   KNEE ARTHROSCOPY Right 03/31/2015   Procedure: ARTHROSCOPY KNEE;  Surgeon: Helayne Glenn, MD;  Location: [facility] ORS;  Service: Orthopedics;  Laterality: Right;       Home Medications    Prior to Admission medications   Medication Sig Start Date End Date Taking? Authorizing Provider  amitriptyline  (ELAVIL ) 25 MG tablet Take 25 mg by mouth at bedtime as needed for sleep.    [provider]  carvedilol  (COREG ) 6.25 MG tablet Take 6.25 mg by mouth 2 (two) times daily with a meal. 11/28/20   [provider]  ceFAZolin  (ANCEF ) 2-4 GM/100ML-% IVPB Inject 100 mLs (2 g total) into the vein every 8 (eight) hours. 06/28/21   Wouk, Devaughn Sayres, MD  ergocalciferol (VITAMIN D2) 50000 units capsule Take 50,000 Units by mouth once a week. Friday    [provider]  fluticasone  (FLONASE ) 50 MCG/ACT nasal spray Place 2 sprays into both nostrils daily.    [provider]  furosemide  (LASIX ) 20  MG tablet Take 20 mg by mouth daily. 04/18/21   [provider]  gabapentin  (NEURONTIN ) 300 MG capsule Take 600 mg by mouth 3 (three) times daily.    [provider]  HYDROcodone -acetaminophen  (NORCO /VICODIN) 5-325 MG tablet Take 1 tablet by mouth every 4 (four) hours as needed for severe pain or moderate pain. 06/28/21   Wouk, Devaughn Sayres, MD  levothyroxine  (SYNTHROID , LEVOTHROID) 100 MCG tablet Take  100 mcg by mouth daily before breakfast.    [provider]  magnesium  30 MG tablet Take 1 tablet (30 mg total) by mouth daily. 06/28/21   Wouk, Devaughn Sayres, MD  mupirocin  ointment (BACTROBAN ) 2 % Apply two times a day for 7 days. 09/28/16   Cleotilde Jacobsen, NP  omeprazole (PRILOSEC) 40 MG capsule Take 1 tablet by mouth daily. 06/17/21   [provider]  rivaroxaban  (XARELTO ) 10 MG TABS tablet Take 10 mg by mouth daily.    [provider]  traZODone  (DESYREL ) 50 MG tablet Take 50 mg by mouth as needed for sleep.    [provider]    Family History History reviewed. No pertinent family history.  Social History Social History   Tobacco Use   Smoking status: Never   Smokeless tobacco: Never  Vaping Use   Vaping status: Never Used  Substance Use Topics   Alcohol use: No   Drug use: No     Allergies   Patient has no known allergies.   Review of Systems Review of Systems  Constitutional:  Negative for fatigue and fever.  Respiratory:  Negative for shortness of breath.   Cardiovascular:  Positive for leg swelling. Negative for chest pain and palpitations.  Musculoskeletal:  Positive for arthralgias, gait problem and joint swelling.  Skin:  Positive for color change, rash and wound.  Neurological:  Negative for dizziness, weakness, numbness and headaches.     Physical Exam Triage Vital Signs ED Triage Vitals  Encounter Vitals Group     BP      Girls Systolic BP Percentile      Girls Diastolic BP Percentile      Boys Systolic BP Percentile      Boys Diastolic BP Percentile      Pulse      Resp      Temp      Temp src      SpO2      Weight      Height      Head Circumference      Peak Flow      Pain Score      Pain Loc      Pain Education      Exclude from Growth Chart    No data found.  Updated Vital Signs BP 113/73 (BP Location: Right Arm)   Pulse 82   Temp (!) 97.5 F (36.4 C) (Oral)   Resp 15   Ht 6' (1.829 m)   Wt  230 lb (104.3 kg)   SpO2 95%   BMI 31.19 kg/m   Physical Exam Vitals and nursing note reviewed.  Constitutional:      General: He is not in acute distress.    Appearance: Normal appearance. He is well-developed. He is not ill-appearing.  HENT:     Head: Normocephalic and atraumatic.  Eyes:     Conjunctiva/sclera: Conjunctivae normal.  Cardiovascular:     Rate and Rhythm: Normal rate and regular rhythm.  Pulmonary:     Effort: Pulmonary effort is normal.  No respiratory distress.     Breath sounds: Normal breath sounds.  Musculoskeletal:     Cervical back: Neck supple.  Skin:    General: Skin is warm and dry.     Capillary Refill: Capillary refill takes less than 2 seconds.     Findings: Erythema present.  Neurological:     General: No focal deficit present.     Mental Status: He is alert. Mental status is at baseline.     Motor: No weakness.     Gait: Gait normal.  Psychiatric:        Mood and Affect: Mood normal.        Behavior: Behavior normal.   RIGHT KNEE: See image below. Peeling skin, superficial open wound right anterior knee with mild swelling and erythema.   RIGHT LOWER LEG: See image below. Significant erythema with contusions/purpura circumferential ankle and distal lower leg. Edema RLE.  Swelling dorsal feet to knee.   LEFT KNEE/LOWER LEG: Significant contusion with swelling/hematoma, peeling skin with open wounds. Significant tenderness of knee with reduced ROM--difficult to evaluate knee due to pain/guarding. Petechial/purpuric rash and erythema anterior lower leg/ankle. Swelling dorsal feet to knee.    UC Treatments / Results  Labs (all labs ordered are listed, but only abnormal results are displayed) Labs Reviewed - No data to display  EKG   Radiology No results found.  Procedures Procedures (including critical care time)  Medications Ordered in UC Medications - No data to display  Initial Impression / Assessment and Plan / UC Course  I  have reviewed the triage vital signs and the nursing notes.  Pertinent labs & imaging results that were available during my care of the patient were reviewed by me and considered in my medical decision making (see chart for details).   63 y/o male with history of alcoholic cirrhosis, chronic back pain, GERD, CAD, hypertension, hypothyroidism, DVT, anxiety, hyperlipidemia, thrombocytopenia, and esophageal varices presents for bilateral lower extremity swelling and erythema over the past 5-7 days. Reports multiple recent falls with fall onto the left knee yesterday.  Vitals are all stable. See HPI. See images included in chart. Patient has cellulitis bilateral lower extremities with peripheral edema, petechial type rash. He also has hematoma left knee with significant reduced ROM.   Advised ED evaluation. Needs STAT labs, possibly IV antibiotics and imaging of left knee.    Final Clinical Impressions(s) / UC Diagnoses   Final diagnoses:  Cellulitis of right lower extremity  Cellulitis of left lower extremity  Injury of left knee, initial encounter  Hematoma of left knee region  Peripheral edema     Discharge Instructions      -Concern for significant infection and leg swelling -Likely needs labs, imaging of left knee and possibly IV antibiotics.   You have been advised to follow up immediately in the emergency department for concerning signs/symptoms. If you declined EMS transport, please have a family member take you directly to the ED at this time. Do not delay. Based on concerns about condition, if you do not follow up in th e ED, you may risk poor outcomes including worsening of condition, delayed treatment and potentially life threatening issues. If you have declined to go to the ED at this time, you should call your PCP immediately to set up a follow up appointment.  Go to ED for red flag symptoms, including; fevers you cannot reduce with Tylenol /Motrin, severe headaches, vision  changes, numbness/weakness in part of the body, lethargy, confusion, intractable vomiting,  severe dehydration, chest pain, breathing difficulty, severe persistent abdominal or pelvic pain, signs of severe infection (increased redness, swelling of an area), feeling faint or passing out, dizziness, etc. You should especially go to the ED for sudden acute worsening of condition if you do not elect to go at this time.      ED Prescriptions   None    PDMP not reviewed this encounter.   Arvis Jolan NOVAK, PA-C 12/23/23 1740

## 2023-12-23 NOTE — Discharge Instructions (Addendum)
-  Concern for significant infection and leg swelling -Likely needs labs, imaging of left knee and possibly IV antibiotics.   You have been advised to follow up immediately in the emergency department for concerning signs/symptoms. If you declined EMS transport, please have a family member take you directly to the ED at this time. Do not delay. Based on concerns about condition, if you do not follow up in th e ED, you may risk poor outcomes including worsening of condition, delayed treatment and potentially life threatening issues. If you have declined to go to the ED at this time, you should call your PCP immediately to set up a follow up appointment.  Go to ED for red flag symptoms, including; fevers you cannot reduce with Tylenol /Motrin, severe headaches, vision changes, numbness/weakness in part of the body, lethargy, confusion, intractable vomiting, severe dehydration, chest pain, breathing difficulty, severe persistent abdominal or pelvic pain, signs of severe infection (increased redness, swelling of an area), feeling faint or passing out, dizziness, etc. You should especially go to the ED for sudden acute worsening of condition if you do not elect to go at this time.

## 2024-05-18 IMAGING — MR MR LUMBAR SPINE WO/W CM
6 of 7 series · 34 of 48 positions shown · IV contrast (10ml Gadavist)
Comparison: 06/12/2021 MRI lumbar spine

CLINICAL DATA: Bacteremia, recent epidural injection

EXAM:
MRI LUMBAR SPINE WITHOUT AND WITH CONTRAST
TECHNIQUE: Multiplanar and multiecho pulse sequences of the lumbar spine were
obtained without and with intravenous contrast.
CONTRAST:  10mL GADAVIST GADOBUTROL 1 MMOL/ML IV SOLN

[Series 5: T2 · sagittal · 4.0mm · 1.02mm/px · 5 of 17 slices shown (1 of 2)]
[im 1/17]
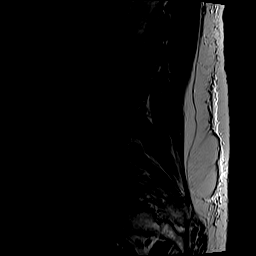
[im 5/17]
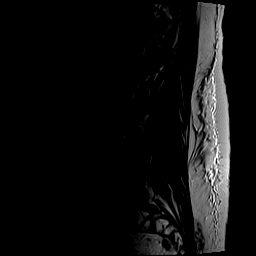
[im 9/17]
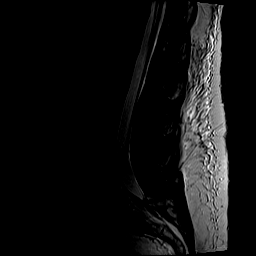
[im 13/17]
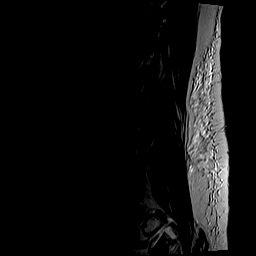
[im 17/17]
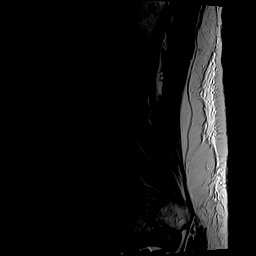

[Series 6: T1 · sagittal · 4.0mm · 1.02mm/px · 5 of 17 slices shown (1 of 2)]
[im 1/17]
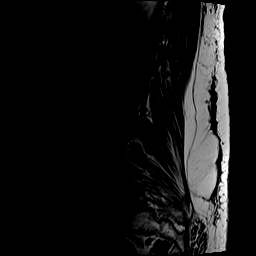
[im 5/17]
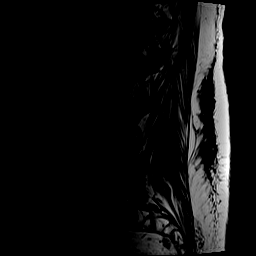
[im 9/17]
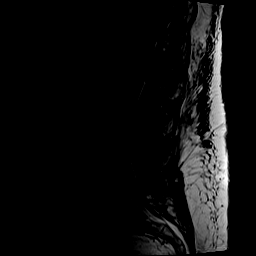
[im 13/17]
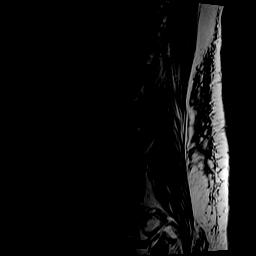
[im 17/17]
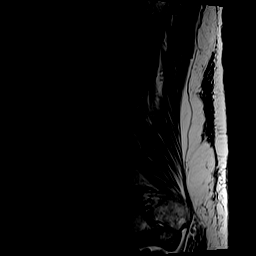

[Series 7: STIR · sagittal · 4.0mm · 0.51mm/px · 4 of 17 slices shown]
[im 1/17]
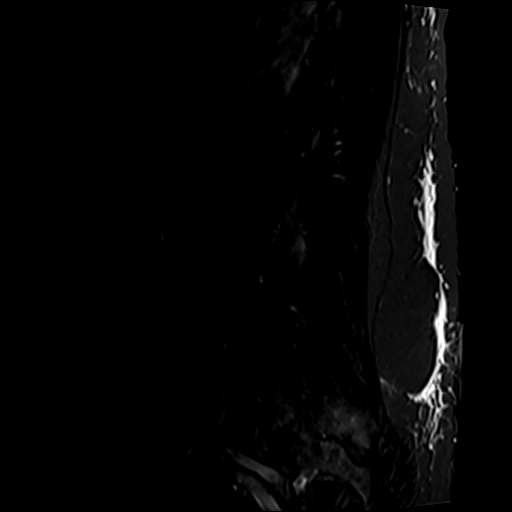
[im 6/17]
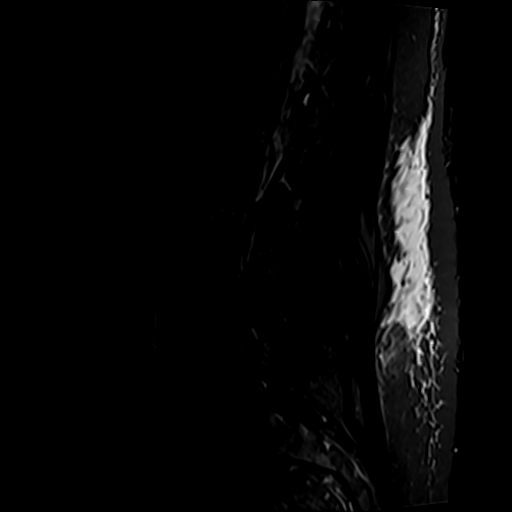
[im 11/17]
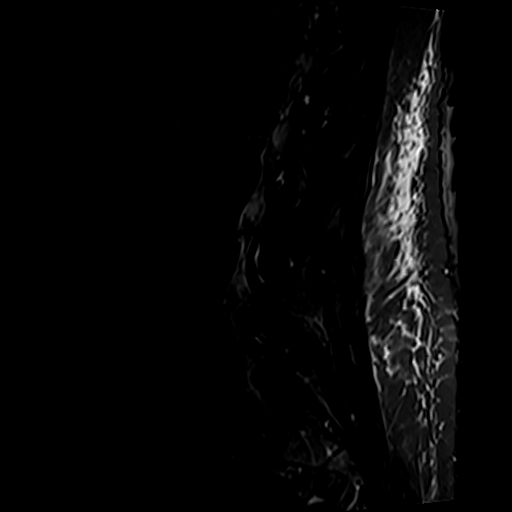
[im 17/17]
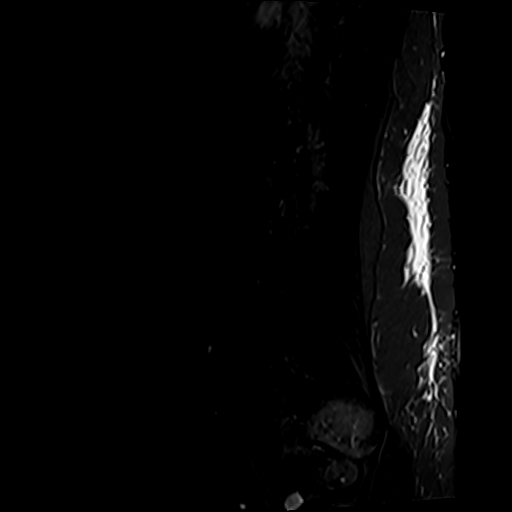

[Series 8: T2 · axial · 4.0mm · 0.78mm/px · z∈[-89,+161]mm · 8 of 43 slices shown (2 of 2)]
[im 1/43]
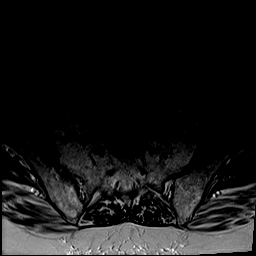
[im 5/43]
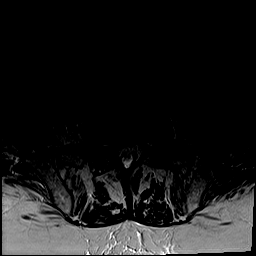
[im 15/43]
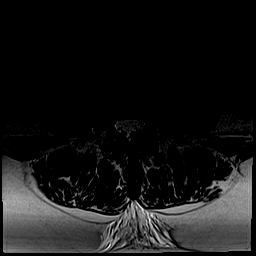
[im 19/43]
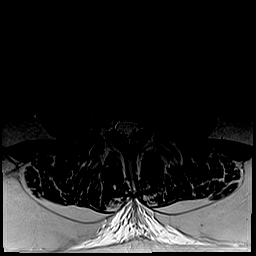
[im 24/43]
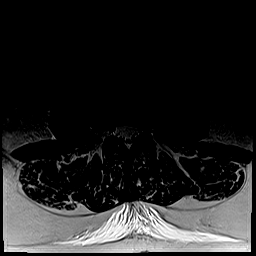
[im 29/43]
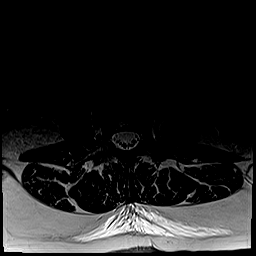
[im 38/43]
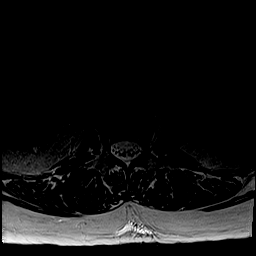
[im 43/43]
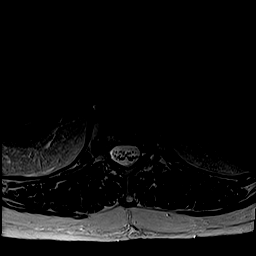

[Series 9: T1 · axial · 4.0mm · 0.39mm/px · z∈[-89,+161]mm · 8 of 43 slices shown (2 of 2)]
[im 1/43]
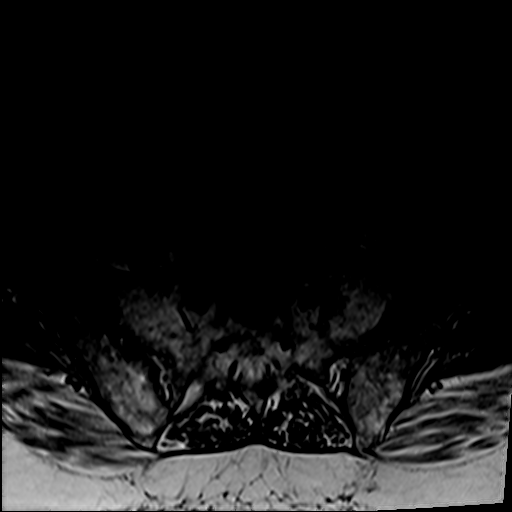
[im 5/43]
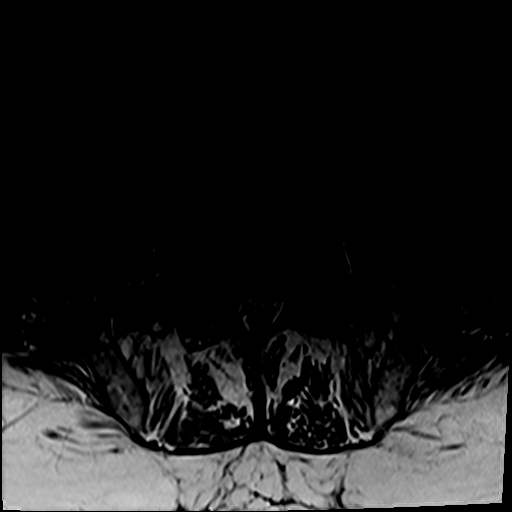
[im 15/43]
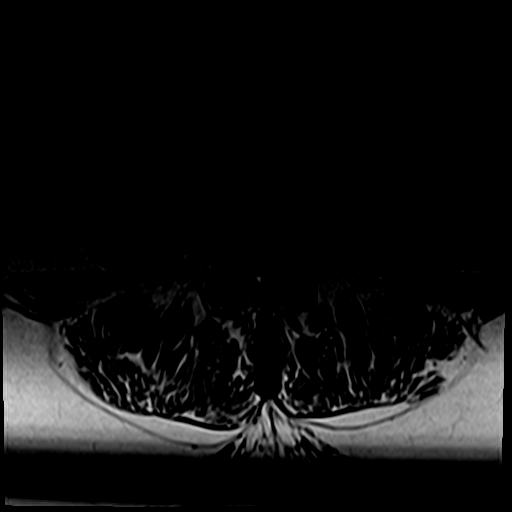
[im 19/43]
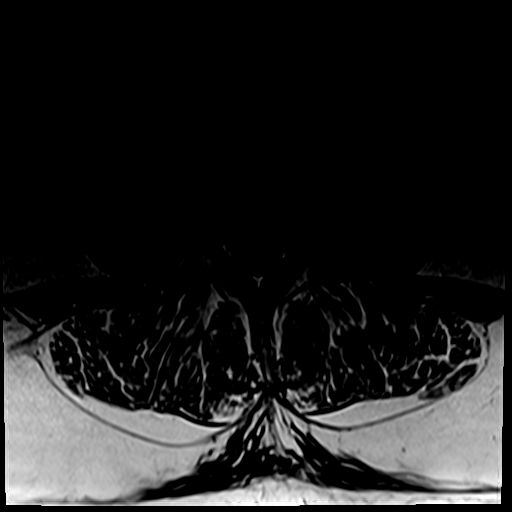
[im 24/43]
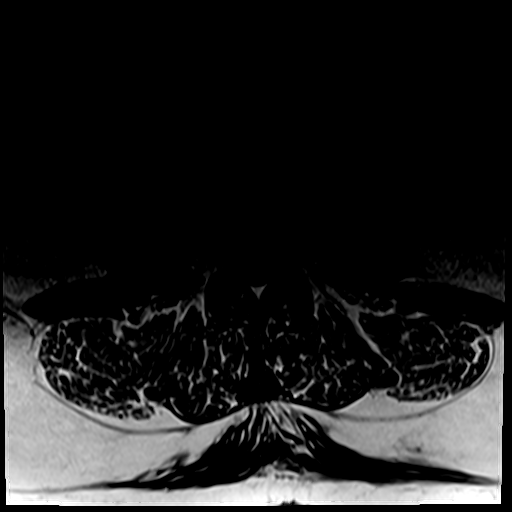
[im 29/43]
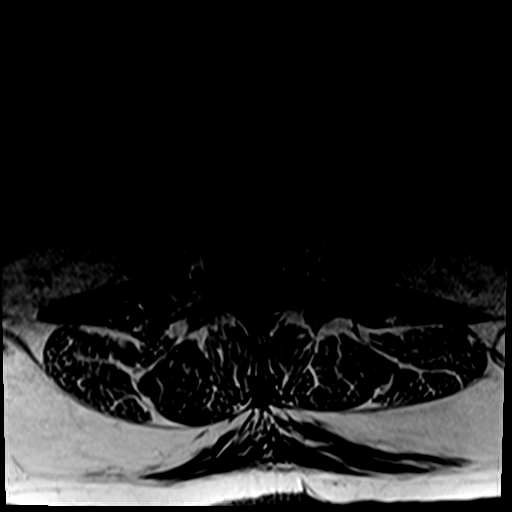
[im 38/43]
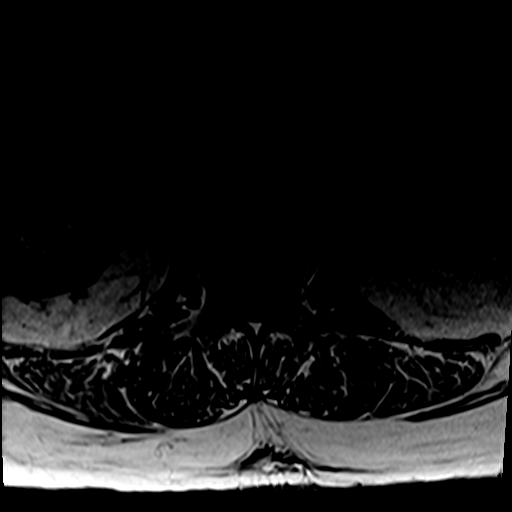
[im 43/43]
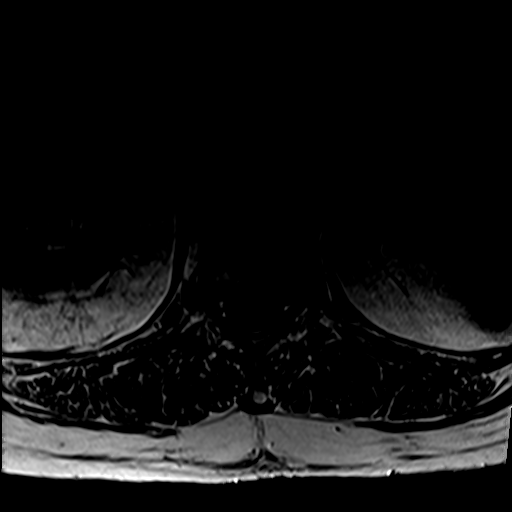

[Series 10: T1 fat-sat post-contrast · sagittal · 4.0mm · 1.02mm/px · 4 of 17 slices shown]
[im 1/17]
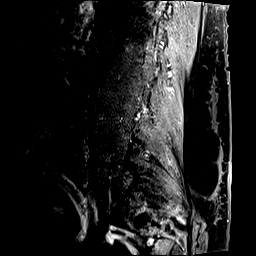
[im 6/17]
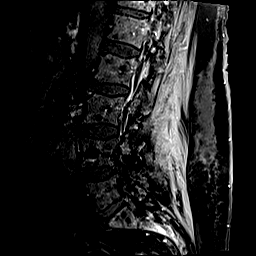
[im 11/17]
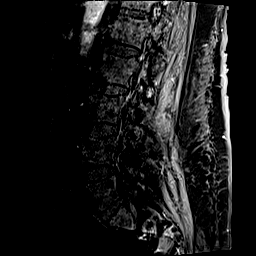
[im 17/17]
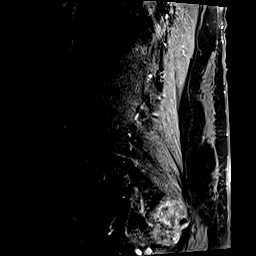

[34 of 48 positions shown; findings below may reference images not displayed]

FINDINGS: Segmentation:  Standard.

Alignment:  Physiologic.

Vertebrae: No acute fracture, evidence of discitis, or suspicious
osseous lesion. No abnormal osseous enhancement. Unchanged
appearance of the vertebral bodies compared to 06/12/2021.

Conus medullaris and cauda equina: Conus extends to the T12 level.
Conus and cauda equina appear normal. No abnormal enhancement.

Paraspinal and other soft tissues: Bilateral renal atrophy and
multiple small renal cysts.

Disc levels:

T12-L1: No significant disc bulge. No spinal canal stenosis or
neural foraminal narrowing.

L1-L2: No significant disc bulge. Mild facet arthropathy. No spinal
canal stenosis or neural foraminal narrowing.

L2-L3: No significant disc bulge. Mild facet arthropathy. No spinal
canal stenosis or neural foraminal narrowing.

L3-L4: No significant disc bulge. Mild facet arthropathy. No spinal
canal stenosis or neural foraminal narrowing.

L4-L5: No significant disc bulge. Moderate facet arthropathy.
Narrowing of the lateral recesses. No spinal canal stenosis or
neural foraminal narrowing.

L5-S1: Disc height loss with right eccentric disc bulge. Mild facet
arthropathy. Disc protrusion contacts the descending right S1 nerve
roots. No spinal canal stenosis. Mild right neural foraminal
narrowing.
IMPRESSION: 1. No evidence of discitis osteomyelitis. No acute osseous
abnormality.
2. L5-S1 disc protrusion that contacts the descending right S1 nerve
roots. Mild right neural foraminal narrowing at this level.
3. Narrowing of the lateral recesses at L4-L5, which could affect
the descending L5 nerve roots.
